# Patient Record
Sex: Male | Born: 1975 | Race: White | Hispanic: No | Marital: Married
Health system: Midwestern US, Community
[De-identification: ages and names within clinical notes are randomized; demographics above are authoritative.]

## PROBLEM LIST (undated history)

## (undated) DIAGNOSIS — J45909 Unspecified asthma, uncomplicated: Secondary | ICD-10-CM

## (undated) DIAGNOSIS — I739 Peripheral vascular disease, unspecified: Secondary | ICD-10-CM

## (undated) DIAGNOSIS — D689 Coagulation defect, unspecified: Secondary | ICD-10-CM

## (undated) DIAGNOSIS — E119 Type 2 diabetes mellitus without complications: Secondary | ICD-10-CM

## (undated) DIAGNOSIS — G709 Myoneural disorder, unspecified: Secondary | ICD-10-CM

## (undated) DIAGNOSIS — Z832 Family history of diseases of the blood and blood-forming organs and certain disorders involving the immune mechanism: Secondary | ICD-10-CM

## (undated) DIAGNOSIS — Z5189 Encounter for other specified aftercare: Secondary | ICD-10-CM

## (undated) DIAGNOSIS — D6851 Activated protein C resistance: Secondary | ICD-10-CM

## (undated) HISTORY — PX: APPENDECTOMY: SHX54

## (undated) HISTORY — DX: Type 2 diabetes mellitus without complications: E11.9

## (undated) HISTORY — PX: COLONOSCOPY: SHX174

## (undated) HISTORY — PX: WISDOM TOOTH EXTRACTION: SHX21

## (undated) HISTORY — DX: Myoneural disorder, unspecified: G70.9

## (undated) HISTORY — DX: Coagulation defect, unspecified: D68.9

## (undated) HISTORY — DX: Activated protein C resistance: D68.51

## (undated) HISTORY — DX: Encounter for other specified aftercare: Z51.89

---

## 2004-02-27 ENCOUNTER — Emergency Department (HOSPITAL_COMMUNITY): Admission: EM | Admit: 2004-02-27 | Discharge: 2004-02-27 | Payer: Self-pay | Admitting: Emergency Medicine

## 2006-08-31 ENCOUNTER — Emergency Department (HOSPITAL_COMMUNITY): Admission: EM | Admit: 2006-08-31 | Discharge: 2006-08-31 | Payer: Self-pay | Admitting: Emergency Medicine

## 2007-02-03 ENCOUNTER — Emergency Department (HOSPITAL_COMMUNITY): Admission: EM | Admit: 2007-02-03 | Discharge: 2007-02-03 | Payer: Self-pay | Admitting: Emergency Medicine

## 2008-12-30 ENCOUNTER — Emergency Department (HOSPITAL_COMMUNITY): Admission: EM | Admit: 2008-12-30 | Discharge: 2008-12-30 | Payer: Self-pay | Admitting: Emergency Medicine

## 2010-01-13 ENCOUNTER — Emergency Department (HOSPITAL_COMMUNITY): Admission: EM | Admit: 2010-01-13 | Discharge: 2010-01-13 | Payer: Self-pay | Admitting: Emergency Medicine

## 2010-05-25 IMAGING — CR DG LUMBAR SPINE COMPLETE 4+V
5 series · 5 of 5 positions shown · non-contrast
Comparison: None.

CLINICAL DATA: Low back pain following a lifting injury.

LUMBAR SPINE - COMPLETE 4+ VIEW

[view not recorded (1 of 5)]
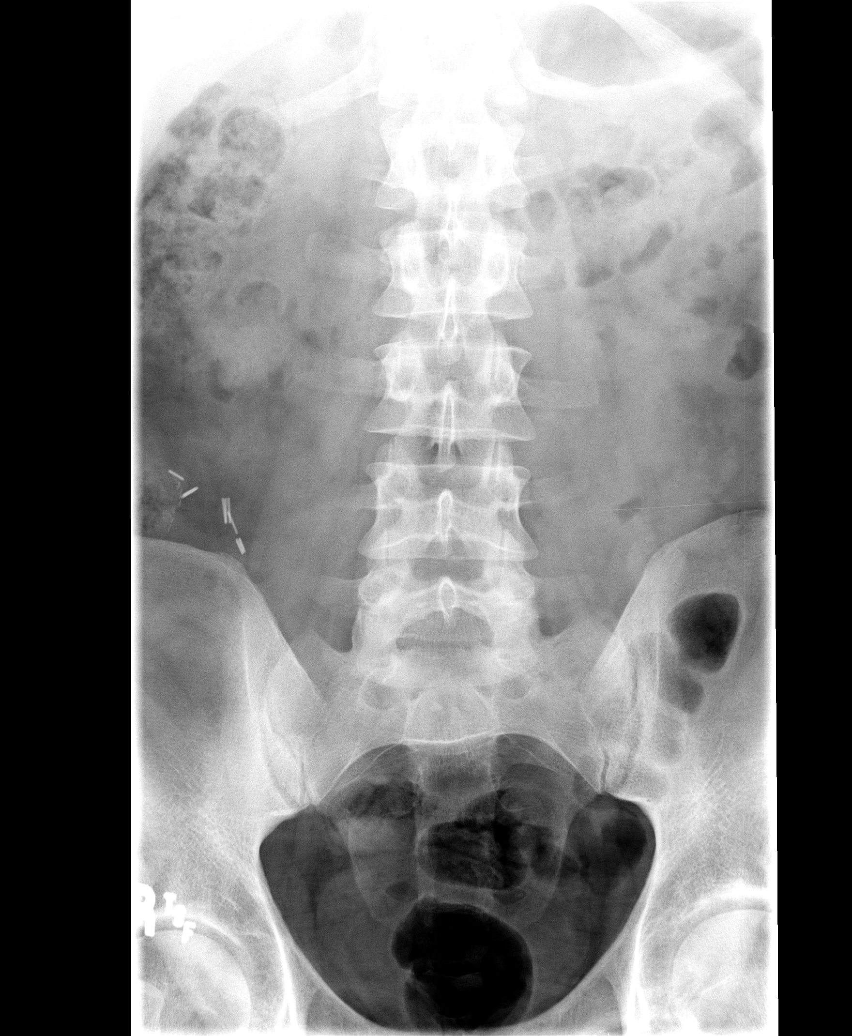

[view not recorded (2 of 5)]
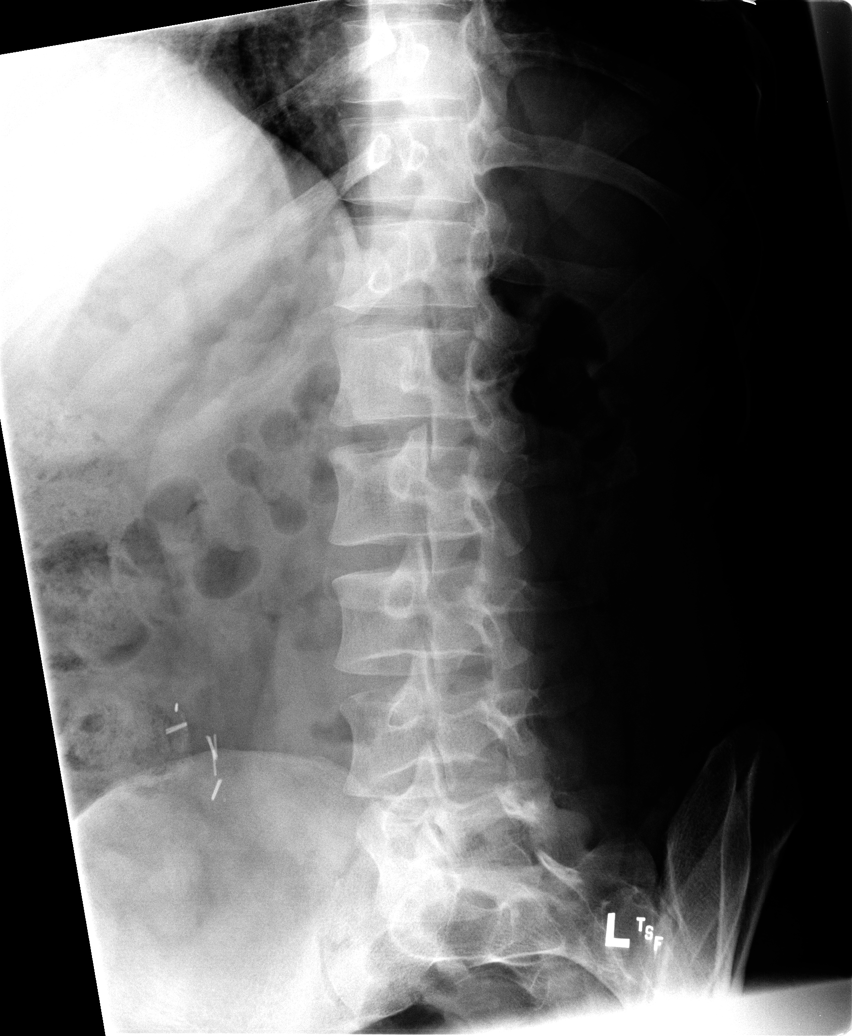

[view not recorded (3 of 5)]
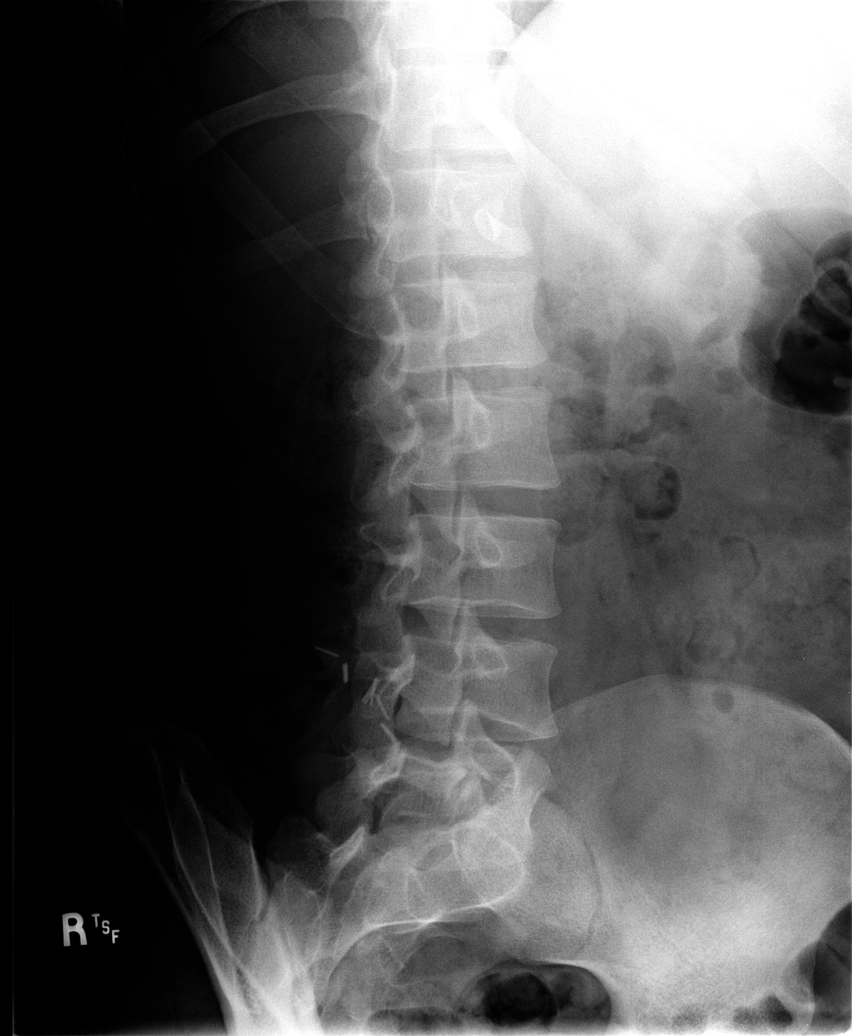

[view not recorded (4 of 5)]
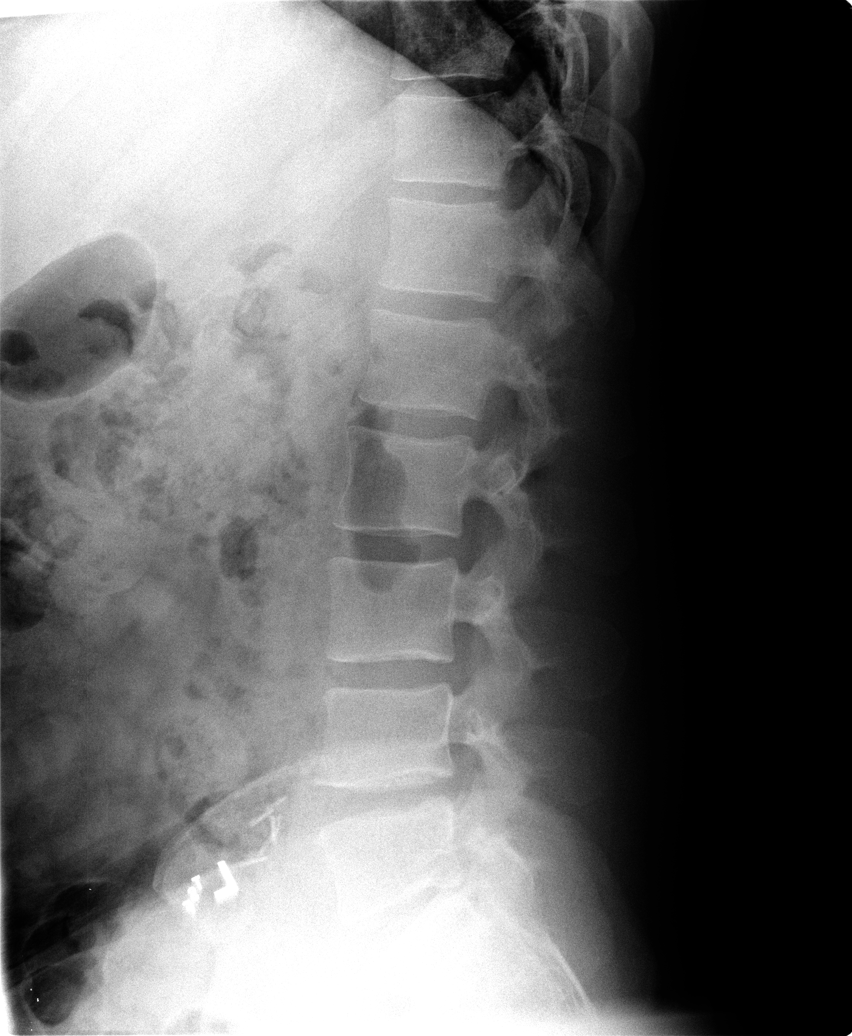

[view not recorded (5 of 5)]
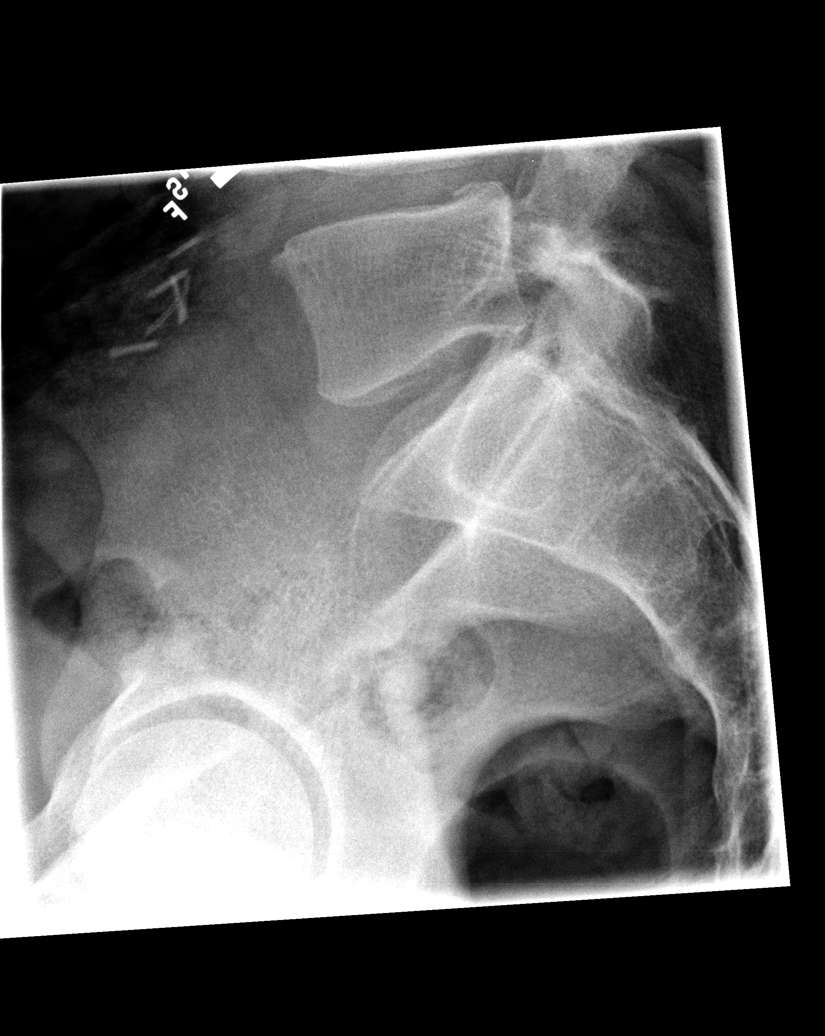

[5 of 5 positions shown; findings below may reference images not displayed]

FINDINGS: Five non-rib bearing lumbar vertebrae.  Mild anterior
spur formation at the L4-5 level.  No fractures, pars defects or
subluxations.  Right lower quadrant abdominal surgical clips.
IMPRESSION: No fracture or subluxation.

## 2013-05-21 ENCOUNTER — Encounter: Payer: Self-pay | Admitting: Family Medicine

## 2013-05-21 ENCOUNTER — Ambulatory Visit (INDEPENDENT_AMBULATORY_CARE_PROVIDER_SITE_OTHER): Payer: BC Managed Care – PPO | Admitting: Family Medicine

## 2013-05-21 ENCOUNTER — Ambulatory Visit (INDEPENDENT_AMBULATORY_CARE_PROVIDER_SITE_OTHER): Payer: BC Managed Care – PPO

## 2013-05-21 ENCOUNTER — Encounter (INDEPENDENT_AMBULATORY_CARE_PROVIDER_SITE_OTHER): Payer: Self-pay

## 2013-05-21 VITALS — BP 134/85 | HR 78 | Temp 98.4°F | Ht 73.5 in | Wt 241.0 lb

## 2013-05-21 DIAGNOSIS — M25519 Pain in unspecified shoulder: Secondary | ICD-10-CM

## 2013-05-21 DIAGNOSIS — M25511 Pain in right shoulder: Secondary | ICD-10-CM

## 2013-05-21 MED ORDER — NAPROXEN 500 MG PO TABS: 500.0000 mg | ORAL_TABLET | Freq: Two times a day (BID) | ORAL | Status: DC

## 2013-05-21 MED ORDER — KETOROLAC TROMETHAMINE 30 MG/ML IJ SOLN
30.0000 mg | Freq: Once | INTRAMUSCULAR | Status: AC
  Administered 2013-05-21: 30 mg via INTRAMUSCULAR

## 2013-05-21 NOTE — Addendum Note (Signed)
Addended by: Bearl Mulberry on: 05/21/2013 05:23 PM   Modules accepted: Orders

## 2013-05-21 NOTE — Progress Notes (Signed)
  Subjective:    Patient ID: Bryan Hull, male    DOB: April 10, 1976, 37 y.o.   MRN: [id number]  HPI This 37 y.o. male presents for evaluation of right shoulder pain.  He states he has  Hx of right shoulder injury in the past and he has been having some persistent right Shoulder pain and he has a knot on his right shoulder that is tender.  He states the pain In his shoulder bothers him at night and he is getting aggravated by the discomfort.   Review of Systems C/o right shoulder pain. No chest pain, SOB, HA, dizziness, vision change, N/V, diarrhea, constipation, dysuria, urinary urgency or frequency or rash.     Objective:   Physical Exam Vital signs noted  Well developed well nourished male.  HEENT - Head atraumatic Normocephalic Respiratory - Lungs CTA bilateral Cardiac - RRR S1 and S2 without murmur GI - Abdomen soft Nontender and bowel sounds active x 4 Extremities - No edema. Neuro - Grossly intact. MS -  TTP right shoulder at [facility] joint.  AC joint with swelling.   Xray right shoulder - No acute fracture and no dislocation.    Assessment & Plan:  Right shoulder pain - Plan: DG Shoulder Right Toradol 60mg  IM Naprosyn 500mg  one po bid x 10 days #30  Follow up prn.  Deatra Canter FNP

## 2013-05-21 NOTE — Patient Instructions (Signed)
Shoulder Pain  The shoulder is the joint that connects your arms to your body. The bones that form the shoulder joint include the upper arm bone (humerus), the shoulder blade (scapula), and the collarbone (clavicle). The top of the humerus is shaped like a ball and fits into a rather flat socket on the scapula (glenoid cavity). A combination of muscles and strong, fibrous tissues that connect muscles to bones (tendons) support your shoulder joint and hold the ball in the socket. Small, fluid-filled sacs (bursae) are located in different areas of the joint. They act as cushions between the bones and the overlying soft tissues and help reduce friction between the gliding tendons and the bone as you move your arm. Your shoulder joint allows a wide range of motion in your arm. This range of motion allows you to do things like scratch your back or throw a ball. However, this range of motion also makes your shoulder more prone to pain from overuse and injury.  Causes of shoulder pain can originate from both injury and overuse and usually can be grouped in the following four categories:   Redness, swelling, and pain (inflammation) of the tendon (tendinitis) or the bursae (bursitis).   Instability, such as a dislocation of the joint.   Inflammation of the joint (arthritis).   Broken bone (fracture).  HOME CARE INSTRUCTIONS    Apply ice to the sore area.   Put ice in a plastic bag.   Place a towel between your skin and the bag.   Leave the ice on for 15-20 minutes, 3-4 times per day for the first 2 days.   Stop using cold packs if they do not help with the pain.   If you have a shoulder sling or immobilizer, wear it as long as your caregiver instructs. Only remove it to shower or bathe. Move your arm as little as possible, but keep your hand moving to prevent swelling.   Squeeze a soft ball or foam pad as much as possible to help prevent swelling.   Only take over-the-counter or prescription medicines for pain,  discomfort, or fever as directed by your caregiver.  SEEK MEDICAL CARE IF:    Your shoulder pain increases, or new pain develops in your arm, hand, or fingers.   Your hand or fingers become cold and numb.   Your pain is not relieved with medicines.  SEEK IMMEDIATE MEDICAL CARE IF:    Your arm, hand, or fingers are numb or tingling.   Your arm, hand, or fingers are significantly swollen or turn white or blue.  MAKE SURE YOU:    Understand these instructions.   Will watch your condition.   Will get help right away if you are not doing well or get worse.  Document Released: 04/24/2005 Document Revised: 04/08/2012 Document Reviewed: 06/29/2011  [employer] Patient Information 2014 [employer].

## 2013-05-28 ENCOUNTER — Telehealth: Payer: Self-pay | Admitting: Family Medicine

## 2013-05-31 NOTE — Telephone Encounter (Signed)
PT SAID NOT W/C. NEEDS PAPER FILLED OUT TO RETURN TO WORK. HAS THIS BEEN DONE?

## 2021-05-08 ENCOUNTER — Inpatient Hospital Stay
Admit: 2021-05-08 | Discharge: 2021-05-08 | Disposition: A | Discharge status: Home / Self Care | Payer: MEDICAID | Attending: Emergency Medicine

## 2021-05-08 DIAGNOSIS — G8929 Other chronic pain: Secondary | ICD-10-CM

## 2021-05-08 MED ORDER — METHYLPREDNISOLONE 40 MG/ML SUSP FOR INJECTION
40 mg/mL | Freq: Once | INTRAMUSCULAR | Status: AC
Start: 2021-05-08 — End: 2021-05-08
  Administered 2021-05-08: 13:00:00 via INTRAMUSCULAR

## 2021-05-08 MED ORDER — IBUPROFEN 800 MG TAB
800 mg | ORAL_TABLET | Freq: Three times a day (TID) | ORAL | 0 refills | Status: AC | PRN
Start: 2021-05-08 — End: 2021-05-15

## 2021-05-08 MED FILL — METHYLPREDNISOLONE 40 MG/ML SUSP FOR INJECTION: 40 mg/mL | INTRAMUSCULAR | Qty: 2

## 2021-05-08 NOTE — ED Provider Notes (Signed)
HPI 45 year old male with no significant medical history presents the ED complaining of multiple joint pains involving hips knees ankles primarily though some stiffness also noted in his shoulders.  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  No prior history of rheumatoid arthritis or other recognized connective tissue disorder.  No family history of connective tissue disorder polyarthropathies.  Still no fever no trauma.  Patient has had occasional flareups like this in the past though this pain seems more severe and ongoing now for approximately 10 days.  No respiratory GI GU symptoms.    No past medical history on file.    No past surgical history on file.      No family history on file.    Social History     Socioeconomic History    Marital status: MARRIED     Spouse name: Not on file    Number of children: Not on file    Years of education: Not on file    Highest education level: Not on file   Occupational History    Not on file   Tobacco Use    Smoking status: Not on file    Smokeless tobacco: Not on file   Substance and Sexual Activity    Alcohol use: Not on file    Drug use: Not on file    Sexual activity: Not on file   Other Topics Concern    Not on file   Social History Narrative    Not on file     Social Determinants of Health     Financial Resource Strain: Not on file   Food Insecurity: Not on file   Transportation Needs: Not on file   Physical Activity: Not on file   Stress: Not on file   Social Connections: Not on file   Intimate Partner Violence: Not on file   Housing Stability: Not on file         ALLERGIES: Patient has no known allergies.    Review of Systems   Constitutional:  Negative for appetite change, chills, diaphoresis and fever.   HENT:  Negative for ear pain, facial swelling, sinus pain and trouble swallowing.    Eyes:  Negative for redness and visual disturbance.   Respiratory:  Negative for cough, choking, chest tightness, shortness of  breath, wheezing and stridor.    Cardiovascular:  Negative for chest pain, palpitations and leg swelling.   Gastrointestinal:  Negative for abdominal distention, abdominal pain, blood in stool, diarrhea, nausea and vomiting.   Endocrine: Negative for polydipsia and polyuria.   Genitourinary:  Negative for difficulty urinating, dysuria, flank pain, frequency, hematuria and urgency.   Musculoskeletal:  Positive for arthralgias. Negative for back pain, gait problem, joint swelling, myalgias, neck pain and neck stiffness.   Allergic/Immunologic: Negative.    Neurological: Negative.    Psychiatric/Behavioral: Negative.       Vitals:    05/08/21 0911   BP: (!) 142/88   Pulse: 74   Resp: 16   Temp: 98.1 ??F (36.7 ??C)   SpO2: 99%   Weight: 99.8 kg (220 lb)   Height: 5\' 8"  (1.727 m)            Physical Exam  Vitals and nursing note reviewed.   Constitutional:       General: He is not in acute distress.     Appearance: Normal appearance. He is not ill-appearing, toxic-appearing or diaphoretic.  HENT:      Head: Normocephalic and atraumatic.      Nose: Nose normal.      Mouth/Throat:      Mouth: Mucous membranes are moist.   Eyes:      Extraocular Movements: Extraocular movements intact.      Conjunctiva/sclera: Conjunctivae normal.   Cardiovascular:      Rate and Rhythm: Normal rate and regular rhythm.      Pulses: Normal pulses.      Heart sounds: Normal heart sounds. No murmur heard.  Pulmonary:      Effort: Pulmonary effort is normal. No respiratory distress.      Breath sounds: Normal breath sounds. No wheezing, rhonchi or rales.   Abdominal:      General: Bowel sounds are normal. There is no distension.      Palpations: Abdomen is soft. There is no mass.      Tenderness: There is no abdominal tenderness. There is no right CVA tenderness, left CVA tenderness, guarding or rebound.      Hernia: No hernia is present.   Musculoskeletal:         General: Tenderness present. No swelling, deformity or signs of injury. Normal  range of motion.      Cervical back: Normal range of motion and neck supple. No rigidity or tenderness.      Right lower leg: No edema.      Left lower leg: No edema.      Comments: Close examination of the shoulders hips knees and ankles showed no evidence of joint effusion or active joint inflammation; however there are tenderness of the lateral hips bilaterally pain with flexion internal and external rotation and minor tenderness to the Achilles tendon bilaterally; no warmth or skin changes is noted no palpable crepitus   Lymphadenopathy:      Cervical: No cervical adenopathy.   Skin:     General: Skin is warm and dry.      Capillary Refill: Capillary refill takes less than 2 seconds.      Findings: No erythema, lesion or rash.   Neurological:      General: No focal deficit present.      Mental Status: He is alert and oriented to person, place, and time. Mental status is at baseline.      Cranial Nerves: No cranial nerve deficit.      Sensory: No sensory deficit.      Motor: No weakness.      Coordination: Coordination normal.   Psychiatric:         Mood and Affect: Mood normal.         Behavior: Behavior normal.         Thought Content: Thought content normal.        MDM  Diffuse arthralgias and minor findings of inflammation of the joints and tendons.  Clinically find no sign of a systemic connective tissue disorder though further work-up is warranted if patient does not respond ibuprofen and IM Depo-Medrol strongly encouraged him to seek rheumatology evaluation if symptoms persist.    We will treat with IM Depo-Medrol and Motrin 800 twice daily to 3 times daily       Procedures

## 2021-05-08 NOTE — ED Notes (Signed)
Patient reports pain in all of his joint in the lower part of his body.  Reports sx present x2 weeks & just got worse.

## 2021-05-08 NOTE — ED Triage Notes (Signed)
Formatting of this note might be different from the original.  Patient reports pain in all of his joint in the lower part of his body.  Reports sx present x2 weeks & just got worse.    Electronically signed by Jones Bales, RN at 05/08/2021  9:11 AM EDT

## 2021-05-08 NOTE — ED Provider Notes (Signed)
Formatting of this note is different from the original.  HPI 45 year old male with no significant medical history presents the ED complaining of multiple joint pains involving hips knees ankles primarily though some stiffness also noted in his shoulders.  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  No prior history of rheumatoid arthritis or other recognized connective tissue disorder.  No family history of connective tissue disorder polyarthropathies.  Still no fever no trauma.  Patient has had occasional flareups like this in the past though this pain seems more severe and ongoing now for approximately 10 days.  No respiratory GI GU symptoms.    No past medical history on file.    No past surgical history on file.    No family history on file.    Social History     Socioeconomic History    Marital status: MARRIED     Spouse name: Not on file    Number of children: Not on file    Years of education: Not on file    Highest education level: Not on file   Occupational History    Not on file   Tobacco Use    Smoking status: Not on file    Smokeless tobacco: Not on file   Substance and Sexual Activity    Alcohol use: Not on file    Drug use: Not on file    Sexual activity: Not on file   Other Topics Concern    Not on file   Social History Narrative    Not on file     Social Determinants of Health     Financial Resource Strain: Not on file   Food Insecurity: Not on file   Transportation Needs: Not on file   Physical Activity: Not on file   Stress: Not on file   Social Connections: Not on file   Intimate Partner Violence: Not on file   Housing Stability: Not on file     ALLERGIES: Patient has no known allergies.    Review of Systems   Constitutional:  Negative for appetite change, chills, diaphoresis and fever.   HENT:  Negative for ear pain, facial swelling, sinus pain and trouble swallowing.    Eyes:  Negative for redness and visual disturbance.   Respiratory:   Negative for cough, choking, chest tightness, shortness of breath, wheezing and stridor.    Cardiovascular:  Negative for chest pain, palpitations and leg swelling.   Gastrointestinal:  Negative for abdominal distention, abdominal pain, blood in stool, diarrhea, nausea and vomiting.   Endocrine: Negative for polydipsia and polyuria.   Genitourinary:  Negative for difficulty urinating, dysuria, flank pain, frequency, hematuria and urgency.   Musculoskeletal:  Positive for arthralgias. Negative for back pain, gait problem, joint swelling, myalgias, neck pain and neck stiffness.   Allergic/Immunologic: Negative.    Neurological: Negative.    Psychiatric/Behavioral: Negative.       Vitals:    05/08/21 0911   BP: (!) 142/88   Pulse: 74   Resp: 16   Temp: 98.1 F (36.7 C)   SpO2: 99%   Weight: 99.8 kg (220 lb)   Height: 5\' 8"  (1.727 m)       Physical Exam  Vitals and nursing note reviewed.   Constitutional:       General: He is not in acute distress.     Appearance: Normal appearance. He is not ill-appearing, toxic-appearing or diaphoretic.   HENT:  Head: Normocephalic and atraumatic.      Nose: Nose normal.      Mouth/Throat:      Mouth: Mucous membranes are moist.   Eyes:      Extraocular Movements: Extraocular movements intact.      Conjunctiva/sclera: Conjunctivae normal.   Cardiovascular:      Rate and Rhythm: Normal rate and regular rhythm.      Pulses: Normal pulses.      Heart sounds: Normal heart sounds. No murmur heard.  Pulmonary:      Effort: Pulmonary effort is normal. No respiratory distress.      Breath sounds: Normal breath sounds. No wheezing, rhonchi or rales.   Abdominal:      General: Bowel sounds are normal. There is no distension.      Palpations: Abdomen is soft. There is no mass.      Tenderness: There is no abdominal tenderness. There is no right CVA tenderness, left CVA tenderness, guarding or rebound.      Hernia: No hernia is present.   Musculoskeletal:         General: Tenderness present.  No swelling, deformity or signs of injury. Normal range of motion.      Cervical back: Normal range of motion and neck supple. No rigidity or tenderness.      Right lower leg: No edema.      Left lower leg: No edema.      Comments: Close examination of the shoulders hips knees and ankles showed no evidence of joint effusion or active joint inflammation; however there are tenderness of the lateral hips bilaterally pain with flexion internal and external rotation and minor tenderness to the Achilles tendon bilaterally; no warmth or skin changes is noted no palpable crepitus   Lymphadenopathy:      Cervical: No cervical adenopathy.   Skin:     General: Skin is warm and dry.      Capillary Refill: Capillary refill takes less than 2 seconds.      Findings: No erythema, lesion or rash.   Neurological:      General: No focal deficit present.      Mental Status: He is alert and oriented to person, place, and time. Mental status is at baseline.      Cranial Nerves: No cranial nerve deficit.      Sensory: No sensory deficit.      Motor: No weakness.      Coordination: Coordination normal.   Psychiatric:         Mood and Affect: Mood normal.         Behavior: Behavior normal.         Thought Content: Thought content normal.       MDM  Diffuse arthralgias and minor findings of inflammation of the joints and tendons.  Clinically find no sign of a systemic connective tissue disorder though further work-up is warranted if patient does not respond ibuprofen and IM Depo-Medrol strongly encouraged him to seek rheumatology evaluation if symptoms persist.    We will treat with IM Depo-Medrol and Motrin 800 twice daily to 3 times daily      Procedures          Electronically signed by Addison Lank, MD at 05/08/2021  9:25 AM EDT

## 2022-02-14 ENCOUNTER — Ambulatory Visit (INDEPENDENT_AMBULATORY_CARE_PROVIDER_SITE_OTHER): Payer: 59 | Admitting: Physician Assistant

## 2022-02-14 ENCOUNTER — Encounter: Payer: Self-pay | Admitting: Physician Assistant

## 2022-02-14 VITALS — BP 133/88 | HR 80 | Temp 97.9°F | Ht 73.0 in | Wt 228.4 lb

## 2022-02-14 DIAGNOSIS — I739 Peripheral vascular disease, unspecified: Secondary | ICD-10-CM | POA: Diagnosis not present

## 2022-02-14 DIAGNOSIS — E1140 Type 2 diabetes mellitus with diabetic neuropathy, unspecified: Secondary | ICD-10-CM | POA: Diagnosis not present

## 2022-02-14 DIAGNOSIS — F17210 Nicotine dependence, cigarettes, uncomplicated: Secondary | ICD-10-CM

## 2022-02-14 DIAGNOSIS — E119 Type 2 diabetes mellitus without complications: Secondary | ICD-10-CM

## 2022-02-14 LAB — POCT GLYCOSYLATED HEMOGLOBIN (HGB A1C): Hemoglobin A1C: 9.6 % — AB (ref 4.0–5.6)

## 2022-02-14 MED ORDER — PREGABALIN 25 MG PO CAPS: 25.0000 mg | ORAL_CAPSULE | Freq: Two times a day (BID) | ORAL | 0 refills | Status: DC

## 2022-02-14 MED ORDER — METFORMIN HCL 500 MG PO TABS: 500.0000 mg | ORAL_TABLET | Freq: Every day | ORAL | 0 refills | Status: DC

## 2022-02-14 MED ORDER — GLIPIZIDE ER 5 MG PO TB24: 5.0000 mg | ORAL_TABLET | Freq: Every day | ORAL | 0 refills | Status: DC

## 2022-02-14 NOTE — Progress Notes (Signed)
Subjective:    Patient ID: Bryan Hull, male    DOB: 07/15/1976, 46 y.o.   MRN: [id number]  Chief Complaint  Patient presents with   Establish Care    Pt is having issues with legs, has lost 25 lbs in last 4 months and lost muscle mass in both legs, having pain in BIL LE with numbness, pt thinks he may be diabetic; unable to take longs walks like usual, [place]; pt is not fasting today; not eaten since last night however blood sugar earlier was 293; in the past month had issues with sleep; dbl and blurred vision in left eye; fatigue and weakness    HPI 46 y.o. patient presents today for new patient establishment with me.  Patient was previously established with a provider at [place] Team: None   Acute Concerns: Last 7-8 months - Heavy, pain feeling in both legs, can't go more than 40 yards without having to stop and rest; heavy feeling in upper thighs. Used to wear lidocaine patch and it would help, now this does nothing.  Glucose readings at home:  314 highest reading  199 lowest reading Drinks a lot of [employer]   Smokes approx 1 ppd for the last 24 years    History reviewed. No pertinent past medical history.  Past Surgical History:  Procedure Laterality Date   APPENDECTOMY      Family History  Problem Relation Age of Onset   Diabetes Father    Hyperlipidemia Father     Social History   Tobacco Use   Smoking status: Every Day    Packs/day: 1.00    Types: Cigarettes    Start date: 05/21/1993  Vaping Use   Vaping Use: Never used  Substance Use Topics   Alcohol use: No   Drug use: Yes    Types: Marijuana     No Known Allergies  Review of Systems NEGATIVE UNLESS OTHERWISE INDICATED IN HPI      Objective:     BP 133/88 (BP Location: Left Arm)   Pulse 80   Temp 97.9 F (36.6 C) (Temporal)   Ht '6\' 1"'$  (1.854 m)   Wt 228 lb 6.4 oz (103.6 kg)   SpO2 98%   BMI 30.13 kg/m   Wt Readings from Last  3 Encounters:  02/14/22 228 lb 6.4 oz (103.6 kg)  05/21/13 241 lb (109.3 kg)    BP Readings from Last 3 Encounters:  02/14/22 133/88  05/21/13 134/85     Physical Exam Vitals and nursing note reviewed.  Constitutional:      General: He is not in acute distress.    Appearance: Normal appearance. He is not toxic-appearing.  HENT:     Head: Normocephalic and atraumatic.     Right Ear: Tympanic membrane, ear canal and external ear normal.     Left Ear: Tympanic membrane, ear canal and external ear normal.     Nose: Nose normal.     Mouth/Throat:     Mouth: Mucous membranes are moist.     Pharynx: Oropharynx is clear.  Eyes:     Extraocular Movements: Extraocular movements intact.     Conjunctiva/sclera: Conjunctivae normal.     Pupils: Pupils are equal, round, and reactive to light.  Cardiovascular:     Rate and Rhythm: Normal rate and regular rhythm.     Pulses:          Dorsalis pedis pulses are 1+ on  the right side and 1+ on the left side.       Posterior tibial pulses are 1+ on the right side and 1+ on the left side.     Heart sounds: Normal heart sounds.  Pulmonary:     Effort: Pulmonary effort is normal.     Breath sounds: Normal breath sounds.  Abdominal:     General: Abdomen is flat. Bowel sounds are normal.     Palpations: Abdomen is soft.     Tenderness: There is no abdominal tenderness.  Musculoskeletal:        General: Normal range of motion.     Cervical back: Normal range of motion and neck supple.     Right lower leg: No edema.     Left lower leg: No edema.  Skin:    General: Skin is warm and dry.  Neurological:     General: No focal deficit present.     Mental Status: He is alert and oriented to person, place, and time.     Comments: Monofilament decreased bilateral feet, ankles, lower legs  Psychiatric:        Mood and Affect: Mood normal.        Behavior: Behavior normal.        Assessment & Plan:   Problem List Items Addressed This Visit    None Visit Diagnoses     New onset type 2 diabetes mellitus ([place])    -  Primary   Relevant Medications   metFORMIN (GLUCOPHAGE) 500 MG tablet   glipiZIDE (GLUCOTROL XL) 5 MG 24 hr tablet   Other Relevant Orders   POCT HgB A1C (Completed)   VAS [place] ABI WITH/WO TBI   Type 2 diabetes mellitus with diabetic neuropathy, without long-term current use of insulin ([facility])       Relevant Medications   metFORMIN (GLUCOPHAGE) 500 MG tablet   glipiZIDE (GLUCOTROL XL) 5 MG 24 hr tablet   Other Relevant Orders   VAS [place] ABI WITH/WO TBI   Claudication of both lower extremities ([facility])       Relevant Medications   pregabalin (LYRICA) 25 MG capsule   Other Relevant Orders   VAS [place] ABI WITH/WO TBI   Cigarette nicotine dependence without complication       Relevant Orders   VAS [place] ABI WITH/WO TBI        Meds ordered this encounter  Medications   metFORMIN (GLUCOPHAGE) 500 MG tablet    Sig: Take 1 tablet (500 mg total) by mouth daily with breakfast.    Dispense:  30 tablet    Refill:  0    Order Specific Question:   Supervising Provider    Answer:   Marin Olp [[id number]]   glipiZIDE (GLUCOTROL XL) 5 MG 24 hr tablet    Sig: Take 1 tablet (5 mg total) by mouth daily with breakfast.    Dispense:  30 tablet    Refill:  0    Order Specific Question:   Supervising Provider    Answer:   Marin Olp [[id number]]   pregabalin (LYRICA) 25 MG capsule    Sig: Take 1 capsule (25 mg total) by mouth 2 (two) times daily.    Dispense:  60 capsule    Refill:  0    Order Specific Question:   Supervising Provider    Answer:   Marin Olp [[id number]]   Plan: Pleasant new pt establishment  1. New onset type 2 diabetes mellitus ([place]) Lab Results  Component Value Date   HGBA1C 9.6 (A) 02/14/2022   I have discussed with the patient the pathophysiology of diabetes. We went over the natural progression of the disease. We talked about both insulin resistance and insulin deficiency. We stressed the  importance of lifestyle changes including diet and exercise. I explained the complications associated with diabetes including retinopathy, nephropathy, neuropathy as well as increased risk of cardiovascular disease. We went over the benefit seen with glycemic control.   I explained to the patient that diabetic patients are at higher than normal risk for amputations. The patient was informed that diabetes is the number one cause of non-traumatic amputations in [place]. The patient was advised to look and examine their feet , wear proper fitting shoes and not to go barefoot.   Plan to start on Metformin and Glipizide as directed. Pt aware of risks vs benefits and possible adverse reactions.  Lifestyle changes advised.   2. Type 2 diabetes mellitus with diabetic neuropathy, without long-term current use of insulin ([place]) Start on Lyrica 25 mg BID, Pt aware of risks vs benefits and possible adverse reactions.  Keep working on getting glucose levels under control.  3. Claudication of both lower extremities ([place]) Need to schedule for ABI both lower legs.  4. Cigarette nicotine dependence without complication Hugely complicating factor to his health, esp with new diabetes diagnosis Strongly encouraged pt to quit smoking    Return in about 4 weeks (around 03/14/2022) for med recheck.  This note was prepared with assistance of Systems analyst. Occasional wrong-word or sound-a-like substitutions may have occurred due to the inherent limitations of voice recognition software.   Blanchie Zeleznik M Deandrae Wajda, PA-C

## 2022-02-14 NOTE — Patient Instructions (Addendum)
Welcome to [facility]! It was a pleasure meeting you today.  Unfortunately, your Ha1c is 9.6%, indicating diabetes.  Plan to start on Metformin and glipizide as discussed.  Lyrica for neuropathic leg pain. Try to cut sodas out of diet.   Someone will call to schedule scan of your legs looking for blood flow issues.  Please consider ways to stop smoking.   Looking forward to working with you!   As discussed, Please schedule a 1 month follow up visit today.  PLEASE NOTE:  If you had any LAB tests please let us know if you have not heard back within a few days. You may see your results on MyChart before we have a chance to review them but we will give you a call once they are reviewed by [place]. If we ordered any REFERRALS today, please let us know if you have not heard from their office within the next two weeks. Let us know through MyChart if you are needing REFILLS, or have your pharmacy send [place] the request. You can also use MyChart to communicate with me or any office staff.  Please try these tips to maintain a healthy lifestyle:  Eat most of your calories during the day when you are active. Eliminate processed foods including packaged sweets (pies, cakes, cookies), reduce intake of potatoes, white bread, white pasta, and white rice. Look for whole grain options, oat flour or almond flour.  Each meal should contain half fruits/vegetables, one quarter protein, and one quarter carbs (no bigger than a computer mouse).  Cut down on sweet beverages. This includes juice, soda, and sweet tea. Also watch fruit intake, though this is a healthier sweet option, it still contains natural sugar! Limit to 3 servings daily.  Drink at least 1 glass of water with each meal and aim for at least 8 glasses (64 ounces) per day.  Exercise at least 150 minutes every week to the best of your ability.    Take Care,  Keldric Poyer, PA-C

## 2022-02-25 ENCOUNTER — Other Ambulatory Visit: Payer: Self-pay

## 2022-02-25 ENCOUNTER — Ambulatory Visit (HOSPITAL_COMMUNITY)
Admission: RE | Admit: 2022-02-25 | Discharge: 2022-02-25 | Disposition: A | Discharge status: Home / Self Care | Payer: 59 | Source: Ambulatory Visit | Attending: Physician Assistant | Admitting: Physician Assistant

## 2022-02-25 DIAGNOSIS — I739 Peripheral vascular disease, unspecified: Secondary | ICD-10-CM | POA: Diagnosis present

## 2022-02-25 DIAGNOSIS — E1140 Type 2 diabetes mellitus with diabetic neuropathy, unspecified: Secondary | ICD-10-CM | POA: Diagnosis present

## 2022-02-25 DIAGNOSIS — E119 Type 2 diabetes mellitus without complications: Secondary | ICD-10-CM | POA: Insufficient documentation

## 2022-02-25 DIAGNOSIS — F17210 Nicotine dependence, cigarettes, uncomplicated: Secondary | ICD-10-CM | POA: Diagnosis present

## 2022-03-01 ENCOUNTER — Encounter: Payer: Self-pay | Admitting: Vascular Surgery

## 2022-03-01 ENCOUNTER — Ambulatory Visit (INDEPENDENT_AMBULATORY_CARE_PROVIDER_SITE_OTHER): Payer: 59 | Admitting: Vascular Surgery

## 2022-03-01 VITALS — BP 129/87 | HR 69 | Temp 98.3°F | Resp 20 | Ht 73.0 in | Wt 226.0 lb

## 2022-03-01 DIAGNOSIS — I739 Peripheral vascular disease, unspecified: Secondary | ICD-10-CM | POA: Diagnosis not present

## 2022-03-01 MED ORDER — ATORVASTATIN CALCIUM 40 MG PO TABS: 40.0000 mg | ORAL_TABLET | Freq: Every day | ORAL | 3 refills | Status: DC

## 2022-03-01 NOTE — Progress Notes (Signed)
Office Note     CC:  Abnormal ABI Requesting Provider:  [name], Randa Evens, PA-C  HPI: Bryan Hull is a 46 y.o. (24-Nov-1975) male presenting at the request of .[name], [name], PA-C abnormal ABI with left lower extremity claudication.  On exam, Bryan Hull was nervous.  A native of [place], he works full-time as a Administrator.  He has a history of right lower extremity DVT, provoked from a long haul over 10 years ago.  In November of last year, he noted left lower extremity pain that has worsened over the last 6 to 8 months.  He described the pain as cramping and weakness that occurs after roughly 40 yards.  He stated he could barely make it into our office without significant left leg cramping and pain.  He also noted bilateral lower extremity neuropathy, described as pins-and-needles and lightening shooting into the feet.  Over the last 6 to 8 months Bryan Hull has had 40 pounds of weight loss.  He has not been trying to lose weight.  He continues to smoke roughly 1 pack/day.  He denies rest pain, tissue loss in the feet.  Bryan Hull was recently diagnosed with diabetes Medication includes daily aspirin, no statin  Past Medical History:  Diagnosis Date   Diabetes mellitus without complication ([place])     Past Surgical History:  Procedure Laterality Date   APPENDECTOMY      Social History   Socioeconomic History   Marital status: Married    Spouse name: Not on file   Number of children: 2   Years of education: Not on file   Highest education level: Not on file  Occupational History   Not on file  Tobacco Use   Smoking status: Every Day    Packs/day: 1.00    Types: Cigarettes    Start date: 05/21/1993   Smokeless tobacco: Not on file  Vaping Use   Vaping Use: Never used  Substance and Sexual Activity   Alcohol use: No   Drug use: Yes    Types: Marijuana   Sexual activity: Yes    Birth control/protection: None  Other Topics Concern   Not on file  Social History Narrative   Not  on file   Social Determinants of Health   Financial Resource Strain: Not on file  Food Insecurity: Not on file  Transportation Needs: Not on file  Physical Activity: Not on file  Stress: Not on file  Social Connections: Not on file  Intimate Partner Violence: Not on file   Family History  Problem Relation Age of Onset   Diabetes Father    Hyperlipidemia Father     Current Outpatient Medications  Medication Sig Dispense Refill   glipiZIDE (GLUCOTROL XL) 5 MG 24 hr tablet Take 1 tablet (5 mg total) by mouth daily with breakfast. 30 tablet 0   metFORMIN (GLUCOPHAGE) 500 MG tablet Take 1 tablet (500 mg total) by mouth daily with breakfast. 30 tablet 0   pregabalin (LYRICA) 25 MG capsule Take 1 capsule (25 mg total) by mouth 2 (two) times daily. 60 capsule 0   No current facility-administered medications for this visit.    No Known Allergies   REVIEW OF SYSTEMS:  '[X]'$  denotes positive finding, '[ ]'$  denotes negative finding Cardiac  Comments:  Chest pain or chest pressure:    Shortness of breath upon exertion:    Short of breath when lying flat:    Irregular heart rhythm:        Vascular  Pain in calf, thigh, or hip brought on by ambulation: X   Pain in feet at night that wakes you up from your sleep:     Blood clot in your veins:    Leg swelling:         Pulmonary    Oxygen at home:    Productive cough:     Wheezing:         Neurologic    Sudden weakness in arms or legs:     Sudden numbness in arms or legs:     Sudden onset of difficulty speaking or slurred speech:    Temporary loss of vision in one eye:     Problems with dizziness:         Gastrointestinal    Blood in stool:     Vomited blood:         Genitourinary    Burning when urinating:     Blood in urine:        Psychiatric    Major depression:         Hematologic    Bleeding problems:    Problems with blood clotting too easily:        Skin    Rashes or ulcers:        Constitutional     Fever or chills:      PHYSICAL EXAMINATION:  Vitals:   03/01/22 1458  BP: 129/87  Pulse: 69  Resp: 20  Temp: 98.3 F (36.8 C)  SpO2: 96%  Weight: 226 lb (102.5 kg)  Height: '6\' 1"'$  (1.854 m)    General:  WDWN in NAD; vital signs documented above Gait: Not observed HENT: WNL, normocephalic Pulmonary: normal non-labored breathing , without wheezing Cardiac: regular HR, Abdomen: soft, NT, no masses Skin: without rashes Vascular Exam/Pulses:  Right Left  Radial 2+ (normal) 2+ (normal)  Ulnar 2+ (normal) 2+ (normal)  Femoral 2 0  Popliteal    DP 2+ (normal) absent  PT 1+ (weak) absent   Extremities: without ischemic changes, without Gangrene , without cellulitis; without open wounds;  Musculoskeletal: no muscle wasting or atrophy  Neurologic: A&O X 3;  No focal weakness or paresthesias are detected Psychiatric:  The pt has Normal affect.   Non-Invasive Vascular Imaging:   ABI Findings:  +---------+------------------+-----+---------+--------+  Right    Rt Pressure (mmHg)IndexWaveform Comment   +---------+------------------+-----+---------+--------+  Brachial 130                                       +---------+------------------+-----+---------+--------+  PTA      130               1.00 triphasic          +---------+------------------+-----+---------+--------+  DP       128               0.98 triphasic          +---------+------------------+-----+---------+--------+  Great Toe83                0.64                    +---------+------------------+-----+---------+--------+   +---------+------------------+-----+----------+-------+  Left     Lt Pressure (mmHg)IndexWaveform  Comment  +---------+------------------+-----+----------+-------+  Brachial 125                                       +---------+------------------+-----+----------+-------+  PTA      82                0.63 monophasic          +---------+------------------+-----+----------+-------+  DP       69                0.53 monophasic         +---------+------------------+-----+----------+-------+  Great Toe30                0.23                    +---------+------------------+-----+----------+-------+   +-------+-----------+-----------+------------+------------+  ABI/TBIToday's ABIToday's TBIPrevious ABIPrevious TBI  +-------+-----------+-----------+------------+------------+  Right  1.00       0.64                                 +-------+-----------+-----------+------------+------------+  Left   0.63       0.23                                    ASSESSMENT/PLAN: SALEH ULBRICH is a 46 y.o. male presenting with a 6 to 58-monthhistory of left lower extremity progressive short distance claudication.  He denies rest pain, tissue loss.  ABIs were reviewed demonstrating significantly decreased perfusion in the left lower extremity with monophasic waveforms in the tibial vessels.  He was nonpalpable at the left common femoral artery, left pedal vessels.  A long conversation with BChaisregarding the above.  At 46years old, he has accelerated atherosclerotic disease.  This disease has progressed to the point he is having difficulty working full-time.  He would benefit from CT angio abdomen pelvis with runoff to assess inflow.   I am hoping the CT also sheds light on possible etiologies for 40lb weight loss. I will ensure his PCP is attached to this note for further workup.   I will see him in short interval follow up to discuss the results.    JBroadus John MD [facility] [phone]

## 2022-03-14 ENCOUNTER — Ambulatory Visit (INDEPENDENT_AMBULATORY_CARE_PROVIDER_SITE_OTHER)
Admission: RE | Admit: 2022-03-14 | Discharge: 2022-03-14 | Disposition: A | Discharge status: Home / Self Care | Payer: 59 | Source: Ambulatory Visit | Attending: Physician Assistant | Admitting: Physician Assistant

## 2022-03-14 ENCOUNTER — Ambulatory Visit (INDEPENDENT_AMBULATORY_CARE_PROVIDER_SITE_OTHER): Payer: 59 | Admitting: Physician Assistant

## 2022-03-14 ENCOUNTER — Encounter: Payer: Self-pay | Admitting: Physician Assistant

## 2022-03-14 ENCOUNTER — Other Ambulatory Visit (INDEPENDENT_AMBULATORY_CARE_PROVIDER_SITE_OTHER): Payer: 59

## 2022-03-14 ENCOUNTER — Other Ambulatory Visit: Payer: Self-pay | Admitting: Physician Assistant

## 2022-03-14 VITALS — BP 116/82 | HR 79 | Temp 98.2°F | Ht 73.0 in | Wt 220.2 lb

## 2022-03-14 DIAGNOSIS — I739 Peripheral vascular disease, unspecified: Secondary | ICD-10-CM

## 2022-03-14 DIAGNOSIS — Z1211 Encounter for screening for malignant neoplasm of colon: Secondary | ICD-10-CM

## 2022-03-14 DIAGNOSIS — F17210 Nicotine dependence, cigarettes, uncomplicated: Secondary | ICD-10-CM | POA: Diagnosis not present

## 2022-03-14 DIAGNOSIS — R634 Abnormal weight loss: Secondary | ICD-10-CM

## 2022-03-14 DIAGNOSIS — Z23 Encounter for immunization: Secondary | ICD-10-CM | POA: Diagnosis not present

## 2022-03-14 DIAGNOSIS — Z13220 Encounter for screening for lipoid disorders: Secondary | ICD-10-CM

## 2022-03-14 DIAGNOSIS — E1140 Type 2 diabetes mellitus with diabetic neuropathy, unspecified: Secondary | ICD-10-CM

## 2022-03-14 LAB — HIV ANTIBODY (ROUTINE TESTING W REFLEX): HIV 1&2 Ab, 4th Generation: NONREACTIVE

## 2022-03-14 LAB — COMPREHENSIVE METABOLIC PANEL
ALT: 43 U/L (ref 0–53)
AST: 25 U/L (ref 0–37)
Albumin: 4.8 g/dL (ref 3.5–5.2)
Alkaline Phosphatase: 49 U/L (ref 39–117)
BUN: 11 mg/dL (ref 6–23)
CO2: 24 mEq/L (ref 19–32)
Calcium: 9.5 mg/dL (ref 8.4–10.5)
Chloride: 103 mEq/L (ref 96–112)
Creatinine, Ser: 0.95 mg/dL (ref 0.40–1.50)
GFR: 95.52 mL/min (ref >60.00)
Glucose, Bld: 122 mg/dL — ABNORMAL HIGH (ref 70–99)
Potassium: 4.3 mEq/L (ref 3.5–5.1)
Sodium: 138 mEq/L (ref 135–145)
Total Bilirubin: 1.5 mg/dL — ABNORMAL HIGH (ref 0.2–1.2)
Total Protein: 7.4 g/dL (ref 6.0–8.3)

## 2022-03-14 LAB — CBC WITH DIFFERENTIAL/PLATELET
Basophils Absolute: 0.1 10*3/uL (ref 0.0–0.1)
Basophils Relative: 1.1 % (ref 0.0–3.0)
Eosinophils Absolute: 0.3 10*3/uL (ref 0.0–0.7)
Eosinophils Relative: 2.4 % (ref 0.0–5.0)
HCT: 50.5 % (ref 39.0–52.0)
Hemoglobin: 17.4 g/dL — ABNORMAL HIGH (ref 13.0–17.0)
Lymphocytes Relative: 31.1 % (ref 12.0–46.0)
Lymphs Abs: 3.5 10*3/uL (ref 0.7–4.0)
MCHC: 34.5 g/dL (ref 30.0–36.0)
MCV: 88.3 fl (ref 78.0–100.0)
Monocytes Absolute: 0.9 10*3/uL (ref 0.1–1.0)
Monocytes Relative: 7.8 % (ref 3.0–12.0)
Neutro Abs: 6.4 10*3/uL (ref 1.4–7.7)
Neutrophils Relative %: 57.6 % (ref 43.0–77.0)
Platelets: 201 10*3/uL (ref 150.0–400.0)
RBC: 5.72 Mil/uL (ref 4.22–5.81)
RDW: 13.6 % (ref 11.5–15.5)
WBC: 11.2 10*3/uL — ABNORMAL HIGH (ref 4.0–10.5)

## 2022-03-14 LAB — LIPID PANEL
Cholesterol: 70 mg/dL (ref 0–200)
HDL: 34 mg/dL — ABNORMAL LOW (ref >39.00)
LDL Cholesterol: 13 mg/dL (ref 0–99)
NonHDL: 35.67
Total CHOL/HDL Ratio: 2
Triglycerides: 113 mg/dL (ref 0.0–149.0)
VLDL: 22.6 mg/dL (ref 0.0–40.0)

## 2022-03-14 LAB — URINALYSIS
Bilirubin Urine: NEGATIVE
Hgb urine dipstick: NEGATIVE
Leukocytes,Ua: NEGATIVE
Nitrite: NEGATIVE
Specific Gravity, Urine: 1.01 (ref 1.000–1.030)
Total Protein, Urine: NEGATIVE
Urine Glucose: NEGATIVE
Urobilinogen, UA: 0.2 (ref 0.0–1.0)
pH: 6 (ref 5.0–8.0)

## 2022-03-14 LAB — C-REACTIVE PROTEIN: CRP: <1 mg/dL (ref 0.5–20.0)

## 2022-03-14 LAB — HEPATITIS C ANTIBODY: Hepatitis C Ab: NONREACTIVE

## 2022-03-14 LAB — TSH: TSH: 0.95 u[IU]/mL (ref 0.35–5.50)

## 2022-03-14 LAB — SEDIMENTATION RATE: Sed Rate: 7 mm/hr (ref 0–15)

## 2022-03-14 MED ORDER — FREESTYLE LITE W/DEVICE KIT: PACK | 2 refills | Status: AC

## 2022-03-14 NOTE — Progress Notes (Signed)
Subjective:    Patient ID: Bryan Hull, male    DOB: 1976/05/17, 46 y.o.   MRN: [id number]  Chief Complaint  Patient presents with   Follow-up    Pt coming in for f/u and med check; pt is fasting for labs today; pt concerned with constant weight loss and left leg numbness. RLE seems to be doing better and not as numb. Only able to walk 40 yards with needing to stop for rest, blood glucose readings have been bee a lot better as well. Pt ready to quit smoking and looking for help and medication.    HPI Patient is in today for f/up from new pt visit with me on 02/14/22.  Left leg gives out on him  Highest glucose has been 152. Staying away from breads and pastas. Checking sugar in morning and evenings after work - has been seeing 97-115 around this time.   Weight loss started about 5-6 months ago - weighed 267 lbs. Has unintentionally lost 47 lbs in that time. Appetite has stayed the same, says he has been eating. Starting to physically feel weaker. Staying tired. Feeling like muscles are wasting. Father's father had Bryan Hull disease.   Past Medical History:  Diagnosis Date   Diabetes mellitus without complication (HCC)     Past Surgical History:  Procedure Laterality Date   APPENDECTOMY      Family History  Problem Relation Age of Onset   Diabetes Father    Hyperlipidemia Father     Social History   Tobacco Use   Smoking status: Every Day    Packs/day: 1.00    Types: Cigarettes    Start date: 05/21/1993  Vaping Use   Vaping Use: Never used  Substance Use Topics   Alcohol use: No   Drug use: Yes    Types: Marijuana     No Known Allergies  Review of Systems NEGATIVE UNLESS OTHERWISE INDICATED IN HPI      Objective:     BP 116/82 (BP Location: Left Arm)   Pulse 79   Temp 98.2 F (36.8 C) (Temporal)   Ht '6\' 1"'$  (1.854 m)   Wt 220 lb 3.2 oz (99.9 kg)   SpO2 96%   BMI 29.05 kg/m   Wt Readings from Last 3 Encounters:  03/18/22 218 lb 12.8 oz (99.2  kg)  03/14/22 220 lb 3.2 oz (99.9 kg)  03/01/22 226 lb (102.5 kg)    BP Readings from Last 3 Encounters:  03/18/22 119/80  03/14/22 116/82  03/01/22 129/87     Physical Exam Vitals and nursing note reviewed.  Constitutional:      General: He is not in acute distress.    Appearance: Normal appearance. He is not toxic-appearing.  HENT:     Head: Normocephalic and atraumatic.     Right Ear: Tympanic membrane, ear canal and external ear normal.     Left Ear: Tympanic membrane, ear canal and external ear normal.     Nose: Nose normal.     Mouth/Throat:     Mouth: Mucous membranes are moist.     Pharynx: Oropharynx is clear.  Eyes:     Extraocular Movements: Extraocular movements intact.     Conjunctiva/sclera: Conjunctivae normal.     Pupils: Pupils are equal, round, and reactive to light.  Cardiovascular:     Rate and Rhythm: Normal rate and regular rhythm.     Pulses:          Dorsalis pedis pulses are  1+ on the right side and 1+ on the left side.       Posterior tibial pulses are 1+ on the right side and 1+ on the left side.     Heart sounds: Normal heart sounds.  Pulmonary:     Effort: Pulmonary effort is normal.     Breath sounds: Normal breath sounds.  Abdominal:     General: Abdomen is flat. Bowel sounds are normal.     Palpations: Abdomen is soft.     Tenderness: There is no abdominal tenderness.  Musculoskeletal:        General: Normal range of motion.     Cervical back: Normal range of motion and neck supple.     Right lower leg: No edema.     Left lower leg: No edema.  Skin:    General: Skin is warm and dry.  Neurological:     General: No focal deficit present.     Mental Status: He is alert and oriented to person, place, and time.     Comments: Monofilament decreased bilateral feet, ankles, lower legs  Psychiatric:        Mood and Affect: Mood normal.        Behavior: Behavior normal.        Assessment & Plan:   Problem List Items Addressed This  Visit   None Visit Diagnoses     Unintentional weight loss of more than 10 pounds in 90 days    -  Primary   Relevant Orders   CBC with Differential/Platelet (Completed)   Comprehensive metabolic panel (Completed)   TSH (Completed)   Fecal occult blood, imunochemical   HIV Antibody (routine testing w rflx) (Completed)   Hepatitis C antibody (Completed)   DG Chest 2 View (Completed)   Sedimentation rate (Completed)   C-reactive protein (Completed)   Urinalysis (Completed)   Type 2 diabetes mellitus with diabetic neuropathy, without long-term current use of insulin (HCC)       Relevant Medications   aspirin 325 MG tablet   Other Relevant Orders   CBC with Differential/Platelet (Completed)   Comprehensive metabolic panel (Completed)   Lipid panel (Completed)   Urinalysis (Completed)   Cigarette nicotine dependence without complication       Relevant Orders   CBC with Differential/Platelet (Completed)   Comprehensive metabolic panel (Completed)   DG Chest 2 View (Completed)   PAD (peripheral artery disease) (HCC)       Relevant Medications   aspirin 325 MG tablet   Colon cancer screening       Relevant Orders   Cologuard   Need for prophylactic vaccination with combined diphtheria-tetanus-pertussis (DTP) vaccine       Relevant Orders   Tdap vaccine greater than or equal to 7yo IM (Completed)   Screening for cholesterol level       Relevant Orders   Lipid panel (Completed)      Plan: Concerning weight loss of close to 50 pounds in the last 6 months.  Plan to start working through an unintentional weight loss diagnosis.  Labs to be checked as listed above today.  He is also working with vein and vascular about his peripheral arterial disease.  He is scheduled for a CT angio of abdomen and pelvis, which may also shed light on some of his unintentional weight loss.  Plan for Cologuard colon cancer screening at this time.  Also strongly encouraged him to think about smoking  cessation.  Tdap vaccine updated.  Patient  is going to have close follow-up with me in hopes of getting to the bottom of these concerns.  He knows to call back sooner if he has anything else come up.    Return in about 4 weeks (around 04/11/2022) for recheck .  This note was prepared with assistance of Systems analyst. Occasional wrong-word or sound-a-like substitutions may have occurred due to the inherent limitations of voice recognition software.    Jannett Schmall M Morty Ortwein, PA-C

## 2022-03-14 NOTE — Patient Instructions (Signed)
Great to see you again! Keep up good work with your medications and glucose control! Concern about this weight loss - please have labs and CXR done today at [facility].  I will see you soon! Call back with updates / concerns.

## 2022-03-15 ENCOUNTER — Other Ambulatory Visit: Payer: Self-pay

## 2022-03-15 DIAGNOSIS — I739 Peripheral vascular disease, unspecified: Secondary | ICD-10-CM

## 2022-03-15 MED ORDER — METFORMIN HCL 500 MG PO TABS: 500.0000 mg | ORAL_TABLET | Freq: Every day | ORAL | 0 refills | Status: DC

## 2022-03-15 MED ORDER — GLIPIZIDE ER 5 MG PO TB24: 5.0000 mg | ORAL_TABLET | Freq: Every day | ORAL | 0 refills | Status: DC

## 2022-03-15 MED ORDER — PREGABALIN 25 MG PO CAPS: 25.0000 mg | ORAL_CAPSULE | Freq: Two times a day (BID) | ORAL | 2 refills | Status: DC

## 2022-03-15 NOTE — Telephone Encounter (Signed)
Last OV: 03/14/22  Next OV: 04/12/22  Last Filled: 02/14/22  Quantity: 60 w/ 0 refills

## 2022-03-18 ENCOUNTER — Ambulatory Visit (INDEPENDENT_AMBULATORY_CARE_PROVIDER_SITE_OTHER): Payer: 59 | Admitting: Physician Assistant

## 2022-03-18 ENCOUNTER — Ambulatory Visit (INDEPENDENT_AMBULATORY_CARE_PROVIDER_SITE_OTHER)
Admission: RE | Admit: 2022-03-18 | Discharge: 2022-03-18 | Disposition: A | Discharge status: Home / Self Care | Payer: 59 | Source: Ambulatory Visit | Attending: Physician Assistant | Admitting: Physician Assistant

## 2022-03-18 ENCOUNTER — Encounter: Payer: Self-pay | Admitting: Physician Assistant

## 2022-03-18 VITALS — BP 119/80 | HR 78 | Temp 98.0°F | Ht 73.0 in | Wt 218.8 lb

## 2022-03-18 DIAGNOSIS — M25552 Pain in left hip: Secondary | ICD-10-CM

## 2022-03-18 NOTE — Progress Notes (Signed)
   Subjective:    Patient ID: DRAGON THRUSH, male    DOB: 06-Dec-1975, 46 y.o.   MRN: [id number]  Chief Complaint  Patient presents with   injured leg    Pt was helping wife with grocries out car, and fell on left side of hip.    HPI Patient is in today for left hip pain. DOI 03/17/22 - tripped and fell, landing on left side of hip on stone paver. Uncomfortable all night, didn't sleep well. Walking with a limp today. Has affected ROM of his hip. Requesting work note due to pain and difficulty walking. No numbness or tingling. No bruising. No other concerns.    Past Medical History:  Diagnosis Date   Diabetes mellitus without complication (HCC)     Past Surgical History:  Procedure Laterality Date   APPENDECTOMY      Family History  Problem Relation Age of Onset   Diabetes Father    Hyperlipidemia Father     Social History   Tobacco Use   Smoking status: Every Day    Packs/day: 1.00    Types: Cigarettes    Start date: 05/21/1993  Vaping Use   Vaping Use: Never used  Substance Use Topics   Alcohol use: No   Drug use: Yes    Types: Marijuana     No Known Allergies  Review of Systems NEGATIVE UNLESS OTHERWISE INDICATED IN HPI      Objective:     BP 119/80   Pulse 78   Temp 98 F (36.7 C)   Ht '6\' 1"'$  (1.854 m)   Wt 218 lb 12.8 oz (99.2 kg)   SpO2 96%   BMI 28.87 kg/m   Wt Readings from Last 3 Encounters:  03/18/22 218 lb 12.8 oz (99.2 kg)  03/14/22 220 lb 3.2 oz (99.9 kg)  03/01/22 226 lb (102.5 kg)    BP Readings from Last 3 Encounters:  03/18/22 119/80  03/14/22 116/82  03/01/22 129/87     Physical Exam Constitutional:      Appearance: Normal appearance.  Cardiovascular:     Rate and Rhythm: Normal rate and regular rhythm.     Pulses: Normal pulses.     Heart sounds: Normal heart sounds.  Pulmonary:     Effort: Pulmonary effort is normal.     Breath sounds: Normal breath sounds.  Musculoskeletal:     Left hip: Tenderness (lateral hip)  present. Decreased range of motion. Decreased strength.     Right lower leg: No edema.     Left lower leg: No edema.     Comments: Walking with a limp due to left hip pain  Skin:    General: Skin is warm.     Findings: No bruising, erythema or rash.  Neurological:     Mental Status: He is alert.        Assessment & Plan:   Problem List Items Addressed This Visit   None Visit Diagnoses     Acute hip pain, left    -  Primary   Relevant Orders   DG HIP UNILAT W OR W/O PELVIS 2-3 VIEWS LEFT      Plan: -Recommended XRAY at [facility] office today, need to r/o acute findings; treat pending results -Ambulate as tolerated -Rest, ice, Tylenol ES for pain -Work note provided x 2 days   Return if symptoms worsen or fail to improve.   Josuel Koeppen M Keshanna Riso, PA-C

## 2022-03-25 DIAGNOSIS — R634 Abnormal weight loss: Secondary | ICD-10-CM | POA: Insufficient documentation

## 2022-03-25 DIAGNOSIS — F17210 Nicotine dependence, cigarettes, uncomplicated: Secondary | ICD-10-CM | POA: Insufficient documentation

## 2022-03-25 DIAGNOSIS — I739 Peripheral vascular disease, unspecified: Secondary | ICD-10-CM | POA: Insufficient documentation

## 2022-03-25 DIAGNOSIS — E1140 Type 2 diabetes mellitus with diabetic neuropathy, unspecified: Secondary | ICD-10-CM | POA: Insufficient documentation

## 2022-03-26 ENCOUNTER — Ambulatory Visit (HOSPITAL_COMMUNITY)
Admission: RE | Admit: 2022-03-26 | Discharge: 2022-03-26 | Disposition: A | Discharge status: Home / Self Care | Payer: 59 | Source: Ambulatory Visit | Attending: Vascular Surgery | Admitting: Vascular Surgery

## 2022-03-26 DIAGNOSIS — I739 Peripheral vascular disease, unspecified: Secondary | ICD-10-CM | POA: Insufficient documentation

## 2022-03-26 MED ORDER — IOHEXOL 350 MG/ML SOLN
100.0000 mL | Freq: Once | INTRAVENOUS | Status: AC | PRN
  Administered 2022-03-26: 100 mL via INTRAVENOUS

## 2022-03-27 MED ORDER — PROPOFOL 1000 MG/100ML IV EMUL
INTRAVENOUS | Status: AC
  Filled 2022-03-27: qty 200

## 2022-03-27 MED ORDER — PHENYLEPHRINE HCL-NACL 20-0.9 MG/250ML-% IV SOLN
INTRAVENOUS | Status: AC
  Filled 2022-03-27: qty 500

## 2022-04-11 NOTE — Progress Notes (Unsigned)
Office Note     CC:  Abnormal ABI Requesting Provider:  [name], Randa Evens, PA-C  HPI: Bryan Hull is a 46 y.o. (01/28/1976) male presenting in follow up with a 76-monthhistory of left lower extremity progressive short distance claudication.  He denies rest pain, tissue loss.  ABIs were reviewed demonstrating significantly decreased perfusion in the left lower extremity with monophasic waveforms in the tibial vessels.  He was nonpalpable at the left common femoral artery, left pedal vessels.  On exam today, Bryan Hull worried.  He is only able to ambulate roughly 30 yards prior to debilitating claudication.  He is noting rest pain at night, which wakes him up from sleep every 30 minutes.  This is appreciated both in the hip as well as the foot.  Bryan Hull becoming more concerned as his symptoms have progressed.  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  He is working toward smoking cessation.  He denies wounds on the feet.  Bryan Buttswas recently diagnosed with diabetes Medication includes daily aspirin, no statin  Past Medical History:  Diagnosis Date   [place]     Past Surgical History:  Procedure Laterality Date   APPENDECTOMY      Social History   Socioeconomic History   Marital status: Married    Spouse name: Not on file   Number of children: 2   Years of education: Not on file   Highest education level: Not on file  Occupational History   Not on file  Tobacco Use   Smoking status: Every Day    Packs/day: 1.00    Types: Cigarettes    Start date: 05/21/1993   Smokeless tobacco: Not on file  Vaping Use   Vaping Use: Never used  Substance and Sexual Activity   Alcohol use: No   Drug use: Yes    Types: Marijuana   Sexual activity: Yes    Birth control/protection: None  Other Topics Concern   Not on file  Social History Narrative   Not on file   Social Determinants  of Health   Financial Resource Strain: Not on file  Food Insecurity: Not on file  Transportation Needs: Not on file  Physical Activity: Not on file  Stress: Not on file  Social Connections: Not on file  Intimate Partner Violence: Not on file   Family History  Problem Relation Age of Onset   Diabetes Father    Hyperlipidemia Father     Current Outpatient Medications  Medication Sig Dispense Refill   aspirin 325 MG tablet Take 325 mg by mouth daily.     atorvastatin (LIPITOR) 40 MG tablet Take 1 tablet (40 mg total) by mouth daily. 90 tablet 3   Blood Glucose Monitoring Suppl (FREESTYLE LITE) w/Device KIT Please use as directed to check sugar twice daily. 1 kit 2   CINNAMON PO Take by mouth.     FREESTYLE LITE test strip 2 (two) times daily.     glipiZIDE (GLUCOTROL XL) 5 MG 24 hr tablet Take 1 tablet (5 mg total) by mouth daily with breakfast. 30 tablet 0   Lancets (FREESTYLE) lancets SMARTSIG:Topical     metFORMIN (GLUCOPHAGE) 500 MG tablet Take 1 tablet (500 mg total) by mouth daily with breakfast. 30 tablet 0   pregabalin (LYRICA) 25 MG capsule Take 1 capsule (25 mg total) by mouth 2 (two) times daily. 60 capsule 2  No current facility-administered medications for this visit.    No Known Allergies   REVIEW OF SYSTEMS:  [X]  denotes positive finding, [ ]  denotes negative finding Cardiac  Comments:  Chest pain or chest pressure:    Shortness of breath upon exertion:    Short of breath when lying flat:    Irregular heart rhythm:        Vascular    Pain in calf, thigh, or hip brought on by ambulation: X   Pain in feet at night that wakes you up from your sleep:     Blood clot in your veins:    Leg swelling:         Pulmonary    Oxygen at home:    Productive cough:     Wheezing:         Neurologic    Sudden weakness in arms or legs:     Sudden numbness in arms or legs:     Sudden onset of difficulty speaking or slurred speech:    Temporary loss of vision in one  eye:     Problems with dizziness:         Gastrointestinal    Blood in stool:     Vomited blood:         Genitourinary    Burning when urinating:     Blood in urine:        Psychiatric    Major depression:         Hematologic    Bleeding problems:    Problems with blood clotting too easily:        Skin    Rashes or ulcers:        Constitutional    Fever or chills:      PHYSICAL EXAMINATION:  There were no vitals filed for this visit.   General:  WDWN in NAD; vital signs documented above Gait: Not observed HENT: WNL, normocephalic Pulmonary: normal non-labored breathing , without wheezing Cardiac: regular HR, Abdomen: soft, NT, no masses Skin: without rashes Vascular Exam/Pulses:  Right Left  Radial 2+ (normal) 2+ (normal)  Ulnar 2+ (normal) 2+ (normal)  Femoral 2 0  Popliteal    DP 2+ (normal) absent  PT 1+ (weak) absent   Extremities: without ischemic changes, without Gangrene , without cellulitis; without open wounds;  Musculoskeletal: no muscle wasting or atrophy  Neurologic: A&O X 3;  No focal weakness or paresthesias are detected Psychiatric:  The pt has Normal affect.   Non-Invasive Vascular Imaging:   ABI Findings:  +---------+------------------+-----+---------+--------+  Right    Rt Pressure (mmHg)IndexWaveform Comment   +---------+------------------+-----+---------+--------+  Brachial 130                                       +---------+------------------+-----+---------+--------+  PTA      130               1.00 triphasic          +---------+------------------+-----+---------+--------+  DP       128               0.98 triphasic          +---------+------------------+-----+---------+--------+  Great Toe83                0.64                    +---------+------------------+-----+---------+--------+   +---------+------------------+-----+----------+-------+  Left     Lt Pressure (mmHg)IndexWaveform  Comment   +---------+------------------+-----+----------+-------+  Brachial 125                                       +---------+------------------+-----+----------+-------+  PTA      82                0.63 monophasic         +---------+------------------+-----+----------+-------+  DP       69                0.53 monophasic         +---------+------------------+-----+----------+-------+  Great Toe30                0.23                    +---------+------------------+-----+----------+-------+   +-------+-----------+-----------+------------+------------+  ABI/TBIToday's ABIToday's TBIPrevious ABIPrevious TBI  +-------+-----------+-----------+------------+------------+  Right  1.00       0.64                                 +-------+-----------+-----------+------------+------------+  Left   0.63       0.23                                    ASSESSMENT/PLAN: NEVEN FINA is a 46 y.o. male presenting with a 6 to 59-monthhistory of left lower extremity short distance claudication, which has now progressed to rest pain.  BHoylecan only sleep 30 minutes at a time before waking up in severe pain in the hip leg.  He denies tissue loss in the feet, does appreciate some paresthesias.  Bryan Hull continues to work, driving a truck, but is becoming more more worried due to the paresthesias.  ABIs were reviewed demonstrating significantly decreased perfusion in the left lower extremity with monophasic waveforms in the tibial vessels.  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    I had a long conversation with him regarding the above.  The lesion would be best treated from an open approach as stenting would likely lead to kinking at the level of the inguinal ligament with suboptimal patency rate.  Repair would also exclude the small infrarenal abdominal aneurysm.  BEzeriahI discussed aorto bifemoral pass versus Aorto-right  iliac, left femoral bypass.  I quoted a 5% mortality, 10% morbidity.  Bryan Hull working on smoking cessation.  Medications include statin, aspirin.  Even with his young age, I would appreciate BQuocseeing a cardiologist for preoperative risk assessment due to his known peripheral arterial disease.  Discussing the risks and benefits, Bryan Hull to proceed.   Bryan John MD [facility] [phone]

## 2022-04-12 ENCOUNTER — Ambulatory Visit: Payer: 59 | Attending: Internal Medicine | Admitting: Internal Medicine

## 2022-04-12 ENCOUNTER — Encounter: Payer: Self-pay | Admitting: Internal Medicine

## 2022-04-12 ENCOUNTER — Encounter: Payer: Self-pay | Admitting: Physician Assistant

## 2022-04-12 ENCOUNTER — Ambulatory Visit (INDEPENDENT_AMBULATORY_CARE_PROVIDER_SITE_OTHER): Payer: 59 | Admitting: Physician Assistant

## 2022-04-12 ENCOUNTER — Encounter: Payer: Self-pay | Admitting: Vascular Surgery

## 2022-04-12 ENCOUNTER — Telehealth (HOSPITAL_COMMUNITY): Payer: Self-pay | Admitting: *Deleted

## 2022-04-12 ENCOUNTER — Ambulatory Visit (INDEPENDENT_AMBULATORY_CARE_PROVIDER_SITE_OTHER): Payer: 59 | Admitting: Vascular Surgery

## 2022-04-12 VITALS — BP 112/76 | HR 69 | Temp 98.3°F | Ht 73.0 in | Wt 217.0 lb

## 2022-04-12 VITALS — BP 90/70 | HR 65 | Ht 74.0 in | Wt 217.4 lb

## 2022-04-12 VITALS — BP 113/77 | HR 81 | Temp 98.0°F | Resp 20 | Ht 73.0 in | Wt 217.0 lb

## 2022-04-12 DIAGNOSIS — I739 Peripheral vascular disease, unspecified: Secondary | ICD-10-CM

## 2022-04-12 DIAGNOSIS — R634 Abnormal weight loss: Secondary | ICD-10-CM | POA: Diagnosis not present

## 2022-04-12 DIAGNOSIS — R17 Unspecified jaundice: Secondary | ICD-10-CM

## 2022-04-12 DIAGNOSIS — E119 Type 2 diabetes mellitus without complications: Secondary | ICD-10-CM | POA: Diagnosis not present

## 2022-04-12 DIAGNOSIS — F17210 Nicotine dependence, cigarettes, uncomplicated: Secondary | ICD-10-CM

## 2022-04-12 DIAGNOSIS — I70222 Atherosclerosis of native arteries of extremities with rest pain, left leg: Secondary | ICD-10-CM

## 2022-04-12 DIAGNOSIS — Z01810 Encounter for preprocedural cardiovascular examination: Secondary | ICD-10-CM | POA: Diagnosis not present

## 2022-04-12 DIAGNOSIS — M25552 Pain in left hip: Secondary | ICD-10-CM | POA: Diagnosis not present

## 2022-04-12 DIAGNOSIS — Z832 Family history of diseases of the blood and blood-forming organs and certain disorders involving the immune mechanism: Secondary | ICD-10-CM

## 2022-04-12 DIAGNOSIS — R7989 Other specified abnormal findings of blood chemistry: Secondary | ICD-10-CM

## 2022-04-12 DIAGNOSIS — E1140 Type 2 diabetes mellitus with diabetic neuropathy, unspecified: Secondary | ICD-10-CM

## 2022-04-12 MED ORDER — METFORMIN HCL 500 MG PO TABS: 500.0000 mg | ORAL_TABLET | Freq: Every day | ORAL | 0 refills | Status: DC

## 2022-04-12 MED ORDER — ATORVASTATIN CALCIUM 40 MG PO TABS: 40.0000 mg | ORAL_TABLET | Freq: Every day | ORAL | 3 refills | Status: DC

## 2022-04-12 MED ORDER — VARENICLINE TARTRATE 1 MG PO TABS: 1.0000 mg | ORAL_TABLET | Freq: Two times a day (BID) | ORAL | 2 refills | Status: AC

## 2022-04-12 MED ORDER — VARENICLINE TARTRATE 0.5 MG PO TABS: ORAL_TABLET | ORAL | 0 refills | Status: DC

## 2022-04-12 MED ORDER — GLIPIZIDE ER 2.5 MG PO TB24: 2.5000 mg | ORAL_TABLET | Freq: Every day | ORAL | 0 refills | Status: DC

## 2022-04-12 MED ORDER — PREGABALIN 50 MG PO CAPS: 50.0000 mg | ORAL_CAPSULE | Freq: Two times a day (BID) | ORAL | 0 refills | Status: DC

## 2022-04-12 MED ORDER — GLIPIZIDE ER 5 MG PO TB24: 5.0000 mg | ORAL_TABLET | Freq: Every day | ORAL | 0 refills | Status: DC

## 2022-04-12 MED ORDER — PREGABALIN 25 MG PO CAPS: 25.0000 mg | ORAL_CAPSULE | Freq: Two times a day (BID) | ORAL | 2 refills | Status: DC

## 2022-04-12 NOTE — Telephone Encounter (Signed)
Close encounter 

## 2022-04-12 NOTE — Patient Instructions (Signed)
Medication Instructions:  NO CHANGES  *If you need a refill on your cardiac medications before your next appointment, please call your pharmacy*   Lab Work: LPa and Factor V Leiden test   If you have labs (blood work) drawn today and your tests are completely normal, you will receive your results only by: [place] (if you have MyChart) OR A paper copy in the mail If you have any lab test that is abnormal or we need to change your treatment, we will call you to review the results.   Testing/Procedures: [place]ane Call Stress Test    Follow-Up: At [facility], you and your health needs are our priority.  As part of our continuing mission to provide you with exceptional heart care, we have created designated Provider Care Teams.  These Care Teams include your primary Cardiologist (physician) and Advanced Practice Providers (APPs -  Physician Assistants and Nurse Practitioners) who all work together to provide you with the care you need, when you need it.  We recommend signing up for the patient portal called "MyChart".  Sign up information is provided on this After Visit Summary.  MyChart is used to connect with patients for Virtual Visits (Telemedicine).  Patients are able to view lab/test results, encounter notes, upcoming appointments, etc.  Non-urgent messages can be sent to your provider as well.   To learn more about what you can do with MyChart, go to [email or web address].    Your next appointment:    3 months with Dr. Debara Pickett

## 2022-04-12 NOTE — Progress Notes (Signed)
OFFICE CONSULT NOTE  Chief Complaint:  Preoperative evaluation  Primary Care Physician: [name], Randa Evens, PA-C  HPI:  Bryan Hull is a 46 y.o. male who is being seen today for the evaluation of preoperative risk at the request of [name], [name], PA-C.  This is a pleasant 46 year old male kindly referred for evaluation management of preoperative risk.  He was found to have peripheral arterial disease after complaints of leg pain and heaviness when walking.  The symptoms were obviously concerning for claudication and his PAD is significant, to the point where it is felt that he will likely need aortofemoral bypass.  He has been seeing Dr. Virl Cagey with vascular and vein specialists.  He apparently has a history of heart disease in his family including his father who had diabetes and an MI in grandfather who also had significant PAD.  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  He did have a recent diabetes diagnosis.  He was started on full dose aspirin and atorvastatin although his untreated lipids in August were quite low with total cholesterol 70, HDL 34, triglycerides 113 and LDL 13.  It is quite unusual with this lipid profile that he has such extensive PAD, which actually makes me concerned that he may have a genetic dyslipidemia such as a high LP(a).  He also mentioned today that multiple family members have the factor V Leiden mutation and there have been venous thromboses in the family.  Since he is contemplating surgery, we should look for this as it may play a role in perioperative anticoagulation.  With regards to cardiovascular risk, he says he is fairly active and denies any chest pain or shortness of breath although he is scheduled for what would be a high risk surgery.  PMHx:  Past Medical History:  Diagnosis Date   Diabetes mellitus without complication ([place])     Past Surgical History:  Procedure Laterality Date   APPENDECTOMY      FAMHx:  Family  History  Problem Relation Age of Onset   Diabetes Father    Hyperlipidemia Father     SOCHx:   reports that he has been smoking cigarettes. He started smoking about 28 years ago. He has been smoking an average of 1 pack per day. He does not have any smokeless tobacco history on file. He reports current drug use. Drug: Marijuana. He reports that he does not drink alcohol.  ALLERGIES:  No Known Allergies  ROS: Pertinent items noted in HPI and remainder of comprehensive ROS otherwise negative.  HOME MEDS: Current Outpatient Medications on File Prior to Visit  Medication Sig Dispense Refill   aspirin 325 MG tablet Take 325 mg by mouth daily.     atorvastatin (LIPITOR) 40 MG tablet Take 1 tablet (40 mg total) by mouth daily. 90 tablet 3   Blood Glucose Monitoring Suppl (FREESTYLE LITE) w/Device KIT Please use as directed to check sugar twice daily. 1 kit 2   CINNAMON PO Take by mouth.     FREESTYLE LITE test strip 2 (two) times daily.     glipiZIDE (GLUCOTROL XL) 2.5 MG 24 hr tablet Take 1 tablet (2.5 mg total) by mouth daily with breakfast. 30 tablet 0   Lancets (FREESTYLE) lancets SMARTSIG:Topical     metFORMIN (GLUCOPHAGE) 500 MG tablet Take 1 tablet (500 mg total) by mouth daily with breakfast. 90 tablet 0   pregabalin (LYRICA) 25 MG capsule Take 1 capsule (25 mg total) by  mouth 2 (two) times daily. 60 capsule 2   pregabalin (LYRICA) 50 MG capsule Take 1 capsule (50 mg total) by mouth 2 (two) times daily. 60 capsule 0   varenicline (CHANTIX) 0.5 MG tablet Days 1 to 3: Oral: 0.5 mg once daily. Days 4 to 7: Oral: 0.5 mg twice daily 11 tablet 0   varenicline (CHANTIX) 1 MG tablet Take 1 tablet (1 mg total) by mouth 2 (two) times daily. 60 tablet 2   No current facility-administered medications on file prior to visit.    LABS/IMAGING: No results found for this or any previous visit (from the past 48 hour(s)). No results found.  LIPID PANEL:    Component Value Date/Time   CHOL 70  03/14/2022 0851   TRIG 113.0 03/14/2022 0851   HDL 34.00 (L) 03/14/2022 0851   CHOLHDL 2 03/14/2022 0851   VLDL 22.6 03/14/2022 0851   LDLCALC 13 03/14/2022 0851    WEIGHTS: Wt Readings from Last 3 Encounters:  04/12/22 217 lb 6.4 oz (98.6 kg)  04/12/22 217 lb (98.4 kg)  04/12/22 217 lb (98.4 kg)    VITALS: BP 90/70   Pulse 65   Ht _0  (1.88 m)   Wt 217 lb 6.4 oz (98.6 kg)   SpO2 93%   BMI 27.91 kg/m   EXAM: General appearance: alert, no distress, and thin Neck: no carotid bruit, no JVD, and thyroid not enlarged, symmetric, no tenderness/mass/nodules Lungs: diminished breath sounds bilaterally Heart: regular rate and rhythm, S1, S2 normal, no murmur, click, rub or gallop Abdomen: soft, non-tender; bowel sounds normal; no masses,  no organomegaly Extremities: extremities normal, atraumatic, no cyanosis or edema Pulses: Weak distal Skin: Cool, dry Neurologic: Grossly normal Psych: Pleasant  EKG: Sinus rhythm at 65- personally reviewed  ASSESSMENT: Intermediate to high risk for a high risk procedure PAD with lifestyle limiting claudication Multiple family members with factor V Leiden mutation Genetic dyslipidemia Type 2 diabetes Former smoker  PLAN: 1.   Bryan Hull is at intermediate to high risk for what be considered a high risk procedure.  He has a family history of cardiovascular disease in multiple family members but has a surprisingly low lipid profile.  I am concerned that he may have a high LP(a) and will test for that.  He also mentioned family members that have factor V Leiden and I will send off for that genetic testing as well.  With regards to preoperative testing although he is active, he does not able to do a lot of exercise and I would like for him to have a Lexiscan Myoview to at least look for possible obstructive coronary disease.  If this is low risk then he could proceed for with surgery.  Obviously, if he does have a factor V Leiden mutation, will  consider perioperative anticoagulation rather than just aspirin to avoid the risk of venous thromboembolism.  Plan follow-up with me afterwards.  Thanks again for the kind referral.  Pixie Casino, MD, FACC, [place] Director of the [facility]  [facility] Diplomate of the American Board of Clinical Lipidology Attending Cardiologist  Direct Dial: [phone]  Fax: [phone]  Website:  [email or web address] 04/12/2022, 1:15 PM

## 2022-04-12 NOTE — Progress Notes (Unsigned)
Subjective:    Patient ID: Bryan Hull, male    DOB: [birth year], 46 y.o.   MRN: [id number]  Chief Complaint  Patient presents with   Follow-up    Pt in for f/u of left hip pain; pt states still having trouble sleeping and pain waking him up in the night and slow getting to sleep, changing positions due to soreness, just a slight improvement, pt aware needs diabetic eye screening and will call to schedule; Pt states vascular surgeon Dr Virl Cagey planning for surgery.Medications needed refills and sent to pharmacy Lyrica needs to be refilled as well;     HPI Patient is in today for follow-up from 03/18/22. See CC note from nurse.   Used to drink up to 10 sodas / day, doesn't drink any now. Now having some readings of glucose in 50s or 60s in the afternoon. He's worried he has "too good" control of his sugars now.   Past Medical History:  Diagnosis Date   Diabetes mellitus without complication (HCC)     Past Surgical History:  Procedure Laterality Date   APPENDECTOMY      Family History  Problem Relation Age of Onset   Diabetes Father    Hyperlipidemia Father     Social History   Tobacco Use   Smoking status: Every Day    Packs/day: 1.00    Types: Cigarettes    Start date: 05/21/1993  Vaping Use   Vaping Use: Never used  Substance Use Topics   Alcohol use: No   Drug use: Yes    Types: Marijuana     No Known Allergies  Review of Systems NEGATIVE UNLESS OTHERWISE INDICATED IN HPI      Objective:     BP 112/76 (BP Location: Left Arm)   Pulse 69   Temp 98.3 F (36.8 C) (Temporal)   Ht '6\' 1"'$  (1.854 m)   Wt 217 lb (98.4 kg)   SpO2 97%   BMI 28.63 kg/m   Wt Readings from Last 3 Encounters:  04/12/22 217 lb 6.4 oz (98.6 kg)  04/12/22 217 lb (98.4 kg)  04/12/22 217 lb (98.4 kg)    BP Readings from Last 3 Encounters:  04/12/22 90/70  04/12/22 112/76  04/12/22 113/77     Physical Exam Constitutional:      Appearance: Normal appearance.  Eyes:      Extraocular Movements: Extraocular movements intact.     Conjunctiva/sclera: Conjunctivae normal.     Pupils: Pupils are equal, round, and reactive to light.  Cardiovascular:     Rate and Rhythm: Normal rate and regular rhythm.     Pulses: Normal pulses.     Heart sounds: Normal heart sounds.  Pulmonary:     Effort: Pulmonary effort is normal.     Breath sounds: Normal breath sounds.  Musculoskeletal:     Left hip: Tenderness (lateral hip) present. Decreased range of motion. Decreased strength.     Right lower leg: No edema.     Left lower leg: No edema.     Comments: Walking with a limp due to left hip pain  Skin:    General: Skin is warm.     Findings: No bruising, erythema or rash.  Neurological:     Mental Status: He is alert.  Psychiatric:        Mood and Affect: Mood is depressed.        Assessment & Plan:  Abnormal CBC -     CBC with Differential/Platelet; Future  Elevated bilirubin -     Comprehensive metabolic panel; Future  Unintentional weight loss of more than 10 pounds in 90 days Assessment & Plan: Etiology still unclear.  Plan to update CBC and CMP today.  He needs to complete Cologuard, but would not be opposed to a colonoscopy.  We can continue to monitor this closely.   Acute hip pain, left  Type 2 diabetes mellitus with diabetic neuropathy, without long-term current use of insulin ([place]) Assessment & Plan: Starting to have some hypoglycemia.  Plan to reduce glipizide ER to the 2.5 mg dose once daily.  He can continue metformin 500 mg once daily as well.  He will keep me updated on his readings.  Also plan to increase Lyrica to 50 mg 1 tablet twice daily.   PAD (peripheral artery disease) ([place]) Assessment & Plan: Anticipating surgery with vascular surgeon   Cigarette nicotine dependence without complication Assessment & Plan: Patient is very motivated to quit.  He is ready to get into the action stop.  Several options discussed and we plan to start  on Chantix.  Possible side effects discussed.  Dosing schedule discussed with patient.  Anticipate about 12 weeks to fully complete this process of quitting.   Other orders -     Pregabalin; Take 1 capsule (25 mg total) by mouth 2 (two) times daily.  Dispense: 60 capsule; Refill: 2 -     glipiZIDE ER; Take 1 tablet (2.5 mg total) by mouth daily with breakfast.  Dispense: 30 tablet; Refill: 0 -     metFORMIN HCl; Take 1 tablet (500 mg total) by mouth daily with breakfast.  Dispense: 90 tablet; Refill: 0 -     Varenicline Tartrate; Days 1 to 3: Oral: 0.5 mg once daily. Days 4 to 7: Oral: 0.5 mg twice daily  Dispense: 11 tablet; Refill: 0 -     Varenicline Tartrate; Take 1 tablet (1 mg total) by mouth 2 (two) times daily.  Dispense: 60 tablet; Refill: 2 -     Pregabalin; Take 1 capsule (50 mg total) by mouth 2 (two) times daily.  Dispense: 60 capsule; Refill: 0    Also talked with patient about possibly seeing sports medicine or ortho for his hip pain.  He wants to continue work-up with vascular surgery and get 1 thing taken care of at a time.    Return in about 6 weeks (around 05/24/2022) for recheck HA1c / meds .  This note was prepared with assistance of Systems analyst. Occasional wrong-word or sound-a-like substitutions may have occurred due to the inherent limitations of voice recognition software.     Doree Kuehne M Willene Holian, PA-C

## 2022-04-14 NOTE — Assessment & Plan Note (Signed)
Patient is very motivated to quit.  He is ready to get into the action stop.  Several options discussed and we plan to start on Chantix.  Possible side effects discussed.  Dosing schedule discussed with patient.  Anticipate about 12 weeks to fully complete this process of quitting.

## 2022-04-14 NOTE — Assessment & Plan Note (Signed)
Starting to have some hypoglycemia.  Plan to reduce glipizide ER to the 2.5 mg dose once daily.  He can continue metformin 500 mg once daily as well.  He will keep me updated on his readings.  Also plan to increase Lyrica to 50 mg 1 tablet twice daily.

## 2022-04-14 NOTE — Assessment & Plan Note (Signed)
Anticipating surgery with vascular surgeon

## 2022-04-14 NOTE — Assessment & Plan Note (Signed)
Etiology still unclear.  Plan to update CBC and CMP today.  He needs to complete Cologuard, but would not be opposed to a colonoscopy.  We can continue to monitor this closely.

## 2022-04-16 ENCOUNTER — Telehealth: Payer: Self-pay

## 2022-04-16 ENCOUNTER — Ambulatory Visit (HOSPITAL_COMMUNITY)
Admission: RE | Admit: 2022-04-16 | Discharge: 2022-04-16 | Disposition: A | Discharge status: Home / Self Care | Payer: 59 | Source: Ambulatory Visit | Attending: Cardiology | Admitting: Cardiology

## 2022-04-16 DIAGNOSIS — I739 Peripheral vascular disease, unspecified: Secondary | ICD-10-CM | POA: Insufficient documentation

## 2022-04-16 DIAGNOSIS — Z01810 Encounter for preprocedural cardiovascular examination: Secondary | ICD-10-CM | POA: Diagnosis present

## 2022-04-16 DIAGNOSIS — E119 Type 2 diabetes mellitus without complications: Secondary | ICD-10-CM | POA: Insufficient documentation

## 2022-04-16 LAB — MYOCARDIAL PERFUSION IMAGING
LV dias vol: 156 mL (ref 62–150)
LV sys vol: 88 mL
Nuc Stress EF: 44 %
Peak HR: 102 {beats}/min
Rest HR: 57 {beats}/min
Rest Nuclear Isotope Dose: 10.5 mCi
SDS: 0
SRS: 3
SSS: 3
ST Depression (mm): 0 mm
Stress Nuclear Isotope Dose: 31 mCi
TID: 1.19

## 2022-04-16 MED ORDER — TECHNETIUM TC 99M TETROFOSMIN IV KIT
10.5000 | PACK | Freq: Once | INTRAVENOUS | Status: AC | PRN
  Administered 2022-04-16: 10.5 via INTRAVENOUS

## 2022-04-16 MED ORDER — REGADENOSON 0.4 MG/5ML IV SOLN
0.4000 mg | Freq: Once | INTRAVENOUS | Status: AC
  Administered 2022-04-16: 0.4 mg via INTRAVENOUS

## 2022-04-16 MED ORDER — TECHNETIUM TC 99M TETROFOSMIN IV KIT
31.0000 | PACK | Freq: Once | INTRAVENOUS | Status: AC | PRN
  Administered 2022-04-16: 31 via INTRAVENOUS

## 2022-04-16 NOTE — Telephone Encounter (Signed)
Pt called requesting an out of work note due to work becoming more challenging for him. He is scheduled for his stress test today. Surgery will be scheduled after pt receives cardiac clearance. I have sent this request to MD. Will await his response on how to proceed. Pt is aware of this.

## 2022-04-17 ENCOUNTER — Encounter: Payer: 59 | Admitting: Vascular Surgery

## 2022-04-17 ENCOUNTER — Other Ambulatory Visit: Payer: Self-pay

## 2022-04-17 ENCOUNTER — Telehealth: Payer: Self-pay | Admitting: *Deleted

## 2022-04-17 DIAGNOSIS — I70222 Atherosclerosis of native arteries of extremities with rest pain, left leg: Secondary | ICD-10-CM

## 2022-04-17 NOTE — Telephone Encounter (Signed)
   Pre-operative Risk Assessment    Patient Name: Bryan Hull  DOB: 05-Feb-1976 MRN: [id number]      Request for Surgical Clearance    Procedure:   AORTOBIFEMORAL BYPASS  Date of Surgery:  Clearance 04/22/22                                 Surgeon:  DR. Orlie Pollen Surgeon's Group or Practice Name: [facility] Phone number:  [phone] Fax number:  [phone]   Type of Clearance Requested:   - Medical ; PER DR. Virl Cagey ASA DOES NOTE NEED TO BE HELD   Type of Anesthesia:  General    Additional requests/questions:    Jiles Prows   04/17/2022, 12:11 PM

## 2022-04-17 NOTE — Telephone Encounter (Signed)
   Patient Name: Bryan Hull  DOB: 1976/06/22 MRN: [id number]  Primary Cardiologist: Dr. Debara Pickett  Chart reviewed as part of pre-operative protocol coverage. Patient was already seen 04/12/22 by Dr. Debara Pickett for pre-op evaluation at which time stress test was ordered and is pending review by provider. Per office protocol, the provider who saw the patient should forward their finalized clearance decision to requesting party below upon completion of testing. I will route this message to Dr. Debara Pickett so he is aware of the requesting party to fax final recommendations to.  I will remove this message from the pre-op box as separate preop APP input is not needed.  Charlie Pitter, PA-C 04/17/2022, 12:40 PM

## 2022-04-18 NOTE — Telephone Encounter (Signed)
Low risk stress test, ok to proceed with vascular surgery.  Dr Debara Pickett

## 2022-04-18 NOTE — Telephone Encounter (Signed)
   Patient Name: Bryan Hull  DOB: 1975-11-08 MRN: [id number]  Primary Cardiologist: None  Chart reviewed as part of pre-operative protocol coverage. Pre-op clearance already addressed by colleagues in earlier phone notes. To summarize recommendations:  -Low risk stress test, ok to proceed with vascular surgery.   Dr Debara Pickett  Will route this bundled recommendation to requesting provider via Epic fax function and remove from pre-op pool. Please call with questions.  Elgie Collard, PA-C 04/18/2022, 4:20 PM

## 2022-04-19 ENCOUNTER — Other Ambulatory Visit: Payer: Self-pay

## 2022-04-19 ENCOUNTER — Encounter (HOSPITAL_COMMUNITY): Payer: Self-pay | Admitting: Vascular Surgery

## 2022-04-19 LAB — LIPOPROTEIN A (LPA): Lipoprotein (a): 8.7 nmol/L (ref <75.0)

## 2022-04-19 LAB — FACTOR 5 LEIDEN

## 2022-04-19 NOTE — Progress Notes (Signed)
Spoke with pt for pre-op call. Pt denies cardiac history or HTN. Pt is a type 2 diabetic. Last A1C was 9.6 on 02/14/22. States his fasting blood sugar is usually between 70-120. Instructed pt not to take his Glipizide or Metformin the morning of surgery. Instructed pt to check his blood sugar when he wakes up Monday AM. If blood sugar is 70 or below, treat with 1/2 cup of clear juice (apple or cranberry) and recheck blood sugar 15 minutes after drinking juice. If blood sugar continues to be 70 or below, let the nurse know on your arrival to Short Stay.   Shower instructions given to pt and he voiced understanding.

## 2022-04-21 NOTE — Anesthesia Preprocedure Evaluation (Signed)
Anesthesia Evaluation  Patient identified by MRN, date of birth, ID band Patient awake    Reviewed: Allergy & Precautions, NPO status , Patient's Chart, lab work & pertinent test results  Airway Mallampati: III  TM Distance: >3 FB Neck ROM: Full    Dental  (+) Dental Advisory Given, Poor Dentition   Pulmonary asthma , Current Smoker,    Pulmonary exam normal breath sounds clear to auscultation       Cardiovascular + Peripheral Vascular Disease  DVT: Critical limb ischemia of left lower extremity with rest pain.  Normal cardiovascular exam Rhythm:Regular Rate:Normal     Neuro/Psych negative neurological ROS     GI/Hepatic negative GI ROS, Neg liver ROS,   Endo/Other  diabetes, Type 2, Oral Hypoglycemic Agents  Renal/GU negative Renal ROS     Musculoskeletal negative musculoskeletal ROS (+)   Abdominal   Peds  Hematology negative hematology ROS (+)   Anesthesia Other Findings   Reproductive/Obstetrics                            Anesthesia Physical Anesthesia Plan  ASA: 3  Anesthesia Plan: General   Post-op Pain Management: Tylenol PO (pre-op)*   Induction: Intravenous  PONV Risk Score and Plan: 2 and Midazolam, Dexamethasone and Ondansetron  Airway Management Planned: Oral ETT  Additional Equipment: Arterial line, CVP and Ultrasound Guidance Line Placement  Intra-op Plan:   Post-operative Plan: Possible Post-op intubation/ventilation  Informed Consent: I have reviewed the patients History and Physical, chart, labs and discussed the procedure including the risks, benefits and alternatives for the proposed anesthesia with the patient or authorized representative who has indicated his/her understanding and acceptance.     Dental advisory given  Plan Discussed with: CRNA  Anesthesia Plan Comments:        Anesthesia Quick Evaluation

## 2022-04-22 ENCOUNTER — Encounter (HOSPITAL_COMMUNITY)
Admission: RE | Disposition: A | Discharge status: Home / Self Care | Payer: Self-pay | Source: Home / Self Care | Attending: Vascular Surgery

## 2022-04-22 ENCOUNTER — Inpatient Hospital Stay (HOSPITAL_COMMUNITY)
Admission: RE | Admit: 2022-04-22 | Discharge: 2022-04-27 | DRG: 271 | Disposition: A | Discharge status: Home / Self Care | Payer: 59 | Attending: Vascular Surgery | Admitting: Vascular Surgery

## 2022-04-22 ENCOUNTER — Other Ambulatory Visit: Payer: Self-pay

## 2022-04-22 ENCOUNTER — Encounter (HOSPITAL_COMMUNITY): Payer: Self-pay | Admitting: Vascular Surgery

## 2022-04-22 ENCOUNTER — Inpatient Hospital Stay (HOSPITAL_COMMUNITY): Payer: 59

## 2022-04-22 ENCOUNTER — Inpatient Hospital Stay (HOSPITAL_COMMUNITY): Payer: 59 | Admitting: Anesthesiology

## 2022-04-22 DIAGNOSIS — E1151 Type 2 diabetes mellitus with diabetic peripheral angiopathy without gangrene: Principal | ICD-10-CM | POA: Diagnosis present

## 2022-04-22 DIAGNOSIS — Z832 Family history of diseases of the blood and blood-forming organs and certain disorders involving the immune mechanism: Secondary | ICD-10-CM

## 2022-04-22 DIAGNOSIS — Z833 Family history of diabetes mellitus: Secondary | ICD-10-CM | POA: Diagnosis not present

## 2022-04-22 DIAGNOSIS — J45909 Unspecified asthma, uncomplicated: Secondary | ICD-10-CM

## 2022-04-22 DIAGNOSIS — Z7982 Long term (current) use of aspirin: Secondary | ICD-10-CM | POA: Diagnosis not present

## 2022-04-22 DIAGNOSIS — Z83438 Family history of other disorder of lipoprotein metabolism and other lipidemia: Secondary | ICD-10-CM

## 2022-04-22 DIAGNOSIS — Z7984 Long term (current) use of oral hypoglycemic drugs: Secondary | ICD-10-CM | POA: Diagnosis not present

## 2022-04-22 DIAGNOSIS — I513 Intracardiac thrombosis, not elsewhere classified: Secondary | ICD-10-CM | POA: Diagnosis present

## 2022-04-22 DIAGNOSIS — I70222 Atherosclerosis of native arteries of extremities with rest pain, left leg: Secondary | ICD-10-CM | POA: Diagnosis present

## 2022-04-22 DIAGNOSIS — I745 Embolism and thrombosis of iliac artery: Secondary | ICD-10-CM | POA: Diagnosis present

## 2022-04-22 DIAGNOSIS — I7143 Infrarenal abdominal aortic aneurysm, without rupture: Secondary | ICD-10-CM | POA: Diagnosis present

## 2022-04-22 DIAGNOSIS — D6851 Activated protein C resistance: Secondary | ICD-10-CM | POA: Diagnosis present

## 2022-04-22 DIAGNOSIS — I7409 Other arterial embolism and thrombosis of abdominal aorta: Secondary | ICD-10-CM | POA: Diagnosis present

## 2022-04-22 DIAGNOSIS — E1140 Type 2 diabetes mellitus with diabetic neuropathy, unspecified: Secondary | ICD-10-CM | POA: Diagnosis present

## 2022-04-22 DIAGNOSIS — F17210 Nicotine dependence, cigarettes, uncomplicated: Secondary | ICD-10-CM

## 2022-04-22 DIAGNOSIS — I739 Peripheral vascular disease, unspecified: Principal | ICD-10-CM | POA: Diagnosis present

## 2022-04-22 HISTORY — DX: Peripheral vascular disease, unspecified: I73.9

## 2022-04-22 HISTORY — DX: Unspecified asthma, uncomplicated: J45.909

## 2022-04-22 HISTORY — DX: Family history of diseases of the blood and blood-forming organs and certain disorders involving the immune mechanism: Z83.2

## 2022-04-22 HISTORY — PX: AORTA - BILATERAL FEMORAL ARTERY BYPASS GRAFT: SHX1175

## 2022-04-22 LAB — SURGICAL PCR SCREEN
MRSA, PCR: NEGATIVE
Staphylococcus aureus: NEGATIVE

## 2022-04-22 LAB — CBC
HCT: 49.1 % (ref 39.0–52.0)
HCT: 40.8 % (ref 39.0–52.0)
HCT: 41.6 % (ref 39.0–52.0)
Hemoglobin: 17.3 g/dL — ABNORMAL HIGH (ref 13.0–17.0)
Hemoglobin: 13.8 g/dL (ref 13.0–17.0)
Hemoglobin: 14.4 g/dL (ref 13.0–17.0)
MCH: 31.3 pg (ref 26.0–34.0)
MCH: 30.9 pg (ref 26.0–34.0)
MCH: 30.7 pg (ref 26.0–34.0)
MCHC: 35.2 g/dL (ref 30.0–36.0)
MCHC: 33.8 g/dL (ref 30.0–36.0)
MCHC: 34.6 g/dL (ref 30.0–36.0)
MCV: 88.9 fL (ref 80.0–100.0)
MCV: 91.3 fL (ref 80.0–100.0)
MCV: 88.7 fL (ref 80.0–100.0)
Platelets: 175 10*3/uL (ref 150–400)
Platelets: 143 10*3/uL — ABNORMAL LOW (ref 150–400)
Platelets: 140 10*3/uL — ABNORMAL LOW (ref 150–400)
RBC: 5.52 MIL/uL (ref 4.22–5.81)
RBC: 4.47 MIL/uL (ref 4.22–5.81)
RBC: 4.69 MIL/uL (ref 4.22–5.81)
RDW: 12.3 % (ref 11.5–15.5)
RDW: 12.6 % (ref 11.5–15.5)
RDW: 12.4 % (ref 11.5–15.5)
WBC: 12.5 10*3/uL — ABNORMAL HIGH (ref 4.0–10.5)
WBC: 20.1 10*3/uL — ABNORMAL HIGH (ref 4.0–10.5)
WBC: 19 10*3/uL — ABNORMAL HIGH (ref 4.0–10.5)
nRBC: 0 % (ref 0.0–0.2)
nRBC: 0 % (ref 0.0–0.2)
nRBC: 0 % (ref 0.0–0.2)

## 2022-04-22 LAB — POCT I-STAT 7, (LYTES, BLD GAS, ICA,H+H)
Acid-base deficit: 5 mmol/L — ABNORMAL HIGH (ref 0.0–2.0)
Acid-base deficit: 7 mmol/L — ABNORMAL HIGH (ref 0.0–2.0)
Acid-base deficit: 5 mmol/L — ABNORMAL HIGH (ref 0.0–2.0)
Bicarbonate: 22.4 mmol/L (ref 20.0–28.0)
Bicarbonate: 19.9 mmol/L — ABNORMAL LOW (ref 20.0–28.0)
Bicarbonate: 21.4 mmol/L (ref 20.0–28.0)
Calcium, Ion: 1.24 mmol/L (ref 1.15–1.40)
Calcium, Ion: 1.13 mmol/L — ABNORMAL LOW (ref 1.15–1.40)
Calcium, Ion: 1.12 mmol/L — ABNORMAL LOW (ref 1.15–1.40)
HCT: 44 % (ref 39.0–52.0)
HCT: 38 % — ABNORMAL LOW (ref 39.0–52.0)
HCT: 37 % — ABNORMAL LOW (ref 39.0–52.0)
Hemoglobin: 15 g/dL (ref 13.0–17.0)
Hemoglobin: 12.9 g/dL — ABNORMAL LOW (ref 13.0–17.0)
Hemoglobin: 12.6 g/dL — ABNORMAL LOW (ref 13.0–17.0)
O2 Saturation: 95 %
O2 Saturation: 96 %
O2 Saturation: 96 %
Patient temperature: 36
Patient temperature: 36.3
Patient temperature: 36.2
Potassium: 4 mmol/L (ref 3.5–5.1)
Potassium: 4.3 mmol/L (ref 3.5–5.1)
Potassium: 4.8 mmol/L (ref 3.5–5.1)
Sodium: 138 mmol/L (ref 135–145)
Sodium: 137 mmol/L (ref 135–145)
Sodium: 138 mmol/L (ref 135–145)
TCO2: 24 mmol/L (ref 22–32)
TCO2: 21 mmol/L — ABNORMAL LOW (ref 22–32)
TCO2: 23 mmol/L (ref 22–32)
pCO2 arterial: 45.4 mmHg (ref 32–48)
pCO2 arterial: 42.7 mmHg (ref 32–48)
pCO2 arterial: 40.3 mmHg (ref 32–48)
pH, Arterial: 7.296 — ABNORMAL LOW (ref 7.35–7.45)
pH, Arterial: 7.273 — ABNORMAL LOW (ref 7.35–7.45)
pH, Arterial: 7.328 — ABNORMAL LOW (ref 7.35–7.45)
pO2, Arterial: 79 mmHg — ABNORMAL LOW (ref 83–108)
pO2, Arterial: 87 mmHg (ref 83–108)
pO2, Arterial: 86 mmHg (ref 83–108)

## 2022-04-22 LAB — GLUCOSE, CAPILLARY
Glucose-Capillary: 103 mg/dL — ABNORMAL HIGH (ref 70–99)
Glucose-Capillary: 189 mg/dL — ABNORMAL HIGH (ref 70–99)
Glucose-Capillary: 133 mg/dL — ABNORMAL HIGH (ref 70–99)
Glucose-Capillary: 124 mg/dL — ABNORMAL HIGH (ref 70–99)

## 2022-04-22 LAB — URINALYSIS, ROUTINE W REFLEX MICROSCOPIC
Bacteria, UA: NONE SEEN
Bilirubin Urine: NEGATIVE
Glucose, UA: NEGATIVE mg/dL
Hgb urine dipstick: NEGATIVE
Ketones, ur: NEGATIVE mg/dL
Leukocytes,Ua: NEGATIVE
Nitrite: NEGATIVE
Protein, ur: NEGATIVE mg/dL
Specific Gravity, Urine: 1.013 (ref 1.005–1.030)
pH: 5 (ref 5.0–8.0)

## 2022-04-22 LAB — TYPE AND SCREEN
ABO/RH(D): O POS
Antibody Screen: NEGATIVE

## 2022-04-22 LAB — BLOOD GAS, ARTERIAL
Acid-base deficit: 2.9 mmol/L — ABNORMAL HIGH (ref 0.0–2.0)
Bicarbonate: 22 mmol/L (ref 20.0–28.0)
Drawn by: 358491
O2 Saturation: 98.8 %
Patient temperature: 37
pCO2 arterial: 38 mmHg (ref 32–48)
pH, Arterial: 7.37 (ref 7.35–7.45)
pO2, Arterial: 85 mmHg (ref 83–108)

## 2022-04-22 LAB — COMPREHENSIVE METABOLIC PANEL
ALT: 26 U/L (ref 0–44)
AST: 18 U/L (ref 15–41)
Albumin: 4 g/dL (ref 3.5–5.0)
Alkaline Phosphatase: 43 U/L (ref 38–126)
Anion gap: 8 (ref 5–15)
BUN: 14 mg/dL (ref 6–20)
CO2: 23 mmol/L (ref 22–32)
Calcium: 9.5 mg/dL (ref 8.9–10.3)
Chloride: 107 mmol/L (ref 98–111)
Creatinine, Ser: 1.06 mg/dL (ref 0.61–1.24)
GFR, Estimated: >60 mL/min (ref >60)
Glucose, Bld: 97 mg/dL (ref 70–99)
Potassium: 4.2 mmol/L (ref 3.5–5.1)
Sodium: 138 mmol/L (ref 135–145)
Total Bilirubin: 1.4 mg/dL — ABNORMAL HIGH (ref 0.3–1.2)
Total Protein: 6.8 g/dL (ref 6.5–8.1)

## 2022-04-22 LAB — BASIC METABOLIC PANEL
Anion gap: 14 (ref 5–15)
Anion gap: 7 (ref 5–15)
BUN: 12 mg/dL (ref 6–20)
BUN: 11 mg/dL (ref 6–20)
CO2: 17 mmol/L — ABNORMAL LOW (ref 22–32)
CO2: 23 mmol/L (ref 22–32)
Calcium: 8 mg/dL — ABNORMAL LOW (ref 8.9–10.3)
Calcium: 8.1 mg/dL — ABNORMAL LOW (ref 8.9–10.3)
Chloride: 107 mmol/L (ref 98–111)
Chloride: 108 mmol/L (ref 98–111)
Creatinine, Ser: 1.05 mg/dL (ref 0.61–1.24)
Creatinine, Ser: 0.96 mg/dL (ref 0.61–1.24)
GFR, Estimated: >60 mL/min (ref >60)
GFR, Estimated: >60 mL/min (ref >60)
Glucose, Bld: 182 mg/dL — ABNORMAL HIGH (ref 70–99)
Glucose, Bld: 138 mg/dL — ABNORMAL HIGH (ref 70–99)
Potassium: 3.7 mmol/L (ref 3.5–5.1)
Potassium: 3.8 mmol/L (ref 3.5–5.1)
Sodium: 138 mmol/L (ref 135–145)
Sodium: 138 mmol/L (ref 135–145)

## 2022-04-22 LAB — PROTIME-INR
INR: 1 (ref 0.8–1.2)
INR: 1.2 (ref 0.8–1.2)
INR: 1.2 (ref 0.8–1.2)
Prothrombin Time: 13.4 seconds (ref 11.4–15.2)
Prothrombin Time: 15.1 seconds (ref 11.4–15.2)
Prothrombin Time: 14.9 seconds (ref 11.4–15.2)

## 2022-04-22 LAB — APTT
aPTT: 29 seconds (ref 24–36)
aPTT: 29 seconds (ref 24–36)
aPTT: 28 seconds (ref 24–36)

## 2022-04-22 LAB — FIBRINOGEN: Fibrinogen: 304 mg/dL (ref 210–475)

## 2022-04-22 LAB — POCT ACTIVATED CLOTTING TIME
Activated Clotting Time: 197 seconds
Activated Clotting Time: 257 seconds
Activated Clotting Time: 125 seconds
Activated Clotting Time: 233 seconds

## 2022-04-22 LAB — MAGNESIUM: Magnesium: 1.4 mg/dL — ABNORMAL LOW (ref 1.7–2.4)

## 2022-04-22 LAB — ABO/RH: ABO/RH(D): O POS

## 2022-04-22 SURGERY — CREATION, BYPASS, ARTERIAL, AORTA TO FEMORAL, BILATERAL, USING GRAFT
Anesthesia: General | Site: Abdomen

## 2022-04-22 MED ORDER — PROPOFOL 10 MG/ML IV BOLUS
INTRAVENOUS | Status: DC | PRN
  Administered 2022-04-22: 20 mg via INTRAVENOUS
  Administered 2022-04-22: 150 mg via INTRAVENOUS

## 2022-04-22 MED ORDER — PROPOFOL 10 MG/ML IV BOLUS
INTRAVENOUS | Status: AC
  Filled 2022-04-22: qty 20

## 2022-04-22 MED ORDER — SODIUM CHLORIDE 0.9 % IV SOLN: INTRAVENOUS | Status: DC

## 2022-04-22 MED ORDER — OXYCODONE-ACETAMINOPHEN 5-325 MG PO TABS
1.0000 | ORAL_TABLET | ORAL | Status: DC | PRN
  Administered 2022-04-23: 1 via ORAL
  Administered 2022-04-24 (×2): 2 via ORAL
  Administered 2022-04-24: 1 via ORAL
  Administered 2022-04-24 – 2022-04-27 (×9): 2 via ORAL
  Filled 2022-04-22: qty 2
  Filled 2022-04-22: qty 1
  Filled 2022-04-22 (×7): qty 2
  Filled 2022-04-22: qty 1
  Filled 2022-04-22 (×3): qty 2

## 2022-04-22 MED ORDER — ROCURONIUM BROMIDE 10 MG/ML (PF) SYRINGE
PREFILLED_SYRINGE | INTRAVENOUS | Status: AC
  Filled 2022-04-22: qty 10

## 2022-04-22 MED ORDER — PANTOPRAZOLE SODIUM 40 MG PO TBEC: 40.0000 mg | DELAYED_RELEASE_TABLET | Freq: Every day | ORAL | Status: DC

## 2022-04-22 MED ORDER — DEXMEDETOMIDINE HCL IN NACL 80 MCG/20ML IV SOLN
INTRAVENOUS | Status: DC | PRN
  Administered 2022-04-22 (×2): 4 ug via BUCCAL
  Administered 2022-04-22: 12 ug via BUCCAL

## 2022-04-22 MED ORDER — FENTANYL CITRATE (PF) 250 MCG/5ML IJ SOLN
INTRAMUSCULAR | Status: AC
  Filled 2022-04-22: qty 5

## 2022-04-22 MED ORDER — ROCURONIUM BROMIDE 10 MG/ML (PF) SYRINGE
PREFILLED_SYRINGE | INTRAVENOUS | Status: DC | PRN
  Administered 2022-04-22: 30 mg via INTRAVENOUS
  Administered 2022-04-22: 10 mg via INTRAVENOUS
  Administered 2022-04-22: 50 mg via INTRAVENOUS
  Administered 2022-04-22: 70 mg via INTRAVENOUS
  Administered 2022-04-22: 30 mg via INTRAVENOUS

## 2022-04-22 MED ORDER — 0.9 % SODIUM CHLORIDE (POUR BTL) OPTIME
TOPICAL | Status: DC | PRN
  Administered 2022-04-22 (×2): 1000 mL

## 2022-04-22 MED ORDER — CHLORHEXIDINE GLUCONATE CLOTH 2 % EX PADS
6.0000 | MEDICATED_PAD | Freq: Every day | CUTANEOUS | Status: DC
  Administered 2022-04-23 – 2022-04-27 (×6): 6 via TOPICAL

## 2022-04-22 MED ORDER — HEPARIN SODIUM (PORCINE) 1000 UNIT/ML IJ SOLN
INTRAMUSCULAR | Status: AC
  Filled 2022-04-22: qty 10

## 2022-04-22 MED ORDER — CHLORHEXIDINE GLUCONATE 0.12 % MT SOLN
15.0000 mL | Freq: Once | OROMUCOSAL | Status: AC
  Administered 2022-04-22: 15 mL via OROMUCOSAL
  Filled 2022-04-22: qty 15

## 2022-04-22 MED ORDER — MORPHINE SULFATE (PF) 4 MG/ML IV SOLN
4.0000 mg | INTRAVENOUS | Status: DC | PRN
  Administered 2022-04-22 – 2022-04-24 (×10): 4 mg via INTRAVENOUS
  Filled 2022-04-22 (×10): qty 1

## 2022-04-22 MED ORDER — PROTAMINE SULFATE 10 MG/ML IV SOLN
INTRAVENOUS | Status: DC | PRN
  Administered 2022-04-22: 50 mg via INTRAVENOUS

## 2022-04-22 MED ORDER — SODIUM BICARBONATE 8.4 % IV SOLN
INTRAVENOUS | Status: DC | PRN
  Administered 2022-04-22: 25 meq via INTRAVENOUS

## 2022-04-22 MED ORDER — ONDANSETRON HCL 4 MG/2ML IJ SOLN
INTRAMUSCULAR | Status: AC
  Filled 2022-04-22: qty 2

## 2022-04-22 MED ORDER — MIDAZOLAM HCL 2 MG/2ML IJ SOLN
INTRAMUSCULAR | Status: DC | PRN
  Administered 2022-04-22: 2 mg via INTRAVENOUS

## 2022-04-22 MED ORDER — MAGNESIUM SULFATE 2 GM/50ML IV SOLN
2.0000 g | Freq: Every day | INTRAVENOUS | Status: AC | PRN
  Administered 2022-04-23: 2 g via INTRAVENOUS
  Filled 2022-04-22: qty 50

## 2022-04-22 MED ORDER — CEFAZOLIN SODIUM-DEXTROSE 2-4 GM/100ML-% IV SOLN
2.0000 g | Freq: Three times a day (TID) | INTRAVENOUS | Status: AC
  Administered 2022-04-22 (×2): 2 g via INTRAVENOUS
  Filled 2022-04-22 (×2): qty 100

## 2022-04-22 MED ORDER — HEMOSTATIC AGENTS (NO CHARGE) OPTIME
TOPICAL | Status: DC | PRN
  Administered 2022-04-22 (×8): 1 via TOPICAL

## 2022-04-22 MED ORDER — PANTOPRAZOLE SODIUM 40 MG IV SOLR
40.0000 mg | INTRAVENOUS | Status: DC
  Administered 2022-04-22 – 2022-04-26 (×5): 40 mg via INTRAVENOUS
  Filled 2022-04-22 (×5): qty 10

## 2022-04-22 MED ORDER — FENTANYL CITRATE (PF) 250 MCG/5ML IJ SOLN
INTRAMUSCULAR | Status: DC | PRN
  Administered 2022-04-22 (×6): 50 ug via INTRAVENOUS
  Administered 2022-04-22: 150 ug via INTRAVENOUS
  Administered 2022-04-22: 50 ug via INTRAVENOUS

## 2022-04-22 MED ORDER — CHLORHEXIDINE GLUCONATE CLOTH 2 % EX PADS: 6.0000 | MEDICATED_PAD | Freq: Once | CUTANEOUS | Status: DC

## 2022-04-22 MED ORDER — SODIUM CHLORIDE 0.9 % IV SOLN: 500.0000 mL | Freq: Once | INTRAVENOUS | Status: DC | PRN

## 2022-04-22 MED ORDER — METOPROLOL TARTRATE 5 MG/5ML IV SOLN: 2.0000 mg | INTRAVENOUS | Status: DC | PRN

## 2022-04-22 MED ORDER — ALUM & MAG HYDROXIDE-SIMETH 200-200-20 MG/5ML PO SUSP
15.0000 mL | ORAL | Status: DC | PRN
  Administered 2022-04-25: 30 mL via ORAL
  Filled 2022-04-22: qty 30

## 2022-04-22 MED ORDER — ACETAMINOPHEN 325 MG PO TABS
325.0000 mg | ORAL_TABLET | ORAL | Status: DC | PRN
  Administered 2022-04-25: 650 mg via ORAL
  Filled 2022-04-22: qty 2

## 2022-04-22 MED ORDER — ALBUMIN HUMAN 5 % IV SOLN: INTRAVENOUS | Status: DC | PRN

## 2022-04-22 MED ORDER — DOCUSATE SODIUM 100 MG PO CAPS: 100.0000 mg | ORAL_CAPSULE | Freq: Every day | ORAL | Status: DC

## 2022-04-22 MED ORDER — DEXMEDETOMIDINE HCL IN NACL 80 MCG/20ML IV SOLN
INTRAVENOUS | Status: AC
  Filled 2022-04-22: qty 20

## 2022-04-22 MED ORDER — HEPARIN SODIUM (PORCINE) 5000 UNIT/ML IJ SOLN
5000.0000 [IU] | Freq: Three times a day (TID) | INTRAMUSCULAR | Status: DC
  Administered 2022-04-22 – 2022-04-24 (×5): 5000 [IU] via SUBCUTANEOUS
  Filled 2022-04-22 (×5): qty 1

## 2022-04-22 MED ORDER — LIDOCAINE 2% (20 MG/ML) 5 ML SYRINGE
INTRAMUSCULAR | Status: DC | PRN
  Administered 2022-04-22: 100 mg via INTRAVENOUS

## 2022-04-22 MED ORDER — INSULIN ASPART 100 UNIT/ML IJ SOLN: 0.0000 [IU] | INTRAMUSCULAR | Status: DC | PRN

## 2022-04-22 MED ORDER — MIDAZOLAM HCL 2 MG/2ML IJ SOLN
INTRAMUSCULAR | Status: AC
  Filled 2022-04-22: qty 2

## 2022-04-22 MED ORDER — HEMOSTATIC AGENTS (NO CHARGE) OPTIME
TOPICAL | Status: DC | PRN
  Administered 2022-04-22: 1 via TOPICAL

## 2022-04-22 MED ORDER — LACTATED RINGERS IV SOLN: INTRAVENOUS | Status: DC | PRN

## 2022-04-22 MED ORDER — LABETALOL HCL 5 MG/ML IV SOLN: 10.0000 mg | INTRAVENOUS | Status: DC | PRN

## 2022-04-22 MED ORDER — ONDANSETRON HCL 4 MG/2ML IJ SOLN: 4.0000 mg | Freq: Four times a day (QID) | INTRAMUSCULAR | Status: DC | PRN

## 2022-04-22 MED ORDER — HEPARIN SODIUM (PORCINE) 1000 UNIT/ML IJ SOLN
INTRAMUSCULAR | Status: DC | PRN
  Administered 2022-04-22: 3000 [IU] via INTRAVENOUS
  Administered 2022-04-22: 4000 [IU] via INTRAVENOUS
  Administered 2022-04-22: 8000 [IU] via INTRAVENOUS

## 2022-04-22 MED ORDER — HYDRALAZINE HCL 20 MG/ML IJ SOLN: 5.0000 mg | INTRAMUSCULAR | Status: DC | PRN

## 2022-04-22 MED ORDER — LIDOCAINE 2% (20 MG/ML) 5 ML SYRINGE
INTRAMUSCULAR | Status: AC
  Filled 2022-04-22: qty 5

## 2022-04-22 MED ORDER — SUGAMMADEX SODIUM 200 MG/2ML IV SOLN
INTRAVENOUS | Status: DC | PRN
  Administered 2022-04-22: 200 mg via INTRAVENOUS

## 2022-04-22 MED ORDER — PHENOL 1.4 % MT LIQD: 1.0000 | OROMUCOSAL | Status: DC | PRN

## 2022-04-22 MED ORDER — ONDANSETRON HCL 4 MG/2ML IJ SOLN
INTRAMUSCULAR | Status: DC | PRN
  Administered 2022-04-22: 4 mg via INTRAVENOUS

## 2022-04-22 MED ORDER — PHENYLEPHRINE HCL-NACL 20-0.9 MG/250ML-% IV SOLN
INTRAVENOUS | Status: DC | PRN
  Administered 2022-04-22: 25 ug/min via INTRAVENOUS

## 2022-04-22 MED ORDER — SODIUM CHLORIDE 0.9% IV SOLUTION: Freq: Once | INTRAVENOUS | Status: DC

## 2022-04-22 MED ORDER — DEXAMETHASONE SODIUM PHOSPHATE 10 MG/ML IJ SOLN
INTRAMUSCULAR | Status: AC
  Filled 2022-04-22: qty 1

## 2022-04-22 MED ORDER — CEFAZOLIN SODIUM-DEXTROSE 2-4 GM/100ML-% IV SOLN
2.0000 g | INTRAVENOUS | Status: AC
  Administered 2022-04-22 (×2): 2 g via INTRAVENOUS
  Filled 2022-04-22: qty 100

## 2022-04-22 MED ORDER — ORAL CARE MOUTH RINSE: 15.0000 mL | Freq: Once | OROMUCOSAL | Status: AC

## 2022-04-22 MED ORDER — HEPARIN 6000 UNIT IRRIGATION SOLUTION
Status: DC | PRN
  Administered 2022-04-22: 1

## 2022-04-22 MED ORDER — DEXAMETHASONE SODIUM PHOSPHATE 10 MG/ML IJ SOLN
INTRAMUSCULAR | Status: DC | PRN
  Administered 2022-04-22: 5 mg via INTRAVENOUS

## 2022-04-22 MED ORDER — INSULIN ASPART 100 UNIT/ML IJ SOLN
0.0000 [IU] | Freq: Three times a day (TID) | INTRAMUSCULAR | Status: DC
  Administered 2022-04-22 – 2022-04-23 (×2): 3 [IU] via SUBCUTANEOUS
  Administered 2022-04-23 – 2022-04-24 (×2): 2 [IU] via SUBCUTANEOUS
  Administered 2022-04-24: 3 [IU] via SUBCUTANEOUS
  Administered 2022-04-25 – 2022-04-26 (×3): 2 [IU] via SUBCUTANEOUS

## 2022-04-22 MED ORDER — POTASSIUM CHLORIDE CRYS ER 20 MEQ PO TBCR
20.0000 meq | EXTENDED_RELEASE_TABLET | Freq: Every day | ORAL | Status: AC | PRN
  Administered 2022-04-27: 20 meq via ORAL
  Filled 2022-04-22: qty 2

## 2022-04-22 MED ORDER — GUAIFENESIN-DM 100-10 MG/5ML PO SYRP: 15.0000 mL | ORAL_SOLUTION | ORAL | Status: DC | PRN

## 2022-04-22 MED ORDER — CHLORHEXIDINE GLUCONATE CLOTH 2 % EX PADS
6.0000 | MEDICATED_PAD | Freq: Once | CUTANEOUS | Status: AC
  Administered 2022-04-22: 6 via TOPICAL

## 2022-04-22 MED ORDER — ACETAMINOPHEN 325 MG RE SUPP: 325.0000 mg | RECTAL | Status: DC | PRN

## 2022-04-22 MED ORDER — LACTATED RINGERS IV SOLN: INTRAVENOUS | Status: DC

## 2022-04-22 MED ORDER — ACETAMINOPHEN 500 MG PO TABS
1000.0000 mg | ORAL_TABLET | Freq: Once | ORAL | Status: AC
  Administered 2022-04-22: 1000 mg via ORAL
  Filled 2022-04-22: qty 2

## 2022-04-22 SURGICAL SUPPLY — 54 items
BAG COUNTER SPONGE SURGICOUNT (BAG) IMPLANT
CANISTER SUCT 3000ML PPV (MISCELLANEOUS) ×1 IMPLANT
CLIP LIGATING EXTRA MED SLVR (CLIP) ×1 IMPLANT
CLIP LIGATING EXTRA SM BLUE (MISCELLANEOUS) ×2 IMPLANT
COVER LIGHT HANDLE UNIVERSAL (MISCELLANEOUS) IMPLANT
DERMABOND ADVANCED .7 DNX12 (GAUZE/BANDAGES/DRESSINGS) ×3 IMPLANT
ELECT BLADE 4.0 EZ CLEAN MEGAD (MISCELLANEOUS)
ELECT BLADE 6.5 EXT (BLADE) IMPLANT
ELECT REM PT RETURN 9FT ADLT (ELECTROSURGICAL) ×1
ELECTRODE BLDE 4.0 EZ CLN MEGD (MISCELLANEOUS) IMPLANT
ELECTRODE REM PT RTRN 9FT ADLT (ELECTROSURGICAL) ×1 IMPLANT
FELT TEFLON 1X6 (MISCELLANEOUS) IMPLANT
GLOVE BIO SURGEON STRL SZ7.5 (GLOVE) ×1 IMPLANT
GLOVE BIOGEL PI IND STRL 8 (GLOVE) ×1 IMPLANT
GLOVE SRG 8 PF TXTR STRL LF DI (GLOVE) ×1 IMPLANT
GLOVE SURG UNDER POLY LF SZ8 (GLOVE) ×7
GOWN STRL REIN XL XLG (GOWN DISPOSABLE) IMPLANT
GOWN STRL REUS W/ TWL LRG LVL3 (GOWN DISPOSABLE) ×3 IMPLANT
GOWN STRL REUS W/TWL 2XL LVL3 (GOWN DISPOSABLE) ×2 IMPLANT
GOWN STRL REUS W/TWL LRG LVL3 (GOWN DISPOSABLE) ×3
GRAFT HEMASHIELD 16X8MM (Vascular Products) IMPLANT
HEMOSTAT SNOW SURGICEL 2X4 (HEMOSTASIS) IMPLANT
INSERT FOGARTY 61MM (MISCELLANEOUS) ×1 IMPLANT
INSERT FOGARTY SM (MISCELLANEOUS) ×2 IMPLANT
KIT BASIN OR (CUSTOM PROCEDURE TRAY) ×1 IMPLANT
KIT TURNOVER KIT B (KITS) ×1 IMPLANT
LOOP VESSEL MAXI BLUE (MISCELLANEOUS) IMPLANT
LOOP VESSEL MINI RED (MISCELLANEOUS) IMPLANT
NS IRRIG 1000ML POUR BTL (IV SOLUTION) ×3 IMPLANT
PACK AORTA (CUSTOM PROCEDURE TRAY) ×1 IMPLANT
PAD ARMBOARD 7.5X6 YLW CONV (MISCELLANEOUS) ×2 IMPLANT
POWDER SURGICEL 3.0 GRAM (HEMOSTASIS) IMPLANT
RETAINER VISCERA MED (MISCELLANEOUS) ×1 IMPLANT
SPONGE TONSIL 1 RF SGL (DISPOSABLE) IMPLANT
SUT ETHIBOND 5 LR DA (SUTURE) IMPLANT
SUT MNCRL AB 4-0 PS2 18 (SUTURE) ×4 IMPLANT
SUT PDS AB 1 TP1 96 (SUTURE) ×2 IMPLANT
SUT PROLENE 3 0 SH1 36 (SUTURE) ×2 IMPLANT
SUT PROLENE 5 0 C 1 24 (SUTURE) ×2 IMPLANT
SUT PROLENE 5 0 C 1 36 (SUTURE) IMPLANT
SUT PROLENE 6 0 BV (SUTURE) IMPLANT
SUT SILK 2 0 (SUTURE)
SUT SILK 2 0 SH CR/8 (SUTURE) ×1 IMPLANT
SUT SILK 2-0 18XBRD TIE 12 (SUTURE) ×1 IMPLANT
SUT SILK 4 0 (SUTURE)
SUT SILK 4-0 18XBRD TIE 12 (SUTURE) ×1 IMPLANT
SUT VIC AB 2-0 CT1 27 (SUTURE) ×5
SUT VIC AB 2-0 CT1 TAPERPNT 27 (SUTURE) ×4 IMPLANT
SUT VIC AB 3-0 SH 27 (SUTURE) ×6
SUT VIC AB 3-0 SH 27X BRD (SUTURE) ×6 IMPLANT
TOWEL GREEN STERILE (TOWEL DISPOSABLE) ×1 IMPLANT
TOWEL ~~LOC~~+RFID 17X26 BLUE (SPONGE) ×1 IMPLANT
TRAY FOLEY MTR SLVR 16FR STAT (SET/KITS/TRAYS/PACK) ×1 IMPLANT
WATER STERILE IRR 1000ML POUR (IV SOLUTION) ×2 IMPLANT

## 2022-04-22 NOTE — H&P (Signed)
Office Note   Patient seen and examined in preop holding.  No complaints. No changes to medication history or physical exam since last seen in clinic. After discussing the risks and benefits of aortobifemoral bypass, Bryan Hull elected to proceed.   Bryan John MD   CC:  Abnormal ABI Requesting Provider:  No ref. provider found  HPI: Bryan Hull is a 46 y.o. (15-Oct-1975) male presenting in follow up with a 63-monthhistory of left lower extremity progressive short distance claudication.  He denies rest pain, tissue loss.  ABIs were reviewed demonstrating significantly decreased perfusion in the left lower extremity with monophasic waveforms in the tibial vessels.  He was nonpalpable at the left common femoral artery, left pedal vessels.  On exam today, Bryan Hull worried.  He is only able to ambulate roughly 30 yards prior to debilitating claudication.  He is noting rest pain at night, which wakes him up from sleep every 30 minutes.  This is appreciated both in the hip as well as the foot.  Bryan Hull becoming more concerned as his symptoms have progressed.  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  He is working toward smoking cessation.  He denies wounds on the feet.  Bryan Hull recently diagnosed with diabetes Medication includes daily aspirin, no statin  Past Medical History:  Diagnosis Date   Asthma    as a child/teenager   Diabetes mellitus without complication ([place]    Family history of factor V Leiden mutation    Peripheral vascular disease [place]    PAD    Past Surgical History:  Procedure Laterality Date   APPENDECTOMY     WISDOM TOOTH EXTRACTION      Social History   Socioeconomic History   Marital status: Married    Spouse name: Not on file   Number of children: 2   Years of education: Not on file   Highest education level: Not on file  Occupational History   Not on file  Tobacco Use    Smoking status: Every Day    Packs/day: 0.25    Types: Cigarettes    Start date: 05/21/1993   Smokeless tobacco: Never  Vaping Use   Vaping Use: Some days  Substance and Sexual Activity   Alcohol use: Yes    Comment: rare   Drug use: Yes    Types: Marijuana   Sexual activity: Yes    Birth control/protection: None  Other Topics Concern   Not on file  Social History Narrative   Not on file   Social Determinants of Health   Financial Resource Strain: Not on file  Food Insecurity: Not on file  Transportation Needs: Not on file  Physical Activity: Not on file  Stress: Not on file  Social Connections: Not on file  Intimate Partner Violence: Not on file   Family History  Problem Relation Age of Onset   Diabetes Father    Hyperlipidemia Father     Current Facility-Administered Medications  Medication Dose Route Frequency Provider Last Rate Last Admin   0.9 %  sodium chloride infusion (Manually program via Guardrails IV Fluids)   Intravenous Once EDagoberto Ligas PA-C       insulin aspart (novoLOG) injection 0-15 Units  0-15 Units Subcutaneous TID WC Eveland, Matthew, PA-C        No Known Allergies   REVIEW OF SYSTEMS:  '[X]'$  denotes positive finding, '[ ]'$   denotes negative finding Cardiac  Comments:  Chest pain or chest pressure:    Shortness of breath upon exertion:    Short of breath when lying flat:    Irregular heart rhythm:        Vascular    Pain in calf, thigh, or hip brought on by ambulation: X   Pain in feet at night that wakes you up from your sleep:     Blood clot in your veins:    Leg swelling:         Pulmonary    Oxygen at home:    Productive cough:     Wheezing:         Neurologic    Sudden weakness in arms or legs:     Sudden numbness in arms or legs:     Sudden onset of difficulty speaking or slurred speech:    Temporary loss of vision in one eye:     Problems with dizziness:         Gastrointestinal    Blood in stool:     Vomited blood:          Genitourinary    Burning when urinating:     Blood in urine:        Psychiatric    Major depression:         Hematologic    Bleeding problems:    Problems with blood clotting too easily:        Skin    Rashes or ulcers:        Constitutional    Fever or chills:      PHYSICAL EXAMINATION:  Vitals:   04/22/22 1400 04/22/22 1415 04/22/22 1430 04/22/22 1445  BP: 121/75 114/77 116/72   Pulse: 77 79 76   Resp: (!) '8 14 15   '$ Temp:    (!) 97.4 F (36.3 C)  SpO2: 95% 94% 95%   Weight:      Height:         General:  WDWN in NAD; vital signs documented above Gait: Not observed HENT: WNL, normocephalic Pulmonary: normal non-labored breathing , without wheezing Cardiac: regular HR, Abdomen: soft, NT, no masses Skin: without rashes Vascular Exam/Pulses:  Right Left  Radial 2+ (normal) 2+ (normal)  Ulnar 2+ (normal) 2+ (normal)  Femoral 2 0  Popliteal    DP 2+ (normal) absent  PT 1+ (weak) absent   Extremities: without ischemic changes, without Gangrene , without cellulitis; without open wounds;  Musculoskeletal: no muscle wasting or atrophy  Neurologic: A&O X 3;  No focal weakness or paresthesias are detected Psychiatric:  The pt has Normal affect.   Non-Invasive Vascular Imaging:   ABI Findings:  +---------+------------------+-----+---------+--------+  Right    Rt Pressure (mmHg)IndexWaveform Comment   +---------+------------------+-----+---------+--------+  Brachial 130                                       +---------+------------------+-----+---------+--------+  PTA      130               1.00 triphasic          +---------+------------------+-----+---------+--------+  DP       128               0.98 triphasic          +---------+------------------+-----+---------+--------+  Esau Grew  0.64                    +---------+------------------+-----+---------+--------+    +---------+------------------+-----+----------+-------+  Left     Lt Pressure (mmHg)IndexWaveform  Comment  +---------+------------------+-----+----------+-------+  Brachial 125                                       +---------+------------------+-----+----------+-------+  PTA      82                0.63 monophasic         +---------+------------------+-----+----------+-------+  DP       69                0.53 monophasic         +---------+------------------+-----+----------+-------+  Great Toe30                0.23                    +---------+------------------+-----+----------+-------+   +-------+-----------+-----------+------------+------------+  ABI/TBIToday's ABIToday's TBIPrevious ABIPrevious TBI  +-------+-----------+-----------+------------+------------+  Right  1.00       0.64                                 +-------+-----------+-----------+------------+------------+  Left   0.63       0.23                                    ASSESSMENT/PLAN: JADIN CREQUE is a 46 y.o. male presenting with a 6 to 90-monthhistory of left lower extremity short distance claudication, which has now progressed to rest pain.  BSamuellcan only sleep 30 minutes at a time before waking up in severe pain in the hip leg.  He denies tissue loss in the feet, does appreciate some paresthesias.  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  ABIs were reviewed demonstrating significantly decreased perfusion in the left lower extremity with monophasic waveforms in the tibial vessels.  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    I had a long conversation with him regarding the above.  The lesion would be best treated from an open approach as stenting would likely lead to kinking at the level of the inguinal ligament with suboptimal patency rate.  Repair would  also exclude the small infrarenal abdominal aneurysm.  BNickiI discussed aorto bifemoral pass versus Aorto-right iliac, left femoral bypass.  I quoted a 5% mortality, 10% morbidity.  BMarshallis working on smoking cessation.  Medications include statin, aspirin.  Even with his young age, I would appreciate BHodgesseeing a cardiologist for preoperative risk assessment due to his known peripheral arterial disease.  Discussing the risks and benefits, BWeslyelected to proceed.   JBroadus John MD [facility] [phone]

## 2022-04-22 NOTE — Transfer of Care (Signed)
Immediate Anesthesia Transfer of Care Note  Patient: Bryan Hull  Procedure(s) Performed: AORTOBIFEMORAL BYPASS GRAFT (Abdomen) APPLICATION OF CELL SAVER (Abdomen)  Patient Location: PACU  Anesthesia Type:General  Level of Consciousness: awake, alert  and oriented  Airway & Oxygen Therapy: Patient Spontanous Breathing and Patient connected to face mask oxygen  Post-op Assessment: Report given to RN, Post -op Vital signs reviewed and stable and Patient moving all extremities X 4  Post vital signs: Reviewed and stable  Last Vitals:  Vitals Value Taken Time  BP    Temp    Pulse 110 04/22/22 1250  Resp 16 04/22/22 1250  SpO2 95 % 04/22/22 1250  Vitals shown include unvalidated device data.  Last Pain:  Vitals:   04/22/22 0613  PainSc: 4       Patients Stated Pain Goal: 2 (36/62/94 7654)  Complications: No notable events documented.

## 2022-04-22 NOTE — Progress Notes (Signed)
  Day of Surgery Note    Subjective:  no complaints; resting comfortably   Vitals:   04/22/22 1330 04/22/22 1345  BP: 132/81 129/77  Pulse: 82 80  Resp: 10 (!) 24  Temp:    SpO2: 96% 93%    Incisions:   bilateral groin and midline incisions are clean and dry Extremities:  brisk doppler flow bilateral DP/PT Cardiac:  regular Lungs:  non labored Abdomen:  soft, non distended   Assessment/Plan:  This is a 46 y.o. male who is s/p  End to end aortobifemoral bypass, 16 x 18m bifurcated dacryon graft Inferior mesenteric vein ligation Right common femoral artery endarterectomy  -pt with brisk doppler flow bilateral DP/PT -hemodynamically stable -abdomen soft and non distending -laparotomy and bilateral groin incisions clean without hematoma -ok for bair hugger to be off for a bit given pt is hot and sweating -[facility].    SLeontine Locket PA-C 04/22/2022 2:19 PM [phone]

## 2022-04-22 NOTE — Anesthesia Procedure Notes (Signed)
Central Venous Catheter Insertion Performed by: Santa Lighter, MD, anesthesiologist Start/End9/25/2023 7:00 AM, 04/22/2022 7:10 AM Preanesthetic checklist: patient identified, IV checked, site marked, risks and benefits discussed, surgical consent, monitors and equipment checked, pre-op evaluation, timeout performed and anesthesia consent Position: Trendelenburg Lidocaine 1% used for infiltration and patient sedated Hand hygiene performed , maximum sterile barriers used  and Seldinger technique used Catheter size: 8 Fr Total catheter length 16. Central line was placed.Double lumen Procedure performed using ultrasound guided technique. Ultrasound Notes:anatomy identified, needle tip was noted to be adjacent to the nerve/plexus identified, no ultrasound evidence of intravascular and/or intraneural injection and image(s) printed for medical record Attempts: 1 Following insertion, line sutured, dressing applied and Biopatch. Post procedure assessment: blood return through all ports, free fluid flow and no air  Patient tolerated the procedure well with no immediate complications.

## 2022-04-22 NOTE — Op Note (Signed)
NAME: Bryan Hull    MRN: [id number] DOB: 1975/09/17    DATE OF OPERATION: 04/22/2022  PREOP DIAGNOSIS:    Left lower extremity critical limb ischemia with rest pain   POSTOP DIAGNOSIS:    Same  PROCEDURE:    End to end aortobifemoral bypass, 16 x 55m bifurcated dacryon graft Inferior mesenteric vein ligation Right common femoral artery endarterectomy  SURGEON: JBroadus John ASSIST: MDagoberto Ligas PA  ANESTHESIA:   General   EBL: 6550m INDICATIONS:    Bryan GRIESs a [age].o. male with a 6 to 8-57-monthstory of left lower extremity short distance claudication, which has now progressed to rest pain.  Bryan Hull only sleep 30 minutes at a time before waking up in severe pain in the hip leg.  He denies tissue loss in the feet, does appreciate some paresthesias.  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  ABIs were reviewed demonstrating significantly decreased perfusion in the left lower extremity with monophasic waveforms in the tibial vessels.  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     I had a long conversation with him regarding the above.  The lesion would be best treated from an open approach as stenting would requiring kissing iliac stents and likely lead to kinking at the level of the inguinal ligament with suboptimal patency rate.  Open repair would also exclude the small infrarenal abdominal aneurysm.  BriOlaoluwadiscussed aortobifemoral bypass versus Aorto-right iliac, left femoral bypass.  I quoted a 5% mortality, 10% morbidity.   Bryan Hull working on smoking cessation.  Medications include statin, aspirin. After discussing the risks and benefits, Bryan Hull to proceed.  FINDINGS:   Noncalcified infrarenal abdominal aorta was soft,mural thrombus, small infrarenal abdominal aneurysm.  Left iliac system occluded.  Soft  plaque appreciated in the right common femoral artery requiring endarterectomy.  TECHNIQUE:   Patient was brought to the OR laid in supine position.  General anesthesia was induced and the patient was prepped and draped in standard fashion.  The case began with ultrasound insonation of bilateral femoral arteries.  The common femoral bifurcation was marked and oblique incision was made in bilateral groins.  Began with the left groin, incision was taken down to the common femoral artery.  The common femoral artery, profunda, superficial femoral artery were controlled with the use of Vesseloops.  Next, I made a tunnel into the retroperitoneum bilaterally from the groin.  The crossing circumflex iliac vein was appreciated and ligated on the patient's right side.  This was not appreciated on the left side.  Once bilateral common femoral arteries were exposed and controlled, my attention turned to the abdomen.  A standard midline laparotomy was made followed by standard inframesocolic exposure of the infrarenal abdominal aorta.  Special care was taken to ensure there was no bowel injury.  Initially, my dissection was too lateral and the inferior mesenteric vein was encountered.  This was ligated with 2-0 silk suture, prior to moving more medially in the retroperitoneum.  The infrarenal abdominal aorta was exposed from the bilateral renal arteries to the aortic bifurcation.  Once exposed, I moved to control the infrarenal abdominal aorta using an umbilical tape immediately distal to the bilateral renal arteries.  Next, blunt dissection was used to create tunnels in the retroperitoneum along the anterior surface of the iliac  arteries bilaterally.  Umbilical tape was used as a Retail banker.  [place] was heparinized and ACT greater than 250, of the infrarenal abdominal aorta was controlled immediately proximal to the inferior mesenteric artery, and distal to the renal arteries.  Once clamped, the aorta was transected  using scissors.  The filter was left placed on top.  The small infrarenal abdominal aneurysm was opened longitudinally no significant backbleeding was appreciated.  The aortic cuff immediately proximal to the inferior mesenteric artery was oversewn using 3-0 Prolene suture.  Next, my attention turned to the proximal neck.  3-0 Prolene was used in running fashion to sew the 16 x 8 bifurcated Dacron graft to the aorta end-to-end.  Next, limbs were tunneled using the previously placed umbilical tape.  Limbs were sewn in  end-to-side fashion to the common femoral arteries using 5-0 Prolene suture in running fashion.  The left was first, followed on the right.  The right anastomosis required common femoral endarterectomy for soft mural thrombus.  Prior to completion, arteries and the bifurcated dacryon graft were backbled to ensure there was no thrombus.  Upon completion, there was a palpable pulse in the foot with excellent signals distally.  Heparin was reversed.  Hemostasis was achieved with use of cautery and thrombin product.  The retroperitoneum was closed using 2-0 Vicryl suture with special care to ensure the duodenum was not injured.  All bowel was viable.  The transverse colon was replaced along with the omentum over the small bowel.  Next, bilateral groins were closed using layers of 2-0 Vicryl suture with 4-0 Monocryl at the level of the skin and Dermabond.  The laparotomy was closed using #1 looped PDS followed by Vicryl suture, 4-0 Monocryl and Dermabond at the level of the skin.  Patient was palpable in bilateral feet at case completion.  Macie Burows, MD [facility] DATE OF DICTATION:   04/22/2022

## 2022-04-22 NOTE — Anesthesia Procedure Notes (Signed)
Procedure Name: Intubation Date/Time: 04/22/2022 7:46 AM  Performed by: Gaylene Brooks, CRNAPre-anesthesia Checklist: Patient identified, Emergency Drugs available, Suction available and Patient being monitored Patient Re-evaluated:Patient Re-evaluated prior to induction Oxygen Delivery Method: Circle System Utilized Preoxygenation: Pre-oxygenation with 100% oxygen Induction Type: IV induction Ventilation: Mask ventilation without difficulty Laryngoscope Size: Miller and 2 Grade View: Grade I Tube type: Oral Number of attempts: 1 Airway Equipment and Method: Stylet and Oral airway Placement Confirmation: ETT inserted through vocal cords under direct vision, positive ETCO2 and breath sounds checked- equal and bilateral Secured at: 24 cm Tube secured with: Tape Dental Injury: Teeth and Oropharynx as per pre-operative assessment

## 2022-04-22 NOTE — Progress Notes (Signed)
Office Note   Patient seen and examined in preop holding.  No complaints. No changes to medication history or physical exam since last seen in clinic. After discussing the risks and benefits of aortobifemoral bypass, Bryan Hull elected to proceed.   Bryan John MD   CC:  Abnormal ABI Requesting Provider:  No ref. provider found  HPI: Bryan Hull is a 46 y.o. ([birth year]) male presenting in follow up with a 17-monthhistory of left lower extremity progressive short distance claudication.  He denies rest pain, tissue loss.  ABIs were reviewed demonstrating significantly decreased perfusion in the left lower extremity with monophasic waveforms in the tibial vessels.  He was nonpalpable at the left common femoral artery, left pedal vessels.  On exam today, Bryan Hull worried.  He is only able to ambulate roughly 30 yards prior to debilitating claudication.  He is noting rest pain at night, which wakes him up from sleep every 30 minutes.  This is appreciated both in the hip as well as the foot.  Bryan Hull becoming more concerned as his symptoms have progressed.  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  He is working toward smoking cessation.  He denies wounds on the feet.  Bryan Hull recently diagnosed with diabetes Medication includes daily aspirin, no statin  Past Medical History:  Diagnosis Date   Asthma    as a child/teenager   Diabetes mellitus without complication ([facility]    Family history of factor V Leiden mutation    Peripheral vascular disease ([place]    PAD    Past Surgical History:  Procedure Laterality Date   APPENDECTOMY     WISDOM TOOTH EXTRACTION      Social History   Socioeconomic History   Marital status: Married    Spouse name: Not on file   Number of children: 2   Years of education: Not on file   Highest education level: Not on file  Occupational History   Not on file  Tobacco Use    Smoking status: Every Day    Packs/day: 0.25    Types: Cigarettes    Start date: 05/21/1993   Smokeless tobacco: Never  Vaping Use   Vaping Use: Some days  Substance and Sexual Activity   Alcohol use: Yes    Comment: rare   Drug use: Yes    Types: Marijuana   Sexual activity: Yes    Birth control/protection: None  Other Topics Concern   Not on file  Social History Narrative   Not on file   Social Determinants of Health   Financial Resource Strain: Not on file  Food Insecurity: Not on file  Transportation Needs: Not on file  Physical Activity: Not on file  Stress: Not on file  Social Connections: Not on file  Intimate Partner Violence: Not on file   Family History  Problem Relation Age of Onset   Diabetes Father    Hyperlipidemia Father     Current Facility-Administered Medications  Medication Dose Route Frequency Provider Last Rate Last Admin   0.9 %  sodium chloride infusion   Intravenous Continuous RBroadus John MD       ceFAZolin (ANCEF) IVPB 2g/100 mL premix  2 g Intravenous To SS-Surg RBroadus John MD       Chlorhexidine Gluconate Cloth 2 % PADS 6 each  6 each Topical Once RBroadus John MD  insulin aspart (novoLOG) injection 0-14 Units  0-14 Units Subcutaneous Q2H PRN Bryan John, MD       lactated ringers infusion   Intravenous Continuous Santa Lighter, MD        No Known Allergies   REVIEW OF SYSTEMS:  '[X]'$  denotes positive finding, '[ ]'$  denotes negative finding Cardiac  Comments:  Chest pain or chest pressure:    Shortness of breath upon exertion:    Short of breath when lying flat:    Irregular heart rhythm:        Vascular    Pain in calf, thigh, or hip brought on by ambulation: X   Pain in feet at night that wakes you up from your sleep:     Blood clot in your veins:    Leg swelling:         Pulmonary    Oxygen at home:    Productive cough:     Wheezing:         Neurologic    Sudden weakness in arms or legs:      Sudden numbness in arms or legs:     Sudden onset of difficulty speaking or slurred speech:    Temporary loss of vision in one eye:     Problems with dizziness:         Gastrointestinal    Blood in stool:     Vomited blood:         Genitourinary    Burning when urinating:     Blood in urine:        Psychiatric    Major depression:         Hematologic    Bleeding problems:    Problems with blood clotting too easily:        Skin    Rashes or ulcers:        Constitutional    Fever or chills:      PHYSICAL EXAMINATION:  Vitals:   04/22/22 0623  BP: 110/84  Pulse: 70  Resp: 18  Temp: 98.3 F (36.8 C)  SpO2: 93%  Weight: 98.4 kg  Height: '6\' 2"'$  (1.88 m)     General:  WDWN in NAD; vital signs documented above Gait: Not observed HENT: WNL, normocephalic Pulmonary: normal non-labored breathing , without wheezing Cardiac: regular HR, Abdomen: soft, NT, no masses Skin: without rashes Vascular Exam/Pulses:  Right Left  Radial 2+ (normal) 2+ (normal)  Ulnar 2+ (normal) 2+ (normal)  Femoral 2 0  Popliteal    DP 2+ (normal) absent  PT 1+ (weak) absent   Extremities: without ischemic changes, without Gangrene , without cellulitis; without open wounds;  Musculoskeletal: no muscle wasting or atrophy  Neurologic: A&O X 3;  No focal weakness or paresthesias are detected Psychiatric:  The pt has Normal affect.   Non-Invasive Vascular Imaging:   ABI Findings:  +---------+------------------+-----+---------+--------+  Right    Rt Pressure (mmHg)IndexWaveform Comment   +---------+------------------+-----+---------+--------+  Brachial 130                                       +---------+------------------+-----+---------+--------+  PTA      130               1.00 triphasic          +---------+------------------+-----+---------+--------+  DP       128  0.98 triphasic          +---------+------------------+-----+---------+--------+   Great Toe83                0.64                    +---------+------------------+-----+---------+--------+   +---------+------------------+-----+----------+-------+  Left     Lt Pressure (mmHg)IndexWaveform  Comment  +---------+------------------+-----+----------+-------+  Brachial 125                                       +---------+------------------+-----+----------+-------+  PTA      82                0.63 monophasic         +---------+------------------+-----+----------+-------+  DP       69                0.53 monophasic         +---------+------------------+-----+----------+-------+  Great Toe30                0.23                    +---------+------------------+-----+----------+-------+   +-------+-----------+-----------+------------+------------+  ABI/TBIToday's ABIToday's TBIPrevious ABIPrevious TBI  +-------+-----------+-----------+------------+------------+  Right  1.00       0.64                                 +-------+-----------+-----------+------------+------------+  Left   0.63       0.23                                    ASSESSMENT/PLAN: Bryan Hull is a 46 y.o. male presenting with a 6 to 14-monthhistory of left lower extremity short distance claudication, which has now progressed to rest pain.  Bryan Hull only sleep 30 minutes at a time before waking up in severe pain in the hip leg.  He denies tissue loss in the feet, does appreciate some paresthesias.  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  ABIs were reviewed demonstrating significantly decreased perfusion in the left lower extremity with monophasic waveforms in the tibial vessels.  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    I had a long conversation with him regarding the above.  The lesion would be best treated from an open approach as  stenting would likely lead to kinking at the level of the inguinal ligament with suboptimal patency rate.  Repair would also exclude the small infrarenal abdominal aneurysm.  BKeghanI discussed aorto bifemoral pass versus Aorto-right iliac, left femoral bypass.  I quoted a 5% mortality, 10% morbidity.  BGraydenis working on smoking cessation.  Medications include statin, aspirin.  Even with his young age, I would appreciate BJilesseeing a cardiologist for preoperative risk assessment due to his known peripheral arterial disease.  Discussing the risks and benefits, BBoleslawelected to proceed.   JBroadus John MD [facility] [phone]

## 2022-04-22 NOTE — Anesthesia Procedure Notes (Signed)
Arterial Line Insertion Start/End9/25/2023 7:00 AM, 04/22/2022 7:15 AM Performed by: Gaylene Brooks, CRNA  Patient location: Pre-op. Preanesthetic checklist: patient identified, IV checked, site marked, risks and benefits discussed, surgical consent, monitors and equipment checked, pre-op evaluation, timeout performed and anesthesia consent Lidocaine 1% used for infiltration and patient sedated Left, radial was placed Catheter size: 20 G Hand hygiene performed  and maximum sterile barriers used   Attempts: 2 Procedure performed without using ultrasound guided technique. Following insertion, dressing applied and Biopatch. Post procedure assessment: normal  Patient tolerated the procedure well with no immediate complications.

## 2022-04-23 ENCOUNTER — Inpatient Hospital Stay (HOSPITAL_COMMUNITY): Payer: 59

## 2022-04-23 ENCOUNTER — Encounter (HOSPITAL_COMMUNITY): Payer: Self-pay | Admitting: Vascular Surgery

## 2022-04-23 LAB — BPAM FFP
Blood Product Expiration Date: 202309252359
ISSUE DATE / TIME: 202309251133
Unit Type and Rh: 6200

## 2022-04-23 LAB — CBC
HCT: 39.9 % (ref 39.0–52.0)
HCT: 43.2 % (ref 39.0–52.0)
HCT: 40.6 % (ref 39.0–52.0)
Hemoglobin: 14.1 g/dL (ref 13.0–17.0)
Hemoglobin: 15.2 g/dL (ref 13.0–17.0)
Hemoglobin: 14.5 g/dL (ref 13.0–17.0)
MCH: 31.3 pg (ref 26.0–34.0)
MCH: 31.3 pg (ref 26.0–34.0)
MCH: 31.8 pg (ref 26.0–34.0)
MCHC: 35.3 g/dL (ref 30.0–36.0)
MCHC: 35.2 g/dL (ref 30.0–36.0)
MCHC: 35.7 g/dL (ref 30.0–36.0)
MCV: 88.7 fL (ref 80.0–100.0)
MCV: 88.9 fL (ref 80.0–100.0)
MCV: 89 fL (ref 80.0–100.0)
Platelets: 130 10*3/uL — ABNORMAL LOW (ref 150–400)
Platelets: 165 10*3/uL (ref 150–400)
Platelets: 142 10*3/uL — ABNORMAL LOW (ref 150–400)
RBC: 4.5 MIL/uL (ref 4.22–5.81)
RBC: 4.86 MIL/uL (ref 4.22–5.81)
RBC: 4.56 MIL/uL (ref 4.22–5.81)
RDW: 12.5 % (ref 11.5–15.5)
RDW: 12.6 % (ref 11.5–15.5)
RDW: 12.8 % (ref 11.5–15.5)
WBC: 16 10*3/uL — ABNORMAL HIGH (ref 4.0–10.5)
WBC: 21.2 10*3/uL — ABNORMAL HIGH (ref 4.0–10.5)
WBC: 15.7 10*3/uL — ABNORMAL HIGH (ref 4.0–10.5)
nRBC: 0 % (ref 0.0–0.2)
nRBC: 0 % (ref 0.0–0.2)
nRBC: 0 % (ref 0.0–0.2)

## 2022-04-23 LAB — PREPARE CRYOPRECIPITATE: Unit division: 0

## 2022-04-23 LAB — PREPARE FRESH FROZEN PLASMA

## 2022-04-23 LAB — BASIC METABOLIC PANEL
Anion gap: 7 (ref 5–15)
Anion gap: 10 (ref 5–15)
Anion gap: 7 (ref 5–15)
BUN: 10 mg/dL (ref 6–20)
BUN: 11 mg/dL (ref 6–20)
BUN: 10 mg/dL (ref 6–20)
CO2: 23 mmol/L (ref 22–32)
CO2: 23 mmol/L (ref 22–32)
CO2: 25 mmol/L (ref 22–32)
Calcium: 8.3 mg/dL — ABNORMAL LOW (ref 8.9–10.3)
Calcium: 8.5 mg/dL — ABNORMAL LOW (ref 8.9–10.3)
Calcium: 8.5 mg/dL — ABNORMAL LOW (ref 8.9–10.3)
Chloride: 108 mmol/L (ref 98–111)
Chloride: 106 mmol/L (ref 98–111)
Chloride: 108 mmol/L (ref 98–111)
Creatinine, Ser: 0.99 mg/dL (ref 0.61–1.24)
Creatinine, Ser: 1.06 mg/dL (ref 0.61–1.24)
Creatinine, Ser: 0.83 mg/dL (ref 0.61–1.24)
GFR, Estimated: >60 mL/min (ref >60)
GFR, Estimated: >60 mL/min (ref >60)
GFR, Estimated: >60 mL/min (ref >60)
Glucose, Bld: 116 mg/dL — ABNORMAL HIGH (ref 70–99)
Glucose, Bld: 167 mg/dL — ABNORMAL HIGH (ref 70–99)
Glucose, Bld: 126 mg/dL — ABNORMAL HIGH (ref 70–99)
Potassium: 3.7 mmol/L (ref 3.5–5.1)
Potassium: 3.7 mmol/L (ref 3.5–5.1)
Potassium: 3.8 mmol/L (ref 3.5–5.1)
Sodium: 138 mmol/L (ref 135–145)
Sodium: 139 mmol/L (ref 135–145)
Sodium: 140 mmol/L (ref 135–145)

## 2022-04-23 LAB — PROTIME-INR
INR: 1.2 (ref 0.8–1.2)
INR: 1.2 (ref 0.8–1.2)
INR: 1.2 (ref 0.8–1.2)
Prothrombin Time: 15 seconds (ref 11.4–15.2)
Prothrombin Time: 14.6 seconds (ref 11.4–15.2)
Prothrombin Time: 14.9 seconds (ref 11.4–15.2)

## 2022-04-23 LAB — BPAM CRYOPRECIPITATE
Blood Product Expiration Date: 202309251630
ISSUE DATE / TIME: 202309251154
Unit Type and Rh: 5100

## 2022-04-23 LAB — APTT
aPTT: 23 seconds — ABNORMAL LOW (ref 24–36)
aPTT: 28 seconds (ref 24–36)
aPTT: 29 seconds (ref 24–36)

## 2022-04-23 LAB — GLUCOSE, CAPILLARY
Glucose-Capillary: 195 mg/dL — ABNORMAL HIGH (ref 70–99)
Glucose-Capillary: 168 mg/dL — ABNORMAL HIGH (ref 70–99)
Glucose-Capillary: 144 mg/dL — ABNORMAL HIGH (ref 70–99)
Glucose-Capillary: 120 mg/dL — ABNORMAL HIGH (ref 70–99)
Glucose-Capillary: 113 mg/dL — ABNORMAL HIGH (ref 70–99)

## 2022-04-23 LAB — FIBRINOGEN
Fibrinogen: 235 mg/dL (ref 210–475)
Fibrinogen: 481 mg/dL — ABNORMAL HIGH (ref 210–475)
Fibrinogen: 510 mg/dL — ABNORMAL HIGH (ref 210–475)
Fibrinogen: 565 mg/dL — ABNORMAL HIGH (ref 210–475)

## 2022-04-23 LAB — AMYLASE: Amylase: 51 U/L (ref 28–100)

## 2022-04-23 LAB — MAGNESIUM: Magnesium: 1.4 mg/dL — ABNORMAL LOW (ref 1.7–2.4)

## 2022-04-23 MED ORDER — POTASSIUM CHLORIDE 20 MEQ PO PACK
20.0000 meq | PACK | Freq: Once | ORAL | Status: AC
  Administered 2022-04-23: 20 meq
  Filled 2022-04-23: qty 1

## 2022-04-23 MED ORDER — HYDROMORPHONE HCL 1 MG/ML IJ SOLN
1.0000 mg | INTRAMUSCULAR | Status: DC | PRN
  Administered 2022-04-23 – 2022-04-26 (×27): 1 mg via INTRAVENOUS
  Filled 2022-04-23 (×28): qty 1

## 2022-04-23 MED ORDER — POTASSIUM CHLORIDE CRYS ER 20 MEQ PO TBCR: 20.0000 meq | EXTENDED_RELEASE_TABLET | Freq: Once | ORAL | Status: DC

## 2022-04-23 MED ORDER — DOCUSATE SODIUM 50 MG/5ML PO LIQD
100.0000 mg | Freq: Every day | ORAL | Status: DC
  Administered 2022-04-23 – 2022-04-25 (×3): 100 mg
  Filled 2022-04-23 (×3): qty 10

## 2022-04-23 NOTE — Anesthesia Postprocedure Evaluation (Signed)
Anesthesia Post Note  Patient: Bryan Hull  Procedure(s) Performed: AORTOBIFEMORAL BYPASS GRAFT (Abdomen) APPLICATION OF CELL SAVER (Abdomen)     Patient location during evaluation: PACU Anesthesia Type: General Level of consciousness: awake and alert Pain management: pain level controlled Vital Signs Assessment: post-procedure vital signs reviewed and stable Respiratory status: spontaneous breathing, nonlabored ventilation, respiratory function stable and patient connected to nasal cannula oxygen Cardiovascular status: blood pressure returned to baseline and stable Postop Assessment: no apparent nausea or vomiting Anesthetic complications: no   No notable events documented.  Last Vitals:  Vitals:   04/23/22 0800 04/23/22 0900  BP: 129/81 120/76  Pulse: 95 86  Resp: 17 18  Temp:    SpO2: 92% 94%    Last Pain:  Vitals:   04/23/22 0837  TempSrc:   PainSc: [place]

## 2022-04-23 NOTE — Evaluation (Signed)
Physical Therapy Evaluation Patient Details Name: Bryan Hull MRN: [id number] DOB: [birth year] Today's Date: 04/23/2022  History of Present Illness  Bryan Hull is a 46 y.o. male with a 6 to 13-monthhistory of left lower extremity short distance claudication, which has now progressed to rest pain.  BAddiecan only sleep 30 minutes at a time before waking up in severe pain in the hip leg.  He denies tissue loss in the feet, does appreciate some paresthesias.  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  ABIs were reviewed demonstrating significantly decreased perfusion in the left lower extremity with monophasic waveforms in the tibial vessels.  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  End to end aortobifemoral bypass, 16 x 887mbifurcated dacryon graft  Inferior mesenteric vein ligation  Right common femoral artery endarterectomy on 9/25.  Clinical Impression  Pt admitted with above diagnosis. Pt was able to ambulate with EVa walker into hallway with min guard assist. Pt limited distance as he experienced burning bil LEs with gait.   Pt should progress well.  Pt currently with functional limitations due to the deficits listed below (see PT Problem List). Pt will benefit from skilled PT to increase their independence and safety with mobility to allow discharge to the venue listed below.          Recommendations for follow up therapy are one component of a multi-disciplinary discharge planning process, led by the attending physician.  Recommendations may be updated based on patient status, additional functional criteria and insurance authorization.  Follow Up Recommendations Home health PT      Assistance Recommended at Discharge Intermittent Supervision/Assistance  Patient can return home with the following  A little help with walking and/or transfers;A little  help with bathing/dressing/bathroom;Assistance with cooking/housework;Assist for transportation;Help with stairs or ramp for entrance    Equipment Recommendations None recommended by PT  Recommendations for Other Services       Functional Status Assessment Patient has had a recent decline in their functional status and demonstrates the ability to make significant improvements in function in a reasonable and predictable amount of time.     Precautions / Restrictions Precautions Precautions: None Restrictions Weight Bearing Restrictions: No      Mobility  Bed Mobility Overal bed mobility: Needs Assistance Bed Mobility: Supine to Sit, Sit to Supine     Supine to sit: Min assist Sit to supine: Min guard   General bed mobility comments: A little assist for rolling technique as well as pt pulled up on PT hand to sit up due to pain in bil LEs with movement.  Did not need much help lying back down.    Transfers Overall transfer level: Needs assistance Equipment used:  (EHarmon Pieralker) Transfers: Sit to/from Stand Sit to Stand: Min guard           General transfer comment: Cues for hand placement and bed raised.  Pt is 6 feet 2 inches    Ambulation/Gait Ambulation/Gait assistance: Min guard, Min assist Gait Distance (Feet): 80 Feet Assistive device: EvEthelene Halait Pattern/deviations: Step-through pattern, Decreased stride length, Wide base of support, Drifts right/left, Antalgic   Gait velocity interpretation: 1.31 - 2.62 ft/sec, indicative of limited community ambulator   General Gait Details: Pt was able to ambulate with EVa walker with overall steady gait.  Pt limited by pain.  EvHarmon Pieralker  helped pt with pain as he experienced burning bil LEs off and on with ambulation.  Stairs            Wheelchair Mobility    Modified Rankin (Stroke Patients Only)       Balance Overall balance assessment: Needs assistance Sitting-balance support: No upper extremity  supported, Feet supported, Bilateral upper extremity supported Sitting balance-Leahy Scale: Fair     Standing balance support: During functional activity, Bilateral upper extremity supported Standing balance-Leahy Scale: Poor Standing balance comment: relies on UE support                             Pertinent Vitals/Pain Pain Assessment Pain Assessment: Faces Faces Pain Scale: Hurts even more Pain Location: incision Pain Descriptors / Indicators: Discomfort, Grimacing, Guarding, Burning Pain Intervention(s): Limited activity within patient's tolerance, Monitored during session, Premedicated before session, Repositioned    Home Living Family/patient expects to be discharged to:: Private residence Living Arrangements: Spouse/significant other Available Help at Discharge: Family;Available 24 hours/day (wife works nights, mom can help too) Type of Home: Mobile home Home Access: Stairs to enter Entrance Stairs-Rails: Right;Left;Can reach both Entrance Stairs-Number of Steps: 4   Home Layout: One level Home Equipment: Conservation officer, nature (2 wheels);BSC/3in1;Wheelchair - manual;Grab bars - tub/shower Additional Comments: Truck driver    Prior Function Prior Level of Function : Independent/Modified Independent             Mobility Comments: LEs numb but he could walk ADLs Comments: I PTA     Hand Dominance   Dominant Hand: Right    Extremity/Trunk Assessment   Upper Extremity Assessment Upper Extremity Assessment: Defer to OT evaluation    Lower Extremity Assessment Lower Extremity Assessment: Generalized weakness;RLE deficits/detail;LLE deficits/detail RLE: Unable to fully assess due to pain LLE: Unable to fully assess due to pain    Cervical / Trunk Assessment Cervical / Trunk Assessment: Normal  Communication   Communication: No difficulties  Cognition Arousal/Alertness: Awake/alert Behavior During Therapy: WFL for tasks assessed/performed Overall  Cognitive Status: Within Functional Limits for tasks assessed                                          General Comments General comments (skin integrity, edema, etc.): 90-117  bpm, 97% 2L, 140/85; removed O2 for walk and pt desat to 87% on RA with ambulation therefore replaced 2LO2 on arrival back to room.    Exercises     Assessment/Plan    PT Assessment Patient needs continued PT services  PT Problem List Decreased activity tolerance;Decreased balance;Decreased mobility;Decreased knowledge of use of DME;Decreased safety awareness;Decreased knowledge of precautions;Cardiopulmonary status limiting activity;Pain       PT Treatment Interventions DME instruction;Gait training;Functional mobility training;Therapeutic activities;Therapeutic exercise;Balance training;Patient/family education;Stair training    PT Goals (Current goals can be found in the Care Plan section)  Acute Rehab PT Goals Patient Stated Goal: to go home PT Goal Formulation: With patient Time For Goal Achievement: 05/07/22 Potential to Achieve Goals: Good    Frequency Min 4X/week     Co-evaluation               AM-PAC PT "6 Clicks" Mobility  Outcome Measure Help needed turning from your back to your side while in a flat bed without using bedrails?: A Little Help needed moving from lying on your back  to sitting on the side of a flat bed without using bedrails?: A Little Help needed moving to and from a bed to a chair (including a wheelchair)?: A Little Help needed standing up from a chair using your arms (e.g., wheelchair or bedside chair)?: A Little Help needed to walk in hospital room?: A Little Help needed climbing 3-5 steps with a railing? : A Little 6 Click Score: 18    End of Session Equipment Utilized During Treatment: Gait belt;Oxygen Activity Tolerance: Patient limited by fatigue;Patient limited by pain Patient left: in bed;with call bell/phone within reach;with bed alarm  set;with family/visitor present Nurse Communication: Mobility status PT Visit Diagnosis: Unsteadiness on feet (R26.81);Muscle weakness (generalized) (M62.81)    Time: 1740-8144 PT Time Calculation (min) (ACUTE ONLY): 45 min   Charges:   PT Evaluation $PT Eval Moderate Complexity: 1 Mod PT Treatments $Gait Training: 23-37 mins        [facility] M,PT Acute Rehab Services [phone]   Alvira Philips 04/23/2022, 8:44 AM

## 2022-04-23 NOTE — Progress Notes (Addendum)
  Progress Note    04/23/2022 7:09 AM 1 Day Post-Op  Subjective:  pain overall well controlled. No other complaints   Vitals:   04/23/22 0400 04/23/22 0500  BP: 118/74 117/77  Pulse: 72 74  Resp: 18 16  Temp:    SpO2: 97% 95%   Physical Exam: Cardiac:  regular Lungs:  non labored Incisions:  B groin incisions and laparotomy incision is clean, dry and intact without swelling or hematoma Extremities:  well perfused and warm with palpable DP/PT pulses Abdomen:  soft, non distended, expected tenderness Neurologic: alert and oriented  CBC    Component Value Date/Time   WBC 16.0 (H) 04/23/2022 0324   RBC 4.50 04/23/2022 0324   HGB 14.1 04/23/2022 0324   HCT 39.9 04/23/2022 0324   PLT 130 (L) 04/23/2022 0324   MCV 88.7 04/23/2022 0324   MCH 31.3 04/23/2022 0324   MCHC 35.3 04/23/2022 0324   RDW 12.5 04/23/2022 0324   LYMPHSABS 3.5 03/14/2022 0851   MONOABS 0.9 03/14/2022 0851   EOSABS 0.3 03/14/2022 0851   BASOSABS 0.1 03/14/2022 0851    BMET    Component Value Date/Time   NA 138 04/23/2022 0205   K 3.7 04/23/2022 0205   CL 108 04/23/2022 0205   CO2 23 04/23/2022 0205   GLUCOSE 116 (H) 04/23/2022 0205   BUN 10 04/23/2022 0205   CREATININE 0.99 04/23/2022 0205   CALCIUM 8.3 (L) 04/23/2022 0205   GFRNONAA >60 04/23/2022 0205    INR    Component Value Date/Time   INR 1.2 04/23/2022 0205     Intake/Output Summary (Last 24 hours) at 04/23/2022 0709 Last data filed at 04/23/2022 0700 Gross per 24 hour  Intake 7180.04 ml  Output 3930 ml  Net 3250.04 ml     Assessment/Plan:  46 y.o. male is s/p Aortobifemoral bypass, IMA vein ligation, R CF endarterectomy 1 Day Post-Op   Overall doing well post operatively Continue Pain control PRN Incisions are all intact and well appearing Lower extremities are well perfused and warm with palpable Dp/PT pulses VSS Hemodynamically stable Scr stable. Good UOP Not passing any flatus. Continue NGT. Keep NPO PT/OT to  eval today  DVT prophylaxis:  sq heparin   Karoline Caldwell, PA-C [facility] [phone] 04/23/2022 7:09 AM  VASCULAR STAFF ADDENDUM: I have independently interviewed and examined the patient. I agree with the above.   Assessment:  Progressing appropriately.  Palpable pulses in the feet.  Productive cough Incisions CDI Groins soft  Plan: OOB 3x daily.  Happy with ambulation w/ physical therapy this morning Needs SCDs, likely will start 500u heparin an hour tomorrow for known Factor V Leiden hetero Continue NGT, NPO, Continue mIVF - needs to be 176m/hr.  Labs can be q12h     JCassandria Santee MD [facility] CenterPhone Number: ([phone]/26/2023 9:47 AM

## 2022-04-23 NOTE — Evaluation (Signed)
Occupational Therapy Evaluation Patient Details Name: Bryan Hull MRN: [id number] DOB: 01-24-1976 Today's Date: 04/23/2022   History of Present Illness Bryan Hull is a 46 y.o. male who presented after 9 month hx of short distance LLE claudication. ABIs demonstrated significantly decreased perfusion in the LLE with monophasic waveforms in the tibial vessels.  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 PMHx: asthma, DM, PAD, tobacco use   Clinical Impression   PTA pt lives independently with his wife, enjoys dirt track racing and works driving a dump truck. Pt states he is actively trying to quit smoking. Making progress this session and able to ambulate @ 150 ft using EVA with max HR 121 with decreased c/o pain as compared to earlier PT session.  Currently requires assistance for ADL due to deficits listed below. Acute OT to follow however do not anticipate the need for OT follow up.      Recommendations for follow up therapy are one component of a multi-disciplinary discharge planning process, led by the attending physician.  Recommendations may be updated based on patient status, additional functional criteria and insurance authorization.   Follow Up Recommendations  No OT follow up    Assistance Recommended at Discharge Intermittent Supervision/Assistance  Patient can return home with the following A little help with walking and/or transfers;A little help with bathing/dressing/bathroom;Assistance with cooking/housework;Assist for transportation;Help with stairs or ramp for entrance    Functional Status Assessment  Patient has had a recent decline in their functional status and demonstrates the ability to make significant improvements in function in a reasonable and  predictable amount of time.  Equipment Recommendations  None recommended by OT    Recommendations for Other Services       Precautions / Restrictions Precautions Precautions: None Restrictions Weight Bearing Restrictions: No      Mobility Bed Mobility Overal bed mobility: Needs Assistance Bed Mobility: Supine to Sit, Sit to Supine     Supine to sit: Min guard, +2 for safety/equipment Sit to supine: Min assist        Transfers Overall transfer level: Needs assistance   Transfers: Sit to/from Stand Sit to Stand: Min guard                  Balance     Sitting balance-Leahy Scale: Good       Standing balance-Leahy Scale: Poor                             ADL either performed or assessed with clinical judgement   ADL Overall ADL's : Needs assistance/impaired Eating/Feeding: NPO   Grooming: Set up;Supervision/safety   Upper Body Bathing: Supervision/ safety;Set up;Sitting   Lower Body Bathing: Moderate assistance;Sit to/from stand   Upper Body Dressing : Set up;Supervision/safety   Lower Body Dressing: Moderate assistance;Sit to/from stand   Toilet Transfer: Minimal assistance;+2 for safety/equipment (eva walker)   Toileting- Clothing Manipulation and Hygiene: Total assistance (foley)       Functional mobility during ADLs: Minimal assistance;+2 for safety/equipment (Harmon Pierwalker) General ADL Comments: unable to complete figure four positining at this time     Vision   Additional Comments: states "I need glasses"     Perception     Praxis      Pertinent Vitals/Pain  Pain Assessment Pain Assessment: Faces Faces Pain Scale: Hurts even more Pain Location: incision; BLE Pain Descriptors / Indicators: Discomfort, Grimacing, Guarding, Burning Pain Intervention(s): Limited activity within patient's tolerance     Hand Dominance Right   Extremity/Trunk Assessment Upper Extremity Assessment Upper Extremity Assessment: Overall WFL  for tasks assessed   Lower Extremity Assessment Lower Extremity Assessment: Defer to PT evaluation   Cervical / Trunk Assessment Cervical / Trunk Assessment: Normal;Other exceptions   Communication Communication Communication: No difficulties   Cognition Arousal/Alertness: Awake/alert Behavior During Therapy: WFL for tasks assessed/performed (frustrated easily) Overall Cognitive Status: Within Functional Limits for tasks assessed                                       General Comments       Exercises     Shoulder Instructions      Home Living Family/patient expects to be discharged to:: Private residence Living Arrangements: Spouse/significant other Available Help at Discharge: Family;Available 24 hours/day Type of Home: Mobile home Home Access: Stairs to enter Entrance Stairs-Number of Steps: 4 Entrance Stairs-Rails: Right;Left;Can reach both Home Layout: One level     Bathroom Shower/Tub: Occupational psychologist: Standard Bathroom Accessibility: Yes How Accessible: Accessible via walker Home Equipment: Rolling Walker (2 wheels);BSC/3in1;Wheelchair - manual;Grab bars - tub/shower;Shower seat   Additional Comments: Truck driver/dump truck      Prior Functioning/Environment Prior Level of Function : Independent/Modified Independent             Mobility Comments: LEs numb but he could walk          OT Problem List: Decreased strength;Decreased range of motion;Decreased activity tolerance;Decreased safety awareness;Decreased knowledge of use of DME or AE;Pain      OT Treatment/Interventions: Self-care/ADL training;Therapeutic exercise;Energy conservation;DME and/or AE instruction;Therapeutic activities;Patient/family education    OT Goals(Current goals can be found in the care plan section) Acute Rehab OT Goals Patient Stated Goal: to get better, not have pain adn go home OT Goal Formulation: With patient Time For Goal  Achievement: 05/07/22 Potential to Achieve Goals: Good  OT Frequency: Min 2X/week    Co-evaluation              AM-PAC OT "6 Clicks" Daily Activity     Outcome Measure Help from another person eating meals?: Total (NPO) Help from another person taking care of personal grooming?: A Little Help from another person toileting, which includes using toliet, bedpan, or urinal?: Total (foley) Help from another person bathing (including washing, rinsing, drying)?: A Lot Help from another person to put on and taking off regular upper body clothing?: A Lot Help from another person to put on and taking off regular lower body clothing?: A Lot 6 Click Score: 11   End of Session Equipment Utilized During Treatment: Gait belt;Other (comment);Oxygen Harmon Pier; 3L) Nurse Communication: Mobility status;Other (comment) (pt wanting to leave [facility] off)  Activity Tolerance: Patient tolerated treatment well Patient left: in bed;with call bell/phone within reach;with bed alarm set;with SCD's reapplied  OT Visit Diagnosis: Unsteadiness on feet (R26.81);Other abnormalities of gait and mobility (R26.89);Muscle weakness (generalized) (M62.81);Pain                Time: 3710-6269 OT Time Calculation (min): 29 min Charges:  OT General Charges $OT Visit: 1 Visit OT Evaluation $OT Eval Moderate Complexity: 1 Mod OT Treatments $Self Care/Home Management : 8-22 mins  Maurie Boettcher, OT/L   Acute OT Clinical Specialist Acute Rehabilitation Services Pager [phone] Office [phone]   [facility] 04/23/2022, 3:18 PM

## 2022-04-24 ENCOUNTER — Inpatient Hospital Stay (HOSPITAL_COMMUNITY): Payer: 59

## 2022-04-24 LAB — CBC
HCT: 41.4 % (ref 39.0–52.0)
HCT: 38.2 % — ABNORMAL LOW (ref 39.0–52.0)
Hemoglobin: 14.3 g/dL (ref 13.0–17.0)
Hemoglobin: 12.7 g/dL — ABNORMAL LOW (ref 13.0–17.0)
MCH: 31.4 pg (ref 26.0–34.0)
MCH: 31 pg (ref 26.0–34.0)
MCHC: 34.5 g/dL (ref 30.0–36.0)
MCHC: 33.2 g/dL (ref 30.0–36.0)
MCV: 90.8 fL (ref 80.0–100.0)
MCV: 93.2 fL (ref 80.0–100.0)
Platelets: 139 10*3/uL — ABNORMAL LOW (ref 150–400)
Platelets: 118 10*3/uL — ABNORMAL LOW (ref 150–400)
RBC: 4.56 MIL/uL (ref 4.22–5.81)
RBC: 4.1 MIL/uL — ABNORMAL LOW (ref 4.22–5.81)
RDW: 12.7 % (ref 11.5–15.5)
RDW: 12.7 % (ref 11.5–15.5)
WBC: 18.6 10*3/uL — ABNORMAL HIGH (ref 4.0–10.5)
WBC: 13.5 10*3/uL — ABNORMAL HIGH (ref 4.0–10.5)
nRBC: 0 % (ref 0.0–0.2)
nRBC: 0 % (ref 0.0–0.2)

## 2022-04-24 LAB — BASIC METABOLIC PANEL
Anion gap: 9 (ref 5–15)
BUN: 9 mg/dL (ref 6–20)
CO2: 22 mmol/L (ref 22–32)
Calcium: 8.6 mg/dL — ABNORMAL LOW (ref 8.9–10.3)
Chloride: 107 mmol/L (ref 98–111)
Creatinine, Ser: 0.97 mg/dL (ref 0.61–1.24)
GFR, Estimated: >60 mL/min (ref >60)
Glucose, Bld: 177 mg/dL — ABNORMAL HIGH (ref 70–99)
Potassium: 3.8 mmol/L (ref 3.5–5.1)
Sodium: 138 mmol/L (ref 135–145)

## 2022-04-24 LAB — GLUCOSE, CAPILLARY
Glucose-Capillary: 128 mg/dL — ABNORMAL HIGH (ref 70–99)
Glucose-Capillary: 153 mg/dL — ABNORMAL HIGH (ref 70–99)
Glucose-Capillary: 106 mg/dL — ABNORMAL HIGH (ref 70–99)
Glucose-Capillary: 101 mg/dL — ABNORMAL HIGH (ref 70–99)

## 2022-04-24 LAB — FIBRINOGEN
Fibrinogen: 581 mg/dL — ABNORMAL HIGH (ref 210–475)
Fibrinogen: >800 mg/dL — ABNORMAL HIGH (ref 210–475)
Fibrinogen: 734 mg/dL — ABNORMAL HIGH (ref 210–475)
Fibrinogen: 741 mg/dL — ABNORMAL HIGH (ref 210–475)

## 2022-04-24 MED ORDER — HEPARIN (PORCINE) 25000 UT/250ML-% IV SOLN: 5.0000 [IU]/kg/h | INTRAVENOUS | Status: DC

## 2022-04-24 MED ORDER — HEPARIN (PORCINE) 25000 UT/250ML-% IV SOLN
500.0000 [IU]/h | INTRAVENOUS | Status: DC
  Administered 2022-04-24 – 2022-04-26 (×2): 500 [IU]/h via INTRAVENOUS
  Filled 2022-04-24 (×2): qty 250

## 2022-04-24 MED FILL — Sodium Chloride Irrigation Soln 0.9%: Qty: 3000 | Status: AC

## 2022-04-24 MED FILL — Heparin Sodium (Porcine) Inj 1000 Unit/ML: INTRAMUSCULAR | Qty: 30 | Status: AC

## 2022-04-24 MED FILL — Sodium Chloride IV Soln 0.9%: INTRAVENOUS | Qty: 1000 | Status: AC

## 2022-04-24 NOTE — TOC Initial Note (Signed)
Transition of Care [facility]) - Initial/Assessment Note    Patient Details  Name: Bryan Hull MRN: [id number] Date of Birth: Apr 18, 1976  Transition of Care [facility]) CM/SW Contact:    Bethena Roys, RN Phone Number: 04/24/2022, 12:38 PM  Clinical Narrative:  Patient presented for  left lower extremity critical limb ischemia with rest pain. S/p end aortobifemoral bypass. PTA patient was from home with spouse. Patient states spouse works at night and his mother will stay with him while spouse is at work. Case Manager discussed home health PT/OT and patient wants more time to decide to see if he needs services and how he progresses. Case Manager will continue to follow for additional transition of care needs.            Expected Discharge Plan: Home/Self Care Barriers to Discharge: Continued Medical Work up   Patient Goals and CMS Choice Patient states their goals for this hospitalization and ongoing recovery are:: patients goal is to return home.   Choice offered to / list presented to : NA  Expected Discharge Plan and Services Expected Discharge Plan: Home/Self Care In-house Referral: NA Discharge Planning Services: CM Consult Post Acute Care Choice: NA Living arrangements for the past 2 months: Single Family Home                 DME Arranged: N/A DME Agency: NA       HH Arranged: NA     Prior Living Arrangements/Services Living arrangements for the past 2 months: Single Family Home Lives with:: Spouse Patient language and need for interpreter reviewed:: Yes Do you feel safe going back to the place where you live?: Yes      Need for Family Participation in Patient Care: Yes (Comment) Care giver support system in place?: Yes (comment)   Criminal Activity/Legal Involvement Pertinent to Current Situation/Hospitalization: No - Comment as needed  Activities of Daily Living Home Assistive Devices/Equipment: None ADL Screening (condition at time of admission) Patient's  cognitive ability adequate to safely complete daily activities?: Yes Is the patient deaf or have difficulty hearing?: No Does the patient have difficulty concentrating, remembering, or making decisions?: No Patient able to express need for assistance with ADLs?: Yes Does the patient have difficulty dressing or bathing?: No Independently performs ADLs?: Yes (appropriate for developmental age) Does the patient have difficulty walking or climbing stairs?: Yes (yes d/t leg numbness) Weakness of Legs: Left Weakness of Arms/Hands: None  Permission Sought/Granted Permission sought to share information with : Family Supports, Case Manager   Emotional Assessment Appearance:: Appears stated age Attitude/Demeanor/Rapport: Engaged Affect (typically observed): Appropriate Orientation: : Oriented to Situation, Oriented to  Time, Oriented to Place, Oriented to Self Alcohol / Substance Use: Not Applicable Psych Involvement: No (comment)  Admission diagnosis:  Claudication in peripheral vascular disease ([place]) [I73.9] Aortoiliac occlusive disease ([place]) [I74.09] Patient Active Problem List   Diagnosis Date Noted   Claudication in peripheral vascular disease ([place]) 04/22/2022   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

## 2022-04-24 NOTE — Progress Notes (Signed)
Physical Therapy Treatment Patient Details Name: Bryan Hull MRN: [id number] DOB: 1975-08-28 Today's Date: 04/24/2022   History of Present Illness Pt is a 46 y.o. male admitted 04/22/22 with 2-monthh/o short distance LLE claudication. 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 S/p end aortobifemoral bypass graft, inferior mesenteric vein ligation, R common femoral endarterectomy 9/25. PMH includes asthma, DM, PAD, tobacco use.   PT Comments    Pt progressing with mobility, though frustrated by NGT and expectation to move more despite pain. Today's session focused on transfer and gait training; pt moving well, adamantly declines ambulation attempt without DME due to pain. Overall, pt progressing well with mobility; reviewed education and activity recommendations. Will continue to follow acutely to address established goals.   SpO2 88-95% on RA HR up to 126 with activity    Recommendations for follow up therapy are one component of a multi-disciplinary discharge planning process, led by the attending physician.  Recommendations may be updated based on patient status, additional functional criteria and insurance authorization.  Follow Up Recommendations  Home health PT     Assistance Recommended at Discharge Intermittent Supervision/Assistance  Patient can return home with the following A little help with bathing/dressing/bathroom;Assistance with cooking/housework;Assist for transportation;Help with stairs or ramp for entrance   Equipment Recommendations  None recommended by PT    Recommendations for Other Services       Precautions / Restrictions Precautions Precautions: Fall;Other (comment) Precaution Comments: Watch SpO2 (does not wear baseline), HR; NGT Restrictions Weight Bearing Restrictions: No     Mobility  Bed Mobility Overal bed mobility: Modified Independent Bed Mobility: Supine to Sit           General bed mobility  comments: use of bed rail for modified log roll; no physical assist needed    Transfers Overall transfer level: Needs assistance Equipment used:  (eva walker) Transfers: Sit to/from Stand Sit to Stand: Supervision           General transfer comment: pt wanting brakes locked prior to standing "so I won't fall"; perform standing with eva walker for comfort, though not needed to complete transfer; supervision for safety/lines    Ambulation/Gait Ambulation/Gait assistance: Supervision Gait Distance (Feet): 360 Feet Assistive device: EEthelene HalGait Pattern/deviations: Step-through pattern, Decreased stride length, Wide base of support, Antalgic Gait velocity: Decreased     General Gait Details: slow, guarded gait with eva walker and supervision for safety/lines; intermittent brief standing rest breaks with c/o pain and fatigue; pt requires encouragement to increase ambulation distance   Stairs             Wheelchair Mobility    Modified Rankin (Stroke Patients Only)       Balance Overall balance assessment: Needs assistance Sitting-balance support: No upper extremity supported, Feet supported Sitting balance-Leahy Scale: Good     Standing balance support: During functional activity, Bilateral upper extremity supported, No upper extremity supported Standing balance-Leahy Scale: Fair Standing balance comment: can static stand without UE support, preference for walker                            Cognition Arousal/Alertness: Awake/alert Behavior During Therapy: WFL for tasks assessed/performed (easily frustrated) Overall Cognitive Status: Within Functional Limits for tasks assessed  General Comments: WFL for simple tasks, though easily frustrated and reports, "No one listens to me, you just do what you want to do" - attempted education on reasoning for decisions (i.e. trying to walk on RA since saturations  stable, etc.)        Exercises      General Comments General comments (skin integrity, edema, etc.): pt's mom present. HR up to 126 with activity; SpO2 92-95% on RA with walk, maintaining 88% with seated rest in room therefore 2L O2  replaced. educ on importance of mobility, activity recommendations and plan to start working away from DME as able with BLE pain      Pertinent Vitals/Pain Pain Assessment Pain Assessment: Faces Faces Pain Scale: Hurts even more Pain Location: incisions, abdomen, BLEs Pain Descriptors / Indicators: Discomfort, Grimacing, Guarding, Burning Pain Intervention(s): Monitored during session, Premedicated before session    Home Living                          Prior Function            PT Goals (current goals can now be found in the care plan section) Progress towards PT goals: Progressing toward goals    Frequency    Min 3X/week      PT Plan Frequency needs to be updated    Co-evaluation              AM-PAC PT "6 Clicks" Mobility   Outcome Measure  Help needed turning from your back to your side while in a flat bed without using bedrails?: None Help needed moving from lying on your back to sitting on the side of a flat bed without using bedrails?: A Little Help needed moving to and from a bed to a chair (including a wheelchair)?: A Little Help needed standing up from a chair using your arms (e.g., wheelchair or bedside chair)?: A Little Help needed to walk in hospital room?: A Little Help needed climbing 3-5 steps with a railing? : A Little 6 Click Score: 19    End of Session Equipment Utilized During Treatment: Gait belt Activity Tolerance: Patient tolerated treatment well;Patient limited by pain Patient left: in chair;with call bell/phone within reach Nurse Communication: Mobility status PT Visit Diagnosis: Unsteadiness on feet (R26.81);Muscle weakness (generalized) (M62.81)     Time: 9242-6834 PT Time  Calculation (min) (ACUTE ONLY): 35 min  Charges:  $Gait Training: 8-22 mins $Therapeutic Activity: 8-22 mins                      Mabeline Caras, PT, DPT Acute Rehabilitation Services  Personal: [facility] Office: [place] 04/24/2022, 9:49 AM

## 2022-04-24 NOTE — Progress Notes (Addendum)
  Progress Note    04/24/2022 8:00 AM 2 Days Post-Op  Subjective:  pain overall manageable says Increased with PT and also when he coughs. Getting a little aggravated with NG tube and wants it out   Vitals:   04/24/22 0600 04/24/22 0700  BP: (!) 121/92 135/84  Pulse: 82 84  Resp: 18 18  Temp: 98.5 F (36.9 C)   SpO2: 95% 96%   Physical Exam: Cardiac:  regular Lungs:  non labored Incisions:  B groin incisions and laparotomy incision is clean,dry and intact. Well appearing Extremities:  bilateral lower extremities are well perfused and warm with palpable Dp pulses bilaterally Abdomen:  soft, non distended, expected tenderness Neurologic: alert and oriented  CBC    Component Value Date/Time   WBC 15.7 (H) 04/23/2022 1421   RBC 4.56 04/23/2022 1421   HGB 14.5 04/23/2022 1421   HCT 40.6 04/23/2022 1421   PLT 142 (L) 04/23/2022 1421   MCV 89.0 04/23/2022 1421   MCH 31.8 04/23/2022 1421   MCHC 35.7 04/23/2022 1421   RDW 12.8 04/23/2022 1421   LYMPHSABS 3.5 03/14/2022 0851   MONOABS 0.9 03/14/2022 0851   EOSABS 0.3 03/14/2022 0851   BASOSABS 0.1 03/14/2022 0851    BMET    Component Value Date/Time   NA 140 04/23/2022 1421   K 3.8 04/23/2022 1421   CL 108 04/23/2022 1421   CO2 25 04/23/2022 1421   GLUCOSE 126 (H) 04/23/2022 1421   BUN 10 04/23/2022 1421   CREATININE 0.83 04/23/2022 1421   CALCIUM 8.5 (L) 04/23/2022 1421   GFRNONAA >60 04/23/2022 1421    INR    Component Value Date/Time   INR 1.2 04/23/2022 1421     Intake/Output Summary (Last 24 hours) at 04/24/2022 0800 Last data filed at 04/24/2022 0700 Gross per 24 hour  Intake 2362.13 ml  Output 2475 ml  Net -112.87 ml     Assessment/Plan:  46 y.o. male is s/p Aortobifemoral bypass, IMA vein ligation, R CF endarterectomy  2 Days Post-Op   Progressing well Pain overall well controlled Incisions are intact and well appearing Lower extremities are well perfused and warm with palpable DP  bilaterally VSS Not passing any Flatus, no BM Continue NPO Okay for ice chips Will defer to Dr. Virl Cagey about d/c NGT Pending morning labs Continue Therapies and OOB x 3 daily   Corrina Baglia, PA-C [facility] [phone] 04/24/2022 8:00 AM  Patient seen and examined this morning, followup exam this afternoon He is progressing appropriately, high NG tube output over the first 24 hours.  KUB demonstrated it could be in the duodenum, therefore the tube was pulled back 10 cm Not passing gas, no bowel movement, Abdomen soft, bilateral groin sites soft, palpable pulses in the feet We will keep patient in ICU tonight with plans for floor tomorrow Daily labs Appreciate ICU level care, physical and Occupational Therapy Please quantify ice chips in I's and O's follow NG tube output.  [name] MD

## 2022-04-25 LAB — GLUCOSE, CAPILLARY
Glucose-Capillary: 99 mg/dL (ref 70–99)
Glucose-Capillary: 135 mg/dL — ABNORMAL HIGH (ref 70–99)
Glucose-Capillary: 108 mg/dL — ABNORMAL HIGH (ref 70–99)
Glucose-Capillary: 96 mg/dL (ref 70–99)

## 2022-04-25 LAB — BASIC METABOLIC PANEL
Anion gap: 10 (ref 5–15)
BUN: 8 mg/dL (ref 6–20)
CO2: 23 mmol/L (ref 22–32)
Calcium: 8.2 mg/dL — ABNORMAL LOW (ref 8.9–10.3)
Chloride: 106 mmol/L (ref 98–111)
Creatinine, Ser: 0.79 mg/dL (ref 0.61–1.24)
GFR, Estimated: >60 mL/min (ref >60)
Glucose, Bld: 107 mg/dL — ABNORMAL HIGH (ref 70–99)
Potassium: 3.7 mmol/L (ref 3.5–5.1)
Sodium: 139 mmol/L (ref 135–145)

## 2022-04-25 LAB — FIBRINOGEN
Fibrinogen: 707 mg/dL — ABNORMAL HIGH (ref 210–475)
Fibrinogen: 706 mg/dL — ABNORMAL HIGH (ref 210–475)
Fibrinogen: 758 mg/dL — ABNORMAL HIGH (ref 210–475)

## 2022-04-25 LAB — CBC
HCT: 35 % — ABNORMAL LOW (ref 39.0–52.0)
Hemoglobin: 11.9 g/dL — ABNORMAL LOW (ref 13.0–17.0)
MCH: 31.2 pg (ref 26.0–34.0)
MCHC: 34 g/dL (ref 30.0–36.0)
MCV: 91.6 fL (ref 80.0–100.0)
Platelets: 105 10*3/uL — ABNORMAL LOW (ref 150–400)
RBC: 3.82 MIL/uL — ABNORMAL LOW (ref 4.22–5.81)
RDW: 12.6 % (ref 11.5–15.5)
WBC: 10.3 10*3/uL (ref 4.0–10.5)
nRBC: 0 % (ref 0.0–0.2)

## 2022-04-25 LAB — HEPARIN LEVEL (UNFRACTIONATED): Heparin Unfractionated: <0.1 IU/mL — ABNORMAL LOW (ref 0.30–0.70)

## 2022-04-25 MED ORDER — ORAL CARE MOUTH RINSE: 15.0000 mL | OROMUCOSAL | Status: DC | PRN

## 2022-04-25 MED ORDER — MELATONIN 3 MG PO TABS
3.0000 mg | ORAL_TABLET | Freq: Every day | ORAL | Status: DC
  Administered 2022-04-25 – 2022-04-26 (×2): 3 mg via ORAL
  Filled 2022-04-25 (×2): qty 1

## 2022-04-25 NOTE — Progress Notes (Signed)
Occupational Therapy Treatment Patient Details Name: Bryan Hull MRN: [id number] DOB: [birth year] Today's Date: 04/25/2022   History of present illness Pt is a 46 y.o. male admitted 04/22/22 with 46-monthh/o short distance LLE claudication. 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 S/p end aortobifemoral bypass graft, inferior mesenteric vein ligation, R common femoral endarterectomy 9/25. PMH includes asthma, DM, PAD, tobacco use.   OT comments  Making steady progress. Ambulated 370 ft using Eva walker for support per pt request on RA with VSS. Able to complete figure four positioning that he will use when completing ADL tasks. Acute OT to follow hoever will not need OT follow up.    Recommendations for follow up therapy are one component of a multi-disciplinary discharge planning process, led by the attending physician.  Recommendations may be updated based on patient status, additional functional criteria and insurance authorization.    Follow Up Recommendations  No OT follow up    Assistance Recommended at Discharge Intermittent Supervision/Assistance  Patient can return home with the following  A little help with walking and/or transfers;A little help with bathing/dressing/bathroom;Assistance with cooking/housework;Assist for transportation;Help with stairs or ramp for entrance   Equipment Recommendations  None recommended by OT    Recommendations for Other Services      Precautions / Restrictions Precautions Precaution Comments: NG       Mobility Bed Mobility Overal bed mobility: Needs Assistance Bed Mobility: Supine to Sit     Supine to sit: HOB elevated, Supervision          Transfers Overall transfer level: Needs assistance   Transfers: Sit to/from Stand Sit to Stand: Supervision           General transfer comment: EHarmon Pierwalker used per pt request although not needed for support     Balance     Sitting  balance-Leahy Scale: Good       Standing balance-Leahy Scale: Fair                             ADL either performed or assessed with clinical judgement   ADL       Grooming: Set up;Supervision/safety   Upper Body Bathing: Supervision/ safety;Set up;Sitting           Lower Body Dressing: Minimal assistance;Sit to/from stand       Toileting- CWater quality scientistand Hygiene:  (Pt expressing need to hae a BM however when offered to walk into the bathroom pt declined [facility]aing [facility] stated he would just return to bed)       Functional mobility during ADLs: Supervision/safety (EVa walker) General ADL Comments: Able to comlete figure four positioning this date; encouraged pt to work on bathing however he states that if he can bath himself "he should just go home"    Extremity/Trunk Assessment              Vision       Perception     Praxis      Cognition Arousal/Alertness: Awake/alert Behavior During Therapy: WFL for tasks assessed/performed Overall Cognitive Status: Within Functional Limits for tasks assessed                                          Exercises  Shoulder Instructions       General Comments VSS on RA with Max HR 121    Pertinent Vitals/ Pain       Pain Assessment Pain Assessment: 0-10 Pain Score: 4  Pain Location: abdomen Pain Descriptors / Indicators: Discomfort, Grimacing Pain Intervention(s): Premedicated before session, Limited activity within patient's tolerance  Home Living                                          Prior Functioning/Environment              Frequency  Min 2X/week        Progress Toward Goals  OT Goals(current goals can now be found in the care plan section)  Progress towards OT goals: Progressing toward goals  Acute Rehab OT Goals Patient Stated Goal: to get better OT Goal Formulation: With  patient Time For Goal Achievement: 05/07/22 Potential to Achieve Goals: Good ADL Goals Pt Will Perform Lower Body Bathing: with modified independence;sit to/from stand Pt Will Perform Lower Body Dressing: with modified independence;sit to/from stand Pt Will Transfer to Toilet: with modified independence;ambulating Pt Will Perform Toileting - Clothing Manipulation and hygiene: with modified independence;sit to/from stand  Plan Discharge plan remains appropriate    Co-evaluation                 AM-PAC OT "6 Clicks" Daily Activity     Outcome Measure   Help from another person eating meals?: Total (NPO) Help from another person taking care of personal grooming?: A Little Help from another person toileting, which includes using toliet, bedpan, or urinal?: A Little Help from another person bathing (including washing, rinsing, drying)?: A Lot Help from another person to put on and taking off regular upper body clothing?: A Little Help from another person to put on and taking off regular lower body clothing?: A Lot 6 Click Score: 14    End of Session Equipment Utilized During Treatment: Other (comment) Harmon Pier walker)  OT Visit Diagnosis: Unsteadiness on feet (R26.81);Other abnormalities of gait and mobility (R26.89);Muscle weakness (generalized) (M62.81);Pain Pain - part of body:  (abdomen)   Activity Tolerance Patient tolerated treatment well   Patient Left in bed (chair positino; pt declined to sit in the recliner due to being uncomfortable)   Nurse Communication Mobility status        Time: 8841-6606 OT Time Calculation (min): 40 min  Charges: OT General Charges $OT Visit: 1 Visit OT Treatments $Self Care/Home Management : 38-52 mins  Maurie Boettcher, OT/L   Acute OT Clinical Specialist [place] Pager [phone] Office [phone]   [facility] 04/25/2022, 1:05 PM

## 2022-04-25 NOTE — Progress Notes (Addendum)
  Progress Note    04/25/2022 7:18 AM 3 Days Post-Op  Subjective:  frustrated with NGT being pulled out last night and having to be replaced. Did not sleep after that. A lot of pain in incisions as a result of his frustration with everything overnight. Better this morning    Vitals:   04/25/22 0500 04/25/22 0700  BP: 128/86   Pulse: 89   Resp: 13   Temp:  97.8 F (36.6 C)  SpO2: 98%    Physical Exam: Cardiac:  regular Lungs:  non labored Incisions: B groin incisions, laparotomy incision c/d/I without swelling or hematoma Extremities:  well perfused and warm with palpable DP pulses bilaterally Abdomen:  soft, non distended Neurologic: alert and oriented  CBC    Component Value Date/Time   WBC 13.5 (H) 04/24/2022 1944   RBC 4.10 (L) 04/24/2022 1944   HGB 12.7 (L) 04/24/2022 1944   HCT 38.2 (L) 04/24/2022 1944   PLT 118 (L) 04/24/2022 1944   MCV 93.2 04/24/2022 1944   MCH 31.0 04/24/2022 1944   MCHC 33.2 04/24/2022 1944   RDW 12.7 04/24/2022 1944   LYMPHSABS 3.5 03/14/2022 0851   MONOABS 0.9 03/14/2022 0851   EOSABS 0.3 03/14/2022 0851   BASOSABS 0.1 03/14/2022 0851    BMET    Component Value Date/Time   NA 138 04/24/2022 0938   K 3.8 04/24/2022 0938   CL 107 04/24/2022 0938   CO2 22 04/24/2022 0938   GLUCOSE 177 (H) 04/24/2022 0938   BUN 9 04/24/2022 0938   CREATININE 0.97 04/24/2022 0938   CALCIUM 8.6 (L) 04/24/2022 0938   GFRNONAA >60 04/24/2022 0938    INR    Component Value Date/Time   INR 1.2 04/23/2022 1421     Intake/Output Summary (Last 24 hours) at 04/25/2022 0718 Last data filed at 04/25/2022 0700 Gross per 24 hour  Intake 2209.1 ml  Output 1203 ml  Net 1006.1 ml     Assessment/Plan:  46 y.o. male is s/p Aortobifemoral bypass, IMA vein ligation, R CF endarterectomy  3 Days Post-Op   Pain overall well controlled. Work on weaning IV meds NGT output less overnight 30 recorded, 75 in canister however got pulled out last night and had to  be replaced. May be able to d/c later today Passing flatus now, no BM  Remains hemodynamically stable Good UOP Progressing with PT. Recommending HH PT pending progression Continue OOB x 3 daily Plan to transfer to floor today  DVT prophylaxis:  heparin gtt   Karoline Caldwell, PA-C [facility] [phone] 04/25/2022 7:18 AM  VASCULAR STAFF ADDENDUM: I have independently interviewed and examined the patient. I agree with the above.   Overall progressing appropriately. Incisions look great. Palpable pulses in the groin.  Can pull foley, IJ line NGT to stay, can put to gravity NPO, continue fluids PT OT 4E transfer  Cassandria Santee, MD [facility] Phone Number: [phone] 04/25/2022 10:16 AM

## 2022-04-25 NOTE — Progress Notes (Signed)
Patient arrived to 4E from 2 Heart. Vitals taken and stable. Tele placed and CCMD notified. Call bell within reach. Abdomen assessed and clean, dry, intact. Family at the bedside. Bryan Hull

## 2022-04-25 NOTE — Progress Notes (Signed)
Paged on-call physician to request diet order.

## 2022-04-26 ENCOUNTER — Other Ambulatory Visit (HOSPITAL_COMMUNITY): Payer: Self-pay

## 2022-04-26 ENCOUNTER — Telehealth (HOSPITAL_COMMUNITY): Payer: Self-pay

## 2022-04-26 LAB — GLUCOSE, CAPILLARY
Glucose-Capillary: 104 mg/dL — ABNORMAL HIGH (ref 70–99)
Glucose-Capillary: 125 mg/dL — ABNORMAL HIGH (ref 70–99)
Glucose-Capillary: 121 mg/dL — ABNORMAL HIGH (ref 70–99)
Glucose-Capillary: 124 mg/dL — ABNORMAL HIGH (ref 70–99)

## 2022-04-26 LAB — BASIC METABOLIC PANEL
Anion gap: 5 (ref 5–15)
BUN: 8 mg/dL (ref 6–20)
CO2: 25 mmol/L (ref 22–32)
Calcium: 8.1 mg/dL — ABNORMAL LOW (ref 8.9–10.3)
Chloride: 109 mmol/L (ref 98–111)
Creatinine, Ser: 0.79 mg/dL (ref 0.61–1.24)
GFR, Estimated: >60 mL/min (ref >60)
Glucose, Bld: 99 mg/dL (ref 70–99)
Potassium: 3.4 mmol/L — ABNORMAL LOW (ref 3.5–5.1)
Sodium: 139 mmol/L (ref 135–145)

## 2022-04-26 LAB — CBC
HCT: 33.4 % — ABNORMAL LOW (ref 39.0–52.0)
Hemoglobin: 11.3 g/dL — ABNORMAL LOW (ref 13.0–17.0)
MCH: 30.7 pg (ref 26.0–34.0)
MCHC: 33.8 g/dL (ref 30.0–36.0)
MCV: 90.8 fL (ref 80.0–100.0)
Platelets: 133 10*3/uL — ABNORMAL LOW (ref 150–400)
RBC: 3.68 MIL/uL — ABNORMAL LOW (ref 4.22–5.81)
RDW: 12.5 % (ref 11.5–15.5)
WBC: 9.4 10*3/uL (ref 4.0–10.5)
nRBC: 0 % (ref 0.0–0.2)

## 2022-04-26 LAB — HEPARIN LEVEL (UNFRACTIONATED): Heparin Unfractionated: <0.1 IU/mL — ABNORMAL LOW (ref 0.30–0.70)

## 2022-04-26 LAB — FIBRINOGEN: Fibrinogen: 704 mg/dL — ABNORMAL HIGH (ref 210–475)

## 2022-04-26 MED ORDER — DOCUSATE SODIUM 100 MG PO CAPS
100.0000 mg | ORAL_CAPSULE | Freq: Every day | ORAL | Status: DC
  Administered 2022-04-26 – 2022-04-27 (×2): 100 mg via ORAL
  Filled 2022-04-26 (×2): qty 1

## 2022-04-26 MED ORDER — APIXABAN 2.5 MG PO TABS
2.5000 mg | ORAL_TABLET | Freq: Two times a day (BID) | ORAL | 0 refills | Status: DC
  Filled 2022-04-26: qty 60, 30d supply, fill #0

## 2022-04-26 MED ORDER — MORPHINE SULFATE (PF) 2 MG/ML IV SOLN: 4.0000 mg | INTRAVENOUS | Status: DC | PRN

## 2022-04-26 MED ORDER — POTASSIUM CHLORIDE CRYS ER 20 MEQ PO TBCR
40.0000 meq | EXTENDED_RELEASE_TABLET | Freq: Once | ORAL | Status: AC
  Administered 2022-04-26: 40 meq via ORAL

## 2022-04-26 NOTE — Telephone Encounter (Signed)
Pharmacy Patient Advocate Encounter  Insurance verification completed.    The patient is insured through [facility]   The patient is currently admitted and ran test claims for the following: Eliquis Starter pack.  Copays and coinsurance results were relayed to Inpatient clinical team.

## 2022-04-26 NOTE — Progress Notes (Signed)
  Progress Note    04/26/2022 8:16 AM 4 Days Post-Op  Subjective:  BM this morning, feels much better than previous   Vitals:   04/26/22 0503 04/26/22 0741  BP: 106/80 (!) 148/99  Pulse: 75 81  Resp: 16 16  Temp: 98.7 F (37.1 C) 98.3 F (36.8 C)  SpO2: 95% 96%   Physical Exam: Lungs:  non labored Abdomen:  soft, non distended Neurologic: alert and oriented  CBC    Component Value Date/Time   WBC 9.4 04/26/2022 0056   RBC 3.68 (L) 04/26/2022 0056   HGB 11.3 (L) 04/26/2022 0056   HCT 33.4 (L) 04/26/2022 0056   PLT 133 (L) 04/26/2022 0056   MCV 90.8 04/26/2022 0056   MCH 30.7 04/26/2022 0056   MCHC 33.8 04/26/2022 0056   RDW 12.5 04/26/2022 0056   LYMPHSABS 3.5 03/14/2022 0851   MONOABS 0.9 03/14/2022 0851   EOSABS 0.3 03/14/2022 0851   BASOSABS 0.1 03/14/2022 0851    BMET    Component Value Date/Time   NA 139 04/26/2022 0056   K 3.4 (L) 04/26/2022 0056   CL 109 04/26/2022 0056   CO2 25 04/26/2022 0056   GLUCOSE 99 04/26/2022 0056   BUN 8 04/26/2022 0056   CREATININE 0.79 04/26/2022 0056   CALCIUM 8.1 (L) 04/26/2022 0056   GFRNONAA >60 04/26/2022 0056    INR    Component Value Date/Time   INR 1.2 04/23/2022 1421     Intake/Output Summary (Last 24 hours) at 04/26/2022 0816 Last data filed at 04/25/2022 1800 Gross per 24 hour  Intake 1204.66 ml  Output 300 ml  Net 904.66 ml      Assessment/Plan:  46 y.o. 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   Cassandria Santee, MD [facility] Phone Number: [phone] 04/26/2022 8:16 AM

## 2022-04-26 NOTE — TOC Benefit Eligibility Note (Signed)
Patient Teacher, English as a foreign language completed.    The current 30 day co-pay is, $45.00 for Xarelto '10mg'$  and $45.00 for Eliquis 2.'5mg'$ .   The patient is insured through [facility] (accepted at [facility])   Otis Brace, [place] Patient Advocate Specialist [place] Patient Advocate Team Direct Number: [phone]  Fax: [phone]

## 2022-04-26 NOTE — Progress Notes (Signed)
On-call physician called x 2, however no call back. Will attempt to secure diet order in the morning.

## 2022-04-26 NOTE — Discharge Instructions (Signed)
Information on my medicine - ELIQUIS (apixaban)  Why was Eliquis prescribed for you? Eliquis was prescribed for you to reduce the risk of blood clots forming after surgery.    What do You need to know about Eliquis? Take your Eliquis TWICE DAILY - one tablet in the morning and one tablet in the evening with or without food.  It would be best to take the dose about the same time each day.  If you have difficulty swallowing the tablet whole please discuss with your pharmacist how to take the medication safely.  Take Eliquis exactly as prescribed by your doctor and DO NOT stop taking Eliquis without talking to the doctor who prescribed the medication.  Stopping without other medication to take the place of Eliquis may increase your risk of developing a clot.  After discharge, you should have regular check-up appointments with your healthcare provider that is prescribing your Eliquis.  What do you do if you miss a dose? If a dose of ELIQUIS is not taken at the scheduled time, take it as soon as possible on the same day and twice-daily administration should be resumed.  The dose should not be doubled to make up for a missed dose.  Do not take more than one tablet of ELIQUIS at the same time.  Important Safety Information A possible side effect of Eliquis is bleeding. You should call your healthcare provider right away if you experience any of the following: Bleeding from an injury or your nose that does not stop. Unusual colored urine (red or dark brown) or unusual colored stools (red or black). Unusual bruising for unknown reasons. A serious fall or if you hit your head (even if there is no bleeding).  Some medicines may interact with Eliquis and might increase your risk of bleeding or clotting while on Eliquis. To help avoid this, consult your healthcare provider or pharmacist prior to using any new prescription or non-prescription medications, including herbals, vitamins,  non-steroidal anti-inflammatory drugs (NSAIDs) and supplements.  This website has more information on Eliquis (apixaban): http://www.eliquis.com/eliquis/home

## 2022-04-26 NOTE — TOC Transition Note (Signed)
One (1) discharge med stored in the inpatient pharmacy until discharge. 

## 2022-04-26 NOTE — Telephone Encounter (Signed)
Pharmacy Patient Advocate Encounter  Insurance verification completed.    The patient is insured through [facility]   The patient is currently admitted and ran test claims for the following: Eliquis 2.'5mg'$  and Xarelto '10mg'$ .  Copays and coinsurance results were relayed to Inpatient clinical team.

## 2022-04-26 NOTE — Progress Notes (Signed)
Physical Therapy Treatment Patient Details Name: Bryan Hull MRN: [id number] DOB: 09/06/1975 Today's Date: 04/26/2022   History of Present Illness Pt is a 46 y.o. male admitted 04/22/22 with 72-monthh/o short distance LLE claudication. 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 S/p end aortobifemoral bypass graft, inferior mesenteric vein ligation, R common femoral endarterectomy 9/25. PMH includes asthma, DM, PAD, tobacco use.    PT Comments    Patient making excellent progress with mobility and ambulate ~500' with IV pole for support. HR stable in 100-115 bpm during activity, 3 stop breaks during gait. No LOB noted throughout though step width remains slightly wide. EOS reviewed functional sit<>stands for LE strengthening however pt declined to perform, verbalized understanding of benefits and technique. Encouraged pt to continue mobilizing as able throughout the day. Will continue to progress during stay.    Recommendations for follow up therapy are one component of a multi-disciplinary discharge planning process, led by the attending physician.  Recommendations may be updated based on patient status, additional functional criteria and insurance authorization.  Follow Up Recommendations  Home health PT     Assistance Recommended at Discharge Intermittent Supervision/Assistance  Patient can return home with the following A little help with walking and/or transfers;A little help with bathing/dressing/bathroom;Assistance with cooking/housework;Direct supervision/assist for medications management;Help with stairs or ramp for entrance;Assist for transportation   Equipment Recommendations  None recommended by PT    Recommendations for Other Services       Precautions / Restrictions Precautions Precautions: Fall;Other (comment) Restrictions Weight Bearing Restrictions: No     Mobility  Bed Mobility Overal bed mobility: Modified  Independent Bed Mobility: Supine to Sit, Sit to Supine     Supine to sit: Modified independent (Device/Increase time), HOB elevated Sit to supine: Modified independent (Device/Increase time), HOB elevated   General bed mobility comments: some extra time, no assist or cues needed.    Transfers Overall transfer level: Needs assistance Equipment used: None Transfers: Sit to/from Stand Sit to Stand: Modified independent (Device/Increase time)           General transfer comment: pt using single UE to power up from EOB, no assist or cues, steady on rise and good control to lower    Ambulation/Gait Ambulation/Gait assistance: Supervision Gait Distance (Feet): 500 Feet Assistive device: IV Pole Gait Pattern/deviations: WFL(Within Functional Limits), Wide base of support, Drifts right/left Gait velocity: decr     General Gait Details: overall gait WFL's, and no overt LOB noted. pt occasionally drifting Rt/Lt and BOS wide due to LE incisional pain. VC for hand placement on IV pole.   Stairs             Wheelchair Mobility    Modified Rankin (Stroke Patients Only)       Balance Overall balance assessment: Needs assistance Sitting-balance support: No upper extremity supported, Feet supported Sitting balance-Leahy Scale: Good     Standing balance support: During functional activity, Single extremity supported Standing balance-Leahy Scale: Good Standing balance comment: pt prefers IV pole for support but not neccessary                            Cognition Arousal/Alertness: Awake/alert Behavior During Therapy: WFL for tasks assessed/performed Overall Cognitive Status: Within Functional Limits for tasks assessed  Exercises Other Exercises Other Exercises: attempted to educate on repeat Sit<>Stands; pt declined to participate at EOS but verbalized understanding. 3 family members in room verbalize  plan to complete with him later today.    General Comments        Pertinent Vitals/Pain Pain Assessment Pain Assessment: Faces Pain Location: abdomen and LE    Home Living                          Prior Function            PT Goals (current goals can now be found in the care plan section) Acute Rehab PT Goals Patient Stated Goal: to go home PT Goal Formulation: With patient Time For Goal Achievement: 05/07/22 Potential to Achieve Goals: Good Progress towards PT goals: Progressing toward goals    Frequency    Min 3X/week      PT Plan Current plan remains appropriate    Co-evaluation              AM-PAC PT "6 Clicks" Mobility   Outcome Measure  Help needed turning from your back to your side while in a flat bed without using bedrails?: None Help needed moving from lying on your back to sitting on the side of a flat bed without using bedrails?: None Help needed moving to and from a bed to a chair (including a wheelchair)?: None Help needed standing up from a chair using your arms (e.g., wheelchair or bedside chair)?: None Help needed to walk in hospital room?: A Little Help needed climbing 3-5 steps with a railing? : A Little 6 Click Score: 22    End of Session Equipment Utilized During Treatment: Gait belt Activity Tolerance: Patient tolerated treatment well;Patient limited by pain Patient left: in bed;with call bell/phone within reach;with family/visitor present Nurse Communication: Mobility status PT Visit Diagnosis: Unsteadiness on feet (R26.81);Muscle weakness (generalized) (M62.81)     Time: 2979-8921 PT Time Calculation (min) (ACUTE ONLY): 22 min  Charges:  $Therapeutic Exercise: 8-22 mins                     Verner Mould, DPT Acute Rehabilitation Services Office [phone]  04/26/22 12:01 PM

## 2022-04-26 NOTE — TOC Benefit Eligibility Note (Signed)
Patient Teacher, English as a foreign language completed.    The current 30 day co-pay is, $45.00 for Xarelto Starter Pack.   The patient is insured through [employer] (only accepted at [place])   Otis Brace, [place] Patient Advocate Specialist [place] Patient Advocate Team Direct Number: [phone]  Fax: [phone]

## 2022-04-27 LAB — BASIC METABOLIC PANEL
Anion gap: 7 (ref 5–15)
BUN: 7 mg/dL (ref 6–20)
CO2: 25 mmol/L (ref 22–32)
Calcium: 8.1 mg/dL — ABNORMAL LOW (ref 8.9–10.3)
Chloride: 107 mmol/L (ref 98–111)
Creatinine, Ser: 0.77 mg/dL (ref 0.61–1.24)
GFR, Estimated: >60 mL/min (ref >60)
Glucose, Bld: 125 mg/dL — ABNORMAL HIGH (ref 70–99)
Potassium: 3.5 mmol/L (ref 3.5–5.1)
Sodium: 139 mmol/L (ref 135–145)

## 2022-04-27 LAB — CBC
HCT: 29.8 % — ABNORMAL LOW (ref 39.0–52.0)
Hemoglobin: 10.4 g/dL — ABNORMAL LOW (ref 13.0–17.0)
MCH: 30.9 pg (ref 26.0–34.0)
MCHC: 34.9 g/dL (ref 30.0–36.0)
MCV: 88.4 fL (ref 80.0–100.0)
Platelets: 149 10*3/uL — ABNORMAL LOW (ref 150–400)
RBC: 3.37 MIL/uL — ABNORMAL LOW (ref 4.22–5.81)
RDW: 12.4 % (ref 11.5–15.5)
WBC: 7.3 10*3/uL (ref 4.0–10.5)
nRBC: 0 % (ref 0.0–0.2)

## 2022-04-27 LAB — GLUCOSE, CAPILLARY: Glucose-Capillary: 110 mg/dL — ABNORMAL HIGH (ref 70–99)

## 2022-04-27 LAB — HEPARIN LEVEL (UNFRACTIONATED): Heparin Unfractionated: <0.1 IU/mL — ABNORMAL LOW (ref 0.30–0.70)

## 2022-04-27 LAB — FIBRINOGEN: Fibrinogen: 586 mg/dL — ABNORMAL HIGH (ref 210–475)

## 2022-04-27 MED ORDER — APIXABAN 5 MG PO TABS: 5.0000 mg | ORAL_TABLET | Freq: Two times a day (BID) | ORAL | Status: DC

## 2022-04-27 MED ORDER — APIXABAN 2.5 MG PO TABS
2.5000 mg | ORAL_TABLET | Freq: Two times a day (BID) | ORAL | Status: DC
  Administered 2022-04-27: 2.5 mg via ORAL
  Filled 2022-04-27: qty 1

## 2022-04-27 MED ORDER — ASPIRIN 81 MG PO TBEC: 81.0000 mg | DELAYED_RELEASE_TABLET | Freq: Every day | ORAL | 0 refills | Status: DC

## 2022-04-27 MED ORDER — OXYCODONE-ACETAMINOPHEN 5-325 MG PO TABS: 1.0000 | ORAL_TABLET | Freq: Four times a day (QID) | ORAL | 0 refills | Status: DC | PRN

## 2022-04-27 NOTE — Plan of Care (Signed)
Problem: Education: Goal: Knowledge of General Education information will improve Description: Including pain rating scale, medication(s)/side effects and non-pharmacologic comfort measures Outcome: Adequate for Discharge   Problem: Health Behavior/Discharge Planning: Goal: Ability to manage health-related needs will improve Outcome: Adequate for Discharge   Problem: Clinical Measurements: Goal: Ability to maintain clinical measurements within normal limits will improve Outcome: Adequate for Discharge Goal: Will remain free from infection Outcome: Adequate for Discharge Goal: Diagnostic test results will improve Outcome: Adequate for Discharge Goal: Respiratory complications will improve Outcome: Adequate for Discharge Goal: Cardiovascular complication will be avoided Outcome: Adequate for Discharge   Problem: Activity: Goal: Risk for activity intolerance will decrease Outcome: Adequate for Discharge   Problem: Nutrition: Goal: Adequate nutrition will be maintained Outcome: Adequate for Discharge   Problem: Coping: Goal: Level of anxiety will decrease Outcome: Adequate for Discharge   Problem: Elimination: Goal: Will not experience complications related to bowel motility Outcome: Adequate for Discharge Goal: Will not experience complications related to urinary retention Outcome: Adequate for Discharge   Problem: Pain Managment: Goal: General experience of comfort will improve Outcome: Adequate for Discharge   Problem: Safety: Goal: Ability to remain free from injury will improve Outcome: Adequate for Discharge   Problem: Skin Integrity: Goal: Risk for impaired skin integrity will decrease Outcome: Adequate for Discharge   Problem: Education: Goal: Ability to describe self-care measures that may prevent or decrease complications (Diabetes Survival Skills Education) will improve Outcome: Adequate for Discharge Goal: Individualized Educational Video(s) Outcome:  Adequate for Discharge   Problem: Coping: Goal: Ability to adjust to condition or change in health will improve Outcome: Adequate for Discharge   Problem: Fluid Volume: Goal: Ability to maintain a balanced intake and output will improve Outcome: Adequate for Discharge   Problem: Health Behavior/Discharge Planning: Goal: Ability to identify and utilize available resources and services will improve Outcome: Adequate for Discharge Goal: Ability to manage health-related needs will improve Outcome: Adequate for Discharge   Problem: Metabolic: Goal: Ability to maintain appropriate glucose levels will improve Outcome: Adequate for Discharge   Problem: Nutritional: Goal: Maintenance of adequate nutrition will improve Outcome: Adequate for Discharge Goal: Progress toward achieving an optimal weight will improve Outcome: Adequate for Discharge   Problem: Skin Integrity: Goal: Risk for impaired skin integrity will decrease Outcome: Adequate for Discharge   Problem: Tissue Perfusion: Goal: Adequacy of tissue perfusion will improve Outcome: Adequate for Discharge   Problem: Education: Goal: Knowledge of the prescribed therapeutic regimen will improve Outcome: Adequate for Discharge   Problem: Bowel/Gastric: Goal: Gastrointestinal status for postoperative course will improve Outcome: Adequate for Discharge   Problem: Cardiac: Goal: Ability to maintain an adequate cardiac output will improve Outcome: Adequate for Discharge   Problem: Clinical Measurements: Goal: Postoperative complications will be avoided or minimized Outcome: Adequate for Discharge   Problem: Respiratory: Goal: Respiratory status will improve Outcome: Adequate for Discharge   Problem: Skin Integrity: Goal: Demonstration of wound healing without infection will improve Outcome: Adequate for Discharge   Problem: Urinary Elimination: Goal: Ability to achieve and maintain adequate renal perfusion and  functioning will improve Outcome: Adequate for Discharge   Problem: Acute Rehab PT Goals(only PT should resolve) Goal: Pt Will Go Supine/Side To Sit Outcome: Adequate for Discharge Goal: Patient Will Transfer Sit To/From Stand Outcome: Adequate for Discharge Goal: Pt Will Ambulate Outcome: Adequate for Discharge Goal: Pt Will Go Up/Down Stairs Outcome: Adequate for Discharge   Problem: Acute Rehab OT Goals (only OT should resolve) Goal: Pt. Will Perform Lower  Body Bathing Outcome: Adequate for Discharge Goal: Pt. Will Perform Lower Body Dressing Outcome: Adequate for Discharge Goal: Pt. Will Transfer To Toilet Outcome: Adequate for Discharge Goal: Pt. Will Perform Toileting-Clothing Manipulation Outcome: Adequate for Discharge

## 2022-04-27 NOTE — Progress Notes (Signed)
Discharge instructions (including medications) discussed with and copy provided to patient/caregiver 

## 2022-04-27 NOTE — Progress Notes (Addendum)
  Progress Note    04/27/2022 8:05 AM 5 Days Post-Op  Subjective:  walked in halls several times yesterday.  Tolerated regular diet for dinner last night.  Wants to go home   Vitals:   04/27/22 0415 04/27/22 0735  BP: 116/74 (!) 147/87  Pulse: 63 75  Resp: 16 (!) 21  Temp: 98.4 F (36.9 C) 97.6 F (36.4 C)  SpO2: 94% 97%   Physical Exam: Lungs:  non labored Incisions:  abd and groin incisions c/d/i Extremities:  palpable DP pulses L stronger than R Abdomen:  soft, NT, ND Neurologic: A&O  CBC    Component Value Date/Time   WBC 7.3 04/27/2022 0139   RBC 3.37 (L) 04/27/2022 0139   HGB 10.4 (L) 04/27/2022 0139   HCT 29.8 (L) 04/27/2022 0139   PLT 149 (L) 04/27/2022 0139   MCV 88.4 04/27/2022 0139   MCH 30.9 04/27/2022 0139   MCHC 34.9 04/27/2022 0139   RDW 12.4 04/27/2022 0139   LYMPHSABS 3.5 03/14/2022 0851   MONOABS 0.9 03/14/2022 0851   EOSABS 0.3 03/14/2022 0851   BASOSABS 0.1 03/14/2022 0851    BMET    Component Value Date/Time   NA 139 04/27/2022 0139   K 3.5 04/27/2022 0139   CL 107 04/27/2022 0139   CO2 25 04/27/2022 0139   GLUCOSE 125 (H) 04/27/2022 0139   BUN 7 04/27/2022 0139   CREATININE 0.77 04/27/2022 0139   CALCIUM 8.1 (L) 04/27/2022 0139   GFRNONAA >60 04/27/2022 0139    INR    Component Value Date/Time   INR 1.2 04/23/2022 1421    No intake or output data in the 24 hours ending 04/27/22 0805   Assessment/Plan:  46 y.o. male is s/p ABF 5 Days Post-Op   BLE well perfused Tolerating regular diet; 2 more BM's yesterday Walking hallways without difficulty Transition heparin to Eliquis D/c home this afternoon after initial Eliquis dose;  follow up for incision check in 2-3 weeks   Dagoberto Ligas, PA-C [facility] [phone] 04/27/2022 8:05 AM  I have interviewed the patient and examined the patient. I agree with the findings by the PA.  The patient is ambulating.  He is tolerating a regular diet.  His bowels  are moving.  His pain is controlled with p.o. meds.  He is ready for discharge today.  He says he has adequate support at home.  Gae Gallop, MD

## 2022-04-29 ENCOUNTER — Telehealth: Payer: Self-pay

## 2022-04-29 NOTE — Telephone Encounter (Addendum)
Pt called stating that he was discharged from the hospital this weekend and had some questions. He had been walking and experienced a slight increase in swelling and wanted to know if that was normal or if he overdid it.  Reviewed pt's chart, returned call for clarification, no answer, lf vm asking for a return call, no detailed info left.  Pt returned call.  Returned pt's call, two identifiers used. Pt explained that he was making laps around his yard a couple of times a day and noticed some swelling. Pt denies any pain and elevation helps with the edema. Encouraged pt to walk as much as possible, but stop if he had pain or the swelling became severe. Confirmed understanding.

## 2022-04-29 NOTE — Telephone Encounter (Signed)
Patient returned call.Patient requests to be called at ph# [phone]

## 2022-04-29 NOTE — Telephone Encounter (Signed)
Transition Care Management Unsuccessful Follow-up Telephone Call  Date of discharge and from where:  04/27/22 [place]   Attempts:  1st Attempt  Reason for unsuccessful TCM follow-up call:  Left voice message

## 2022-04-29 NOTE — Discharge Summary (Signed)
Aorta Discharge Summary    Bryan Hull 03-Jan-1976 46 y.o. male  [id number]  Admission Date: 04/22/2022  Discharge Date: 04/27/22  Physician: Dr. Virl Cagey Dr. Scot Dock  Admission Diagnosis: Claudication in peripheral vascular disease [facility]) [I73.9] Aortoiliac occlusive disease [facility]) [I74.09]  Discharge Day services:   See progress note 04/27/2022  Hospital Course: Mr. Bryan Hull is a 46 year old male who was brought in as an outpatient and underwent aortobifemoral bypass with right common femoral artery endarterectomy by Dr. Virl Cagey on 04/23/2022.  This was performed due to aortoiliac occlusive disease.  He tolerated the procedure and was admitted to the ICU postoperatively.  He was extubated in the OR.  He remained in the ICU for 3 days postoperatively.  This involved increasing mobility, replacing fluids, and postoperative pain control.  Much of his hospital stay also consisted of advancing his diet.  He maintained palpable DP pulses bilaterally throughout his hospital stay.  The abdomen and groin incisions appear to be healing well by the time of discharge.  It should also be noted that he was started on 500 units of IV heparin on the same day of surgery postoperatively due to factor V Leiden trait.  He was eventually started on Eliquis prior to discharge home.  There were no signs of bleeding postoperatively.  He will follow-up in office in about 2 to 3 weeks for incision check.  He will be prescribed 2 to 3 days of narcotic pain medication for continued postoperative pain control.  He was discharged home in stable condition.   CBC    Component Value Date/Time   WBC 7.3 04/27/2022 0139   RBC 3.37 (L) 04/27/2022 0139   HGB 10.4 (L) 04/27/2022 0139   HCT 29.8 (L) 04/27/2022 0139   PLT 149 (L) 04/27/2022 0139   MCV 88.4 04/27/2022 0139   MCH 30.9 04/27/2022 0139   MCHC 34.9 04/27/2022 0139   RDW 12.4 04/27/2022 0139   LYMPHSABS 3.5 03/14/2022 0851   MONOABS 0.9 03/14/2022 0851    EOSABS 0.3 03/14/2022 0851   BASOSABS 0.1 03/14/2022 0851    BMET    Component Value Date/Time   NA 139 04/27/2022 0139   K 3.5 04/27/2022 0139   CL 107 04/27/2022 0139   CO2 25 04/27/2022 0139   GLUCOSE 125 (H) 04/27/2022 0139   BUN 7 04/27/2022 0139   CREATININE 0.77 04/27/2022 0139   CALCIUM 8.1 (L) 04/27/2022 0139   GFRNONAA >60 04/27/2022 0139       Discharge Diagnosis:  Claudication in peripheral vascular disease ([place]) [I73.9] Aortoiliac occlusive disease ([place]) [I74.09]  Secondary Diagnosis: Patient Active Problem List   Diagnosis Date Noted   Claudication in peripheral vascular disease ([place]) 04/22/2022   Aortoiliac occlusive disease ([place]) 04/22/2022   Unintentional weight loss of more than 10 pounds in 90 days 03/25/2022   Type 2 diabetes mellitus with diabetic neuropathy, without long-term current use of insulin ([place]) 03/25/2022   Cigarette nicotine dependence without complication 61/60/7371   PAD (peripheral artery disease) ([place]) 03/25/2022   Past Medical History:  Diagnosis Date   Asthma    as a child/teenager   Diabetes mellitus without complication ([place])    Family history of factor V Leiden mutation    Peripheral vascular disease ([place])    PAD     Allergies as of 04/27/2022   No Known Allergies      Medication List     STOP taking these medications    aspirin 325 MG tablet Replaced by: aspirin  EC 81 MG tablet       TAKE these medications    aspirin EC 81 MG tablet Take 1 tablet (81 mg total) by mouth daily. Swallow whole. Replaces: aspirin 325 MG tablet   atorvastatin 40 MG tablet Commonly known as: Lipitor Take 1 tablet (40 mg total) by mouth daily.   CINNAMON PO Take 1 capsule by mouth daily.   Eliquis 2.5 MG Tabs tablet Generic drug: apixaban Take 1 tablet (2.5 mg total) by mouth 2 (two) times daily.   freestyle lancets SMARTSIG:Topical   FREESTYLE LITE test strip Generic drug: glucose blood 2 (two) times daily.    FreeStyle Lite w/Device Kit Please use as directed to check sugar twice daily.   glipiZIDE 2.5 MG 24 hr tablet Commonly known as: GLUCOTROL XL Take 1 tablet (2.5 mg total) by mouth daily with breakfast.   lidocaine 5 % Commonly known as: LIDODERM Place 1 patch onto the skin daily as needed (When walking). Remove & Discard patch within 12 hours or as directed by MD   metFORMIN 500 MG tablet Commonly known as: GLUCOPHAGE Take 1 tablet (500 mg total) by mouth daily with breakfast.   oxyCODONE-acetaminophen 5-325 MG tablet Commonly known as: PERCOCET/ROXICET Take 1 tablet by mouth every 6 (six) hours as needed for moderate pain.   pregabalin 25 MG capsule Commonly known as: LYRICA Take 1 capsule (25 mg total) by mouth 2 (two) times daily.   pregabalin 50 MG capsule Commonly known as: Lyrica Take 1 capsule (50 mg total) by mouth 2 (two) times daily.   varenicline 0.5 MG tablet Commonly known as: Chantix Days 1 to 3: Oral: 0.5 mg once daily. Days 4 to 7: Oral: 0.5 mg twice daily What changed:  how much to take how to take this when to take this   varenicline 1 MG tablet Commonly known as: CHANTIX Take 1 tablet (1 mg total) by mouth 2 (two) times daily. What changed: Another medication with the same name was changed. Make sure you understand how and when to take each.        Instructions:  [facility] Discharge Instructions Open Aortic Surgery  Please refer to the following instructions for your post-procedure care. Your surgeon or Physician Assistant will discuss any changes with you.  Activity  Avoid lifting more than eight pounds (a gallon of milk) until after your first post-operative visit. You are encouraged to walk as much as you can. You can slowly return to normal activities but must avoid strenuous activity and heavy lifting until your doctor tells you it's OK. Heavy lifting can hurt the incision and cause a hernia. Avoid  activities such as vacuuming or swinging a golf club. It is normal to feel tired for several weeks after your surgery. Do not drive until your doctor gives the OK and you are no longer taking prescription pain medications. It is also normal to have difficulty with sleep habits, eating and bowl movements after surgery. These will go away with time.  Bathing/Showering  You may shower after you go home. Do not soak in a bathtub, hot tub, or swim until the incision heals.  Incision Care  Shower every day. Clean your incision with mild soap and water. Pat the area dry with a clean towel. You do not need a bandage unless otherwise instructed. Do not apply any ointments or creams to your incision. You may have skin glue on your incision. Do not peel it off. It will  come off on its own in about one week. If you have staples or sutures along your incision, they will be removed at your post op appointment.  If you have groin incisions, wash the groin wounds with soap and water daily and pat dry. (No tub bath-only shower)  Then put a dry gauze or washcloth in the groin to keep this area dry to help prevent wound infection.  Do this daily and as needed.  Do not use Vaseline or neosporin on your incisions.  Only use soap and water on your incisions and then protect and keep dry.  Diet  Resume your normal diet. There are no special food restriction following this procedure. A low fat/low cholesterol diet is recommended for all patients with vascular disease. After your aortic surgery, it's normal to feel full faster than usual and to not feel as hungry as you normally would. You will probably lose weight initially following your surgery. It's best to eat small, frequent meals over the course of the day. Call the office if you find that you are unable to eat even small meals. In order to heal from your surgery, it is CRITICAL to get adequate nutrition. Your body requires vitamins, minerals, and protein. Vegetables  are the best source of vitamins and minerals. causing pain, you may take over-the-counter pain reliever such as acetaminophen (Tylenol). If you were prescribed a stronger pain medication, please be aware these medication can cause nausea and constipation. Prevent nausea by taking the medication with a snack or meal. Avoid constipation by drinking plenty of fluids and eating foods with a high amount of fiber, such as fruits, vegetables and grains. Take 162m of the over-the-counter stool softener Colace twice a day as needed to help with constipation. A laxative, such as Milk of Magnesia, may be recommended for you at this time. Do not take a laxative unless your surgeon or Physician Assistant. tells you it's OK. Do not take Tylenol if you are taking stronger pain medications (such as Percocet).  Follow Up  Our office will schedule a follow up appointment 2-3 weeks after discharge.  Please call [facility] for any of the following conditions    .     Severe or worsening pain in your legs or feet or in your abdomen back or chest. Increased pain, redness drainage (pus) from your incision site. Increased abdominal pain, bloating, nausea, vomiting, or persistent diarrhea. Fever of 101 degrees or higher. Swelling in your leg (s).  Reduce your risk of vascular disease  Stop smoking. If you would like help, call [facility] at 1-800-QUIT-NOW (224-578-0481 or [facility] 32257720115 Manage your cholesterol Maintain a desired weight Control your diabetes Keep your blood pressure down  If you have any questions please call the office at [phone]    Disposition: home  Patient's condition: is Good  Follow up: 1. [facility] in 2 weeks   MDagoberto Ligas PA-C [facility] [phone]/08/2021  8:27 AM   - For VQI Registry use -  Post-op:  Time to Extubation: _0  In OR, _1  < 12 hrs, _2  12-24 hrs, _3  >=24 hrs Vasopressors Req. Post-op: No ICU Stay: 3 day in  ICU Transfusion: No  MI: No, _4  Troponin only, _5  EKG or Clinical New Arrhythmia: No  Complications: CHF: No Resp failure: No, _6  Pneumonia, _7  Ventilator Chg in renal function: No, _8  Inc. Cr > 0.5, _9  Temp. Dialysis, _10  Permanent dialysis Leg  ischemia: No, no Surgery needed, _0  Yes, Surgery needed, _1  Amputation Bowel ischemia: No, _2  Medical Rx, _3  Surgical Rx Wound complication: No, <[id number]>_2<\/[id number]>_5  Superficial separation/infection, _5  Return to OR Return to OR: No  Return to OR for bleeding: No Stroke: No, _6  Minor, _7  Major  Discharge medications: Statin use:  Yes  ASA use:  Yes   Plavix use:  No  Beta blocker use:  No  ACEI use:  No ARB use:  No CCB use:  No Coumadin use:  Yes, Eliquis

## 2022-04-29 NOTE — Telephone Encounter (Signed)
Transition Care Management Follow-up Telephone Call Date of discharge and from where: [place] 04/27/22 How have you been since you were released from the hospital? [facility]  Any questions or concerns? Yes, about blood sugars dropping lower in the 50 after medication at times   Items Reviewed: Did the pt receive and understand the discharge instructions provided? Yes  Medications obtained and verified? Yes  Other? No  Any new allergies since your discharge? No  Dietary orders reviewed? Yes Do you have support at home? Yes   Home Care and Equipment/Supplies: Were home health services ordered? not applicable If so, what is the name of the agency?   Has the agency set up a time to come to the patient's home? not applicable Were any new equipment or medical supplies ordered?  No What is the name of the medical supply agency?  Were you able to get the supplies/equipment? not applicable Do you have any questions related to the use of the equipment or supplies? No  Functional Questionnaire: (I = Independent and D = Dependent) ADLs: I  Bathing/Dressing- I  Meal Prep- I  Eating- I  Maintaining continence- I  Transferring/Ambulation- I  Managing Meds- I  Follow up appointments reviewed:  PCP Hospital f/u appt confirmed? Yes  Scheduled to see Alyssa [name]  on 05/03/22 @ 10:30. [place] [facility] f/u appt confirmed? Yes  Scheduled to see Vascular  on 05/10/22 @ 10:00. Are transportation arrangements needed? No  If their condition worsens, is the pt aware to call PCP or go to the Emergency Dept.? Yes Was the patient provided with contact information for the PCP's office or ED? Yes Was to pt encouraged to call back with questions or concerns? Yes

## 2022-04-30 ENCOUNTER — Other Ambulatory Visit (HOSPITAL_COMMUNITY): Payer: Self-pay

## 2022-04-30 ENCOUNTER — Telehealth (HOSPITAL_COMMUNITY): Payer: Self-pay

## 2022-04-30 NOTE — Telephone Encounter (Signed)
Pharmacy Transitions of Care Follow-up Telephone Call  Date of discharge: 04/27/22  Discharge Diagnosis: PVD  How have you been since you were released from the hospital? He has been doing well since his discharge. We discussed some of the s/sx of bleeding to watch for and when to return to the hospital. He endorsed tarry stools during his hospitalization, however, he has not experienced any since. Encouraged him to return to the hospital if the symptoms returned.  Additionally, he has been recently diagnosed with DM. He has been experiencing some hypoglycemic episodes. I told him that if he continues to experience any readings below 70, he should reach out to his PCP and hold his glipizide until he talks with them. He has an appointment scheduled with their office for this week.  Made his PCP aware for their upcoming appointment.   Medication changes made at discharge: START taking these medications  START taking these medications  aspirin EC 81 MG tablet Take 1 tablet (81 mg total) by mouth daily. Swallow whole. Replaces: aspirin 325 MG tablet  Eliquis 2.5 MG Tabs tablet Generic drug: apixaban Take 1 tablet (2.5 mg total) by mouth 2 (two) times daily.  oxyCODONE-acetaminophen 5-325 MG tablet Commonly known as: PERCOCET/ROXICET Take 1 tablet by mouth every 6 (six) hours as needed for moderate pain.   CHANGE how you take these medications  CHANGE how you take these medications  * varenicline 0.5 MG tablet Commonly known as: Chantix Days 1 to 3: Oral: 0.5 mg once daily. Days 4 to 7: Oral: 0.5 mg twice daily What changed:  how much to take how to take this when to take this  * varenicline 1 MG tablet Commonly known as: CHANTIX Take 1 tablet (1 mg total) by mouth 2 (two) times daily. What changed: Another medication with the same name was changed. Make sure you understand how and when to take each.   very important  * This list has 2 medication(s) that are the same as other  medications prescribed for you. Read the directions carefully, and ask your doctor or other care provider to review them with you.    CONTINUE taking these medications  CONTINUE taking these medications  atorvastatin 40 MG tablet Commonly known as: Lipitor Take 1 tablet (40 mg total) by mouth daily.  CINNAMON PO  freestyle lancets  FREESTYLE LITE test strip Generic drug: glucose blood  FreeStyle Lite w/Device Kit Please use as directed to check sugar twice daily.  glipiZIDE 2.5 MG 24 hr tablet Commonly known as: GLUCOTROL XL Take 1 tablet (2.5 mg total) by mouth daily with breakfast.  lidocaine 5 % Commonly known as: LIDODERM  metFORMIN 500 MG tablet Commonly known as: GLUCOPHAGE Take 1 tablet (500 mg total) by mouth daily with breakfast.  * pregabalin 25 MG capsule Commonly known as: LYRICA Take 1 capsule (25 mg total) by mouth 2 (two) times daily.  * pregabalin 50 MG capsule Commonly known as: Lyrica Take 1 capsule (50 mg total) by mouth 2 (two) times daily.    Medication changes verified by the patient? yes    Medication Accessibility:  Home Pharmacy: [facility]   Was the patient provided with refills on discharged medications? no   Have all prescriptions been transferred from [facility] to home pharmacy? No refills provided- discussed with the patient to request refills at his follow up appointment next week  Is the patient interested in using a [place]? no  Medication Review: APIXABAN (ELIQUIS)  Apixaban 2.5 mg  BID initiated on 9/30 for PVD right common femoral artery endarterectomy procedure. - Discussed importance of taking medication around the same time every day.  - Advised patient of medications to avoid (NSAIDs, ASA maintenance doses>100 mg daily)  - Educated that Tylenol (acetaminophen) will be the preferred analgesic to prevent risk of bleeding. - Emphasized importance of monitoring for signs and symptoms of bleeding (abnormal bruising, prolonged  bleeding, nose bleeds, bleeding from gums, discolored urine, black tarry stools)  - Advised patient to alert all providers of anticoagulation therapy prior to starting a new medication or having a procedure.  Follow-up Appointments:  Date Visit Type Length Department   05/03/2022 10:30 AM HOSPITAL FU 30 min [place] [[id number]]  Patient Instructions:   Please arrive 15 minutes prior to your appointment.         05/10/2022 10:00 AM POST OP 20 min [facility] [[id number]]  Patient Instructions:            05/24/2022  8:30 AM OFFICE VISIT 30 min [place] [[id number]]  Patient Instructions:   Please arrive 15 minutes prior to your appointment. This will allow [place] to verify and update your medical record and ensure a full appointment for you within the time allotted.         Maryan Puls, PharmD PGY-1 [facility] Pharmacy Resident

## 2022-05-02 NOTE — Progress Notes (Addendum)
POST OPERATIVE OFFICE NOTE    CC:  F/u for surgery  HPI:  This is a 46 y.o. male who is s/p end to end ABF with inferior mesenteric vein ligation and right CFA endarterectomy on 04/22/2022 by Dr. Virl Cagey for LLE CLI with rest pain.    He had palpable DP pulses bilaterally.  He was started on heparin 500U on the day of surgery due to factor V leiden trait and was started on Eliquis prior to discharge home.    Pt returns today for follow up.  Pt states overall he is doing well.  He states he still has some numbness in his feet (left worse than right) but the numbness and pain in his hips and lower legs are improved.  His appetite is okay.  He has quit smoking cigarettes since the day before surgery.   No Known Allergies  Current Outpatient Medications  Medication Sig Dispense Refill   apixaban (ELIQUIS) 2.5 MG TABS tablet Take 1 tablet (2.5 mg total) by mouth 2 (two) times daily. 60 tablet 0   aspirin EC 81 MG tablet Take 1 tablet (81 mg total) by mouth daily. Swallow whole. 30 tablet 0   atorvastatin (LIPITOR) 40 MG tablet Take 1 tablet (40 mg total) by mouth daily. 90 tablet 3   Blood Glucose Monitoring Suppl (FREESTYLE LITE) w/Device KIT Please use as directed to check sugar twice daily. 1 kit 2   CINNAMON PO Take 1 capsule by mouth daily.     FREESTYLE LITE test strip 2 (two) times daily.     glipiZIDE (GLUCOTROL XL) 2.5 MG 24 hr tablet Take 1 tablet (2.5 mg total) by mouth daily with breakfast. 30 tablet 0   Lancets (FREESTYLE) lancets SMARTSIG:Topical     lidocaine (LIDODERM) 5 % Place 1 patch onto the skin daily as needed (When walking). Remove & Discard patch within 12 hours or as directed by MD     metFORMIN (GLUCOPHAGE) 500 MG tablet Take 1 tablet (500 mg total) by mouth daily with breakfast. 90 tablet 0   oxyCODONE-acetaminophen (PERCOCET/ROXICET) 5-325 MG tablet Take 1 tablet by mouth every 6 (six) hours as needed for moderate pain. 20 tablet 0   pregabalin (LYRICA) 25 MG capsule  Take 1 capsule (25 mg total) by mouth 2 (two) times daily. (Patient not taking: Reported on 04/17/2022) 60 capsule 2   pregabalin (LYRICA) 50 MG capsule Take 1 capsule (50 mg total) by mouth 2 (two) times daily. 60 capsule 0   varenicline (CHANTIX) 0.5 MG tablet Days 1 to 3: Oral: 0.5 mg once daily. Days 4 to 7: Oral: 0.5 mg twice daily (Patient taking differently: Take 1 mg by mouth daily. Days 1 to 3: Oral: 0.5 mg once daily. Days 4 to 7: Oral: 0.5 mg twice daily) 11 tablet 0   varenicline (CHANTIX) 1 MG tablet Take 1 tablet (1 mg total) by mouth 2 (two) times daily. 60 tablet 2   No current facility-administered medications for this visit.     ROS:  See HPI  Physical Exam:  Today's Vitals   05/10/22 0957  BP: 124/88  Pulse: 74  Resp: 20  Temp: 97.6 F (36.4 C)  TempSrc: Oral  SpO2: 99%  Weight: 204 lb 1.6 oz (92.6 kg)  Height: 6' 2"  (1.88 m)   Body mass index is 26.2 kg/m.   Incision:  bilateral groins and laparotomy incisions are healing nicely. Extremities:  palpable right DP/PT and palpable left DP; bilateral feet are warm Neuro:  in tact Abdomen:  soft, NT/ND    Assessment/Plan:  This is a 46 y.o. male who is s/p: end to end ABF with inferior mesenteric vein ligation and right CFA endarterectomy on 04/22/2022 by Dr. Virl Cagey for LLE CLI with rest pain.   -pt doing well from vascular standpoint with palpable pedal pulses.  His hip pain has resolved.  His incisions are healing nicely.  Dr. Virl Cagey discussed with pt that it may take many  months for numbness in feet to improve and may not completely resolve.   -discussed with pt no tub baths until incisions have completely healed.   -he has quit smoking and I gave him a lot of praise for this and discussed the importance of not starting back smoking.   -he will f/u in one year with ABI; he knows to call sooner if he has any issues before the. -continue statin.  He is on Eliquis for hx of Factor V Leiden.   -ok to drive now as  long as he is not taking pain medication.  May slowly start back with lifting more than a gallon of milk at the 2 month mark.   -note given to pt for return to work 06/28/2022.   Leontine Locket, [facility] [phone]   Clinic MD:  pt seen and examined with Dr. Virl Cagey

## 2022-05-03 ENCOUNTER — Encounter: Payer: Self-pay | Admitting: Physician Assistant

## 2022-05-03 ENCOUNTER — Ambulatory Visit (INDEPENDENT_AMBULATORY_CARE_PROVIDER_SITE_OTHER): Payer: 59 | Admitting: Physician Assistant

## 2022-05-03 VITALS — BP 115/77 | HR 76 | Temp 98.0°F | Ht 74.0 in | Wt 209.2 lb

## 2022-05-03 DIAGNOSIS — I7409 Other arterial embolism and thrombosis of abdominal aorta: Secondary | ICD-10-CM | POA: Diagnosis not present

## 2022-05-03 DIAGNOSIS — R9389 Abnormal findings on diagnostic imaging of other specified body structures: Secondary | ICD-10-CM

## 2022-05-03 DIAGNOSIS — D649 Anemia, unspecified: Secondary | ICD-10-CM

## 2022-05-03 DIAGNOSIS — F17210 Nicotine dependence, cigarettes, uncomplicated: Secondary | ICD-10-CM

## 2022-05-03 DIAGNOSIS — R634 Abnormal weight loss: Secondary | ICD-10-CM | POA: Diagnosis not present

## 2022-05-03 DIAGNOSIS — R7989 Other specified abnormal findings of blood chemistry: Secondary | ICD-10-CM | POA: Insufficient documentation

## 2022-05-03 DIAGNOSIS — I739 Peripheral vascular disease, unspecified: Secondary | ICD-10-CM

## 2022-05-03 DIAGNOSIS — D6851 Activated protein C resistance: Secondary | ICD-10-CM

## 2022-05-03 DIAGNOSIS — E1140 Type 2 diabetes mellitus with diabetic neuropathy, unspecified: Secondary | ICD-10-CM | POA: Diagnosis not present

## 2022-05-03 LAB — CBC WITH DIFFERENTIAL/PLATELET
Basophils Absolute: 0.1 10*3/uL (ref 0.0–0.1)
Basophils Relative: 0.7 % (ref 0.0–3.0)
Eosinophils Absolute: 0.4 10*3/uL (ref 0.0–0.7)
Eosinophils Relative: 3.2 % (ref 0.0–5.0)
HCT: 41.9 % (ref 39.0–52.0)
Hemoglobin: 14.1 g/dL (ref 13.0–17.0)
Lymphocytes Relative: 24.5 % (ref 12.0–46.0)
Lymphs Abs: 2.7 10*3/uL (ref 0.7–4.0)
MCHC: 33.7 g/dL (ref 30.0–36.0)
MCV: 89.5 fl (ref 78.0–100.0)
Monocytes Absolute: 1 10*3/uL (ref 0.1–1.0)
Monocytes Relative: 8.5 % (ref 3.0–12.0)
Neutro Abs: 7 10*3/uL (ref 1.4–7.7)
Neutrophils Relative %: 63.1 % (ref 43.0–77.0)
Platelets: 352 10*3/uL (ref 150.0–400.0)
RBC: 4.68 Mil/uL (ref 4.22–5.81)
RDW: 13.4 % (ref 11.5–15.5)
WBC: 11.1 10*3/uL — ABNORMAL HIGH (ref 4.0–10.5)

## 2022-05-03 LAB — IBC + FERRITIN
Ferritin: 295.9 ng/mL (ref 22.0–322.0)
Iron: 43 ug/dL (ref 42–165)
Saturation Ratios: 12.3 % — ABNORMAL LOW (ref 20.0–50.0)
TIBC: 348.6 ug/dL (ref 250.0–450.0)
Transferrin: 249 mg/dL (ref 212.0–360.0)

## 2022-05-03 LAB — PTH, INTACT (ICMA) AND IONIZED CALCIUM
Calcium, Ion: 5.2 mg/dL (ref 4.7–5.5)
Calcium: 10 mg/dL (ref 8.6–10.3)
PTH: 12 pg/mL — ABNORMAL LOW (ref 16–77)

## 2022-05-03 LAB — POCT GLYCOSYLATED HEMOGLOBIN (HGB A1C): Hemoglobin A1C: 6 % — AB (ref 4.0–5.6)

## 2022-05-03 MED ORDER — OXYCODONE-ACETAMINOPHEN 5-325 MG PO TABS: 1.0000 | ORAL_TABLET | Freq: Four times a day (QID) | ORAL | 0 refills | Status: DC | PRN

## 2022-05-03 NOTE — Progress Notes (Signed)
Chief Complaint:  Bryan Hull is a 46 y.o. male who presents today for a TCM visit. He is accompanied by his mother today who drove.    Subjective:  HPI:  Summary of Hospital admission: Reason for admission: Claudication in peripheral vascular disease Date of admission: 04/22/22 Date of discharge: 04/27/22 Date of Interactive contact: 04/29/22 Summary of Hospital course: Brought in outpatient as patient with Dr. Virl Cagey for aortobifemoral bypass with right common femoral artery endarterectomy. Procedure completed, transferred to ICU for 3 days. Healing progressed well. Heparin IV started due to newly found factor V Leiden trait, then started on Eliquis.  Interim history:  Recovering slowly, but well. Still feeling generalized weakness. Walking at least twice daily in the yard. Appetite slowly increasing. Would appreciate a few more pain tablets to get through the next few days prior to his walks. Still concerned about ongoing unintentional weight loss. Has been experiencing blood sugars dropping into the 50s a few times this week.   ROS: General weakness, weight loss abnormal, otherwise a complete review of systems was negative.   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[place]) 04/22/2022   Unintentional weight loss of more than 10 pounds in 90 days 03/25/2022   Type 2 diabetes mellitus with diabetic neuropathy, without long-term current use of insulin ([place]) 03/25/2022   Cigarette  nicotine dependence without complication 05/14/5101   PAD (peripheral artery disease) ([place]) 03/25/2022   Past Surgical History:  Procedure Laterality Date   AORTA - BILATERAL FEMORAL ARTERY BYPASS GRAFT N/A 04/22/2022   Procedure: AORTOBIFEMORAL BYPASS GRAFT;  Surgeon: [name], MD;  [facility];  Service: Vascular;  Laterality: N/A;   APPENDECTOMY     WISDOM TOOTH EXTRACTION      Family History  Problem Relation Age of Onset   Diabetes Father    Hyperlipidemia Father     Medications- Reconciled discharge and current medications in Epic.  Current Outpatient Medications  Medication Sig Dispense Refill   apixaban (ELIQUIS) 2.5 MG TABS tablet Take 1 tablet (2.5 mg total) by mouth 2 (two) times daily. 60 tablet 0   aspirin EC 81 MG tablet Take 1 tablet (81 mg total) by mouth daily. Swallow whole. 30 tablet 0   atorvastatin (LIPITOR) 40 MG tablet Take 1 tablet (40 mg total) by mouth daily. 90 tablet 3   Blood Glucose Monitoring Suppl (FREESTYLE LITE) w/Device KIT Please use as directed to check sugar twice daily. 1 kit 2   CINNAMON PO Take 1 capsule by mouth daily.     FREESTYLE LITE test strip 2 (two) times daily.     Lancets (FREESTYLE) lancets SMARTSIG:Topical     pregabalin (LYRICA) 50 MG capsule Take 1 capsule (50 mg total) by mouth 2 (two) times daily. 60 capsule 0   varenicline (CHANTIX) 0.5 MG tablet Days 1 to 3: Oral: 0.5 mg once daily. Days 4 to 7: Oral: 0.5 mg twice daily (Patient taking differently:  Take 1 mg by mouth daily. Days 1 to 3: Oral: 0.5 mg once daily. Days 4 to 7: Oral: 0.5 mg twice daily) 11 tablet 0   varenicline (CHANTIX) 1 MG tablet Take 1 tablet (1 mg total) by mouth 2 (two) times daily. 60 tablet 2   lidocaine (LIDODERM) 5 % Place 1 patch onto the skin daily as needed (When walking). Remove & Discard patch within 12 hours or as directed by MD (Patient not taking: Reported on 05/03/2022)     oxyCODONE-acetaminophen (PERCOCET/ROXICET) 5-325 MG tablet  Take 1 tablet by mouth every 6 (six) hours as needed for moderate pain. 10 tablet 0   No current facility-administered medications for this visit.    Allergies-reviewed and updated No Known Allergies  Social History   Socioeconomic History   Marital status: Married    Spouse name: Not on file   Number of children: 2   Years of education: Not on file   Highest education level: Not on file  Occupational History   Not on file  Tobacco Use   Smoking status: Former    Packs/day: 0.25    Types: Cigarettes    Start date: 05/21/1993   Smokeless tobacco: Never  Vaping Use   Vaping Use: Some days  Substance and Sexual Activity   Alcohol use: Yes    Comment: rare   Drug use: Yes    Types: Marijuana   Sexual activity: Yes    Birth control/protection: None  Other Topics Concern   Not on file  Social History Narrative   Not on file   Social Determinants of Health   Financial Resource Strain: Not on file  Food Insecurity: Not on file  Transportation Needs: Not on file  Physical Activity: Not on file  Stress: Not on file  Social Connections: Not on file        Objective:  Physical Exam: BP 115/77 (BP Location: Left Arm)   Pulse 76   Temp 98 F (36.7 C) (Temporal)   Ht 6' 2"  (1.88 m)   Wt 209 lb 3.2 oz (94.9 kg)   SpO2 99%   BMI 26.86 kg/m   Gen: NAD, resting comfortably CV: RRR with no murmurs appreciated, no edema Pulm: NWOB, CTAB with no crackles, wheezes, or rhonchi GI: Normal bowel sounds present. Soft, Nontender, Nondistended. Surgical scars healing well.  MSK: No edema, cyanosis, or clubbing noted Skin: Warm, dry Neuro: Grossly normal, moves all extremities Psych: Normal affect and thought content  Assessment/Plan:   1. 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 Claudication in peripheral vascular disease ([place]) Recovering from surgery, doing well  He will follow-up with Dr. Virl Cagey PDMP reviewed today, no red flags, filling appropriately.  Provided 10 more  tablets of Percocet to take 1 tablet prior to walking that he has been doing.   3. Type 2 diabetes mellitus with diabetic neuropathy, without long-term current use of insulin ([place]) Plan to stop metformin and glipizide at this time.  A1c is now 6.0.  He has been experiencing hypoglycemic events.  He is doing really good with lifestyle control.  We can continue to monitor closely.  4. Unintentional weight loss of more than 10 pounds in 90 days He has lost a reported 22% of body weight and approximately the last 6 months.  Extensive work-up has been completed.  He has not had colon cancer screening and this would be strongly advised at this time.  5. Low hemoglobin Plan to recheck hemoglobin.  Likely postsurgical and hopefully  back to normal.  6. Hypocalcemia Plan to recheck ionized Calcium and PTH level.  7. Cigarette nicotine dependence without complication He is completely off cigarettes.  Very proud of him.  Encouraged him to keep up the good work.  8. Factor V Leiden ([place]) Patient is now taking Eliquis 2.5 mg twice daily.  Plan to get him in with hematology oncology.  9. Abnormal chest x-ray Reviewed abnormal chest x-ray showing new left basilar opacity.  Plan to get a new x-ray at this time.     Summary/Review of work up during hospitalization: Blood work including CBC, hemoglobin noted to be low at 10.4; Calcium noted to be low; glucose levels in 110 - 124 range; CXR on 04/23/22 - shows new left basilar and retrocardiac opacity. Abdominal XRAY showing NG tube. MRSA, staph aureus swabs negative.       Patient has a high level of medical decision making.    Time Spent: 42 minutes of total time was spent on the date of the encounter performing the following actions: chart review prior to seeing the patient, obtaining history, performing a medically necessary exam, counseling on the treatment plan, placing orders, and documenting in our EHR.   Rosendo Couser, PA-C

## 2022-05-03 NOTE — Patient Instructions (Addendum)
Stop Metformin and Glipizide. Your A1c is 6.0! Excellent! Keep up good work with lifestyle control!   Labs today - will call with results  Please complete Cologuard   Let's start to wean off the Lyrica - currently 50 mg twice daily; let's decrease to 25 mg twice daily for the next week. If tolerating well, then drop to 25 mg once daily for 1 week, then off altogether.   Keep up the great work staying off cigarettes!!!!

## 2022-05-06 ENCOUNTER — Other Ambulatory Visit: Payer: Self-pay

## 2022-05-06 DIAGNOSIS — R634 Abnormal weight loss: Secondary | ICD-10-CM

## 2022-05-07 DIAGNOSIS — R9389 Abnormal findings on diagnostic imaging of other specified body structures: Secondary | ICD-10-CM | POA: Insufficient documentation

## 2022-05-08 ENCOUNTER — Other Ambulatory Visit: Payer: Self-pay

## 2022-05-08 ENCOUNTER — Ambulatory Visit (INDEPENDENT_AMBULATORY_CARE_PROVIDER_SITE_OTHER)
Admission: RE | Admit: 2022-05-08 | Discharge: 2022-05-08 | Disposition: A | Discharge status: Home / Self Care | Payer: 59 | Source: Ambulatory Visit | Attending: Physician Assistant | Admitting: Physician Assistant

## 2022-05-08 DIAGNOSIS — R9389 Abnormal findings on diagnostic imaging of other specified body structures: Secondary | ICD-10-CM | POA: Diagnosis not present

## 2022-05-08 DIAGNOSIS — D649 Anemia, unspecified: Secondary | ICD-10-CM

## 2022-05-08 DIAGNOSIS — R634 Abnormal weight loss: Secondary | ICD-10-CM | POA: Diagnosis not present

## 2022-05-10 ENCOUNTER — Encounter: Payer: Self-pay | Admitting: Vascular Surgery

## 2022-05-10 ENCOUNTER — Telehealth: Payer: Self-pay | Admitting: Internal Medicine

## 2022-05-10 ENCOUNTER — Ambulatory Visit (INDEPENDENT_AMBULATORY_CARE_PROVIDER_SITE_OTHER): Payer: 59 | Admitting: Physician Assistant

## 2022-05-10 VITALS — BP 124/88 | HR 74 | Temp 97.6°F | Resp 20 | Ht 74.0 in | Wt 204.1 lb

## 2022-05-10 DIAGNOSIS — I70222 Atherosclerosis of native arteries of extremities with rest pain, left leg: Secondary | ICD-10-CM

## 2022-05-10 DIAGNOSIS — Z95828 Presence of other vascular implants and grafts: Secondary | ICD-10-CM

## 2022-05-10 NOTE — Telephone Encounter (Signed)
Scheduled appt per 10/9 referral. Pt is aware of appt date and time. Pt is aware to arrive 15 mins prior to appt time and to bring and updated insurance card. Pt is aware of appt location.

## 2022-05-20 ENCOUNTER — Inpatient Hospital Stay: Payer: 59 | Attending: Internal Medicine

## 2022-05-20 ENCOUNTER — Other Ambulatory Visit: Payer: Self-pay

## 2022-05-20 ENCOUNTER — Inpatient Hospital Stay (HOSPITAL_BASED_OUTPATIENT_CLINIC_OR_DEPARTMENT_OTHER): Payer: 59 | Admitting: Internal Medicine

## 2022-05-20 VITALS — BP 126/73 | HR 73 | Temp 97.8°F | Resp 18 | Ht 74.0 in | Wt 205.1 lb

## 2022-05-20 DIAGNOSIS — I7143 Infrarenal abdominal aortic aneurysm, without rupture: Secondary | ICD-10-CM | POA: Insufficient documentation

## 2022-05-20 DIAGNOSIS — E119 Type 2 diabetes mellitus without complications: Secondary | ICD-10-CM | POA: Insufficient documentation

## 2022-05-20 DIAGNOSIS — I739 Peripheral vascular disease, unspecified: Secondary | ICD-10-CM | POA: Insufficient documentation

## 2022-05-20 DIAGNOSIS — Z832 Family history of diseases of the blood and blood-forming organs and certain disorders involving the immune mechanism: Secondary | ICD-10-CM | POA: Insufficient documentation

## 2022-05-20 DIAGNOSIS — Z800 Family history of malignant neoplasm of digestive organs: Secondary | ICD-10-CM | POA: Diagnosis not present

## 2022-05-20 DIAGNOSIS — Z8572 Personal history of non-Hodgkin lymphomas: Secondary | ICD-10-CM | POA: Diagnosis not present

## 2022-05-20 DIAGNOSIS — R634 Abnormal weight loss: Secondary | ICD-10-CM | POA: Diagnosis not present

## 2022-05-20 DIAGNOSIS — Z8041 Family history of malignant neoplasm of ovary: Secondary | ICD-10-CM | POA: Insufficient documentation

## 2022-05-20 DIAGNOSIS — Z87891 Personal history of nicotine dependence: Secondary | ICD-10-CM | POA: Diagnosis not present

## 2022-05-20 DIAGNOSIS — Z7901 Long term (current) use of anticoagulants: Secondary | ICD-10-CM | POA: Diagnosis not present

## 2022-05-20 DIAGNOSIS — Z79899 Other long term (current) drug therapy: Secondary | ICD-10-CM | POA: Insufficient documentation

## 2022-05-20 DIAGNOSIS — D6851 Activated protein C resistance: Secondary | ICD-10-CM

## 2022-05-20 LAB — COMPREHENSIVE METABOLIC PANEL
ALT: 15 U/L (ref 0–44)
AST: 12 U/L — ABNORMAL LOW (ref 15–41)
Albumin: 3.9 g/dL (ref 3.5–5.0)
Alkaline Phosphatase: 66 U/L (ref 38–126)
Anion gap: 4 — ABNORMAL LOW (ref 5–15)
BUN: 10 mg/dL (ref 6–20)
CO2: 29 mmol/L (ref 22–32)
Calcium: 8.8 mg/dL — ABNORMAL LOW (ref 8.9–10.3)
Chloride: 101 mmol/L (ref 98–111)
Creatinine, Ser: 0.95 mg/dL (ref 0.61–1.24)
GFR, Estimated: >60 mL/min (ref >60)
Glucose, Bld: 182 mg/dL — ABNORMAL HIGH (ref 70–99)
Potassium: 4 mmol/L (ref 3.5–5.1)
Sodium: 134 mmol/L — ABNORMAL LOW (ref 135–145)
Total Bilirubin: 1 mg/dL (ref 0.3–1.2)
Total Protein: 6.8 g/dL (ref 6.5–8.1)

## 2022-05-20 LAB — CBC WITH DIFFERENTIAL/PLATELET
Abs Immature Granulocytes: 0.02 10*3/uL (ref 0.00–0.07)
Basophils Absolute: 0.1 10*3/uL (ref 0.0–0.1)
Basophils Relative: 1 %
Eosinophils Absolute: 0.3 10*3/uL (ref 0.0–0.5)
Eosinophils Relative: 3 %
HCT: 38.3 % — ABNORMAL LOW (ref 39.0–52.0)
Hemoglobin: 13.1 g/dL (ref 13.0–17.0)
Immature Granulocytes: 0 %
Lymphocytes Relative: 27 %
Lymphs Abs: 2.3 10*3/uL (ref 0.7–4.0)
MCH: 29.4 pg (ref 26.0–34.0)
MCHC: 34.2 g/dL (ref 30.0–36.0)
MCV: 86.1 fL (ref 80.0–100.0)
Monocytes Absolute: 0.7 10*3/uL (ref 0.1–1.0)
Monocytes Relative: 9 %
Neutro Abs: 5.2 10*3/uL (ref 1.7–7.7)
Neutrophils Relative %: 60 %
Platelets: 226 10*3/uL (ref 150–400)
RBC: 4.45 MIL/uL (ref 4.22–5.81)
RDW: 11.9 % (ref 11.5–15.5)
WBC: 8.6 10*3/uL (ref 4.0–10.5)
nRBC: 0 % (ref 0.0–0.2)

## 2022-05-20 NOTE — Addendum Note (Signed)
Addended by: Ardeen Garland on: 05/20/2022 01:27 PM   Modules accepted: Orders

## 2022-05-20 NOTE — Progress Notes (Signed)
[place] [phone]   [phone]  CONSULT NOTE  REFERRING PHYSICIAN: Alyssa [name], PA-C  REASON FOR CONSULTATION:  46 years old white male with heterozygous factor V Leiden.  HPI Bryan Hull is a 46 y.o. male with past medical history significant for diabetes mellitus, asthma, peripheral vascular disease as well as long history of smoking.  The patient also has a family history of heterozygous factor V Leiden in his mother.  He has been complaining of significant weight loss over the last year.  He had several investigation by his primary care provider including Cologuard that was negative.  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  The patient also has infrarenal abdominal aortic aneurysm measuring up to 2.6 cm.  He was tested for factor V Leiden before his vascular surgery and he was tested positive with heterozygous factor V Leiden.  After surgery the patient started treatment with Eliquis and he is currently on 5 mg p.o. twice daily since April 23, 2022.  He has been tolerating his treatment with Eliquis well with no concerning bleeding issues.  He was referred to me today for evaluation and recommendation regarding future anticoagulation. When seen today he continues to complain of the weight loss and he is currently smoking marijuana to help with the weight loss.  He has no chest pain, shortness of breath but has mild cough.  He has no hemoptysis.  He has no nausea, vomiting but occasional diarrhea with no constipation.  He has occasional headache and no visual changes. Family history significant for mother with heterozygous factor V Leiden and fibromyalgia.  Father had diabetes and dyslipidemia.  Maternal grandfather had  colon cancer and paternal aunt had Hodgkin lymphoma and ovarian cancer. The patient is married and has 2 children age 74.  He works as a Administrator.  He was accompanied today by his mother Bryan Hull.  He has a history of smoking 1 pack/day for around 31 years and quit on the day of his surgery on 04/22/2022.  He drinks alcohol occasionally and he also smokes marijuana.  HPI  Past Medical History:  Diagnosis Date   Asthma    as a child/teenager   Diabetes mellitus without complication ([place])    Family history of factor V Leiden mutation    Peripheral vascular disease ([place])    PAD    Past Surgical History:  Procedure Laterality Date   AORTA - BILATERAL FEMORAL ARTERY BYPASS GRAFT N/A 04/22/2022   Procedure: AORTOBIFEMORAL BYPASS GRAFT;  Surgeon: [name], MD;  Location: [facility] OR;  Service: Vascular;  Laterality: N/A;   APPENDECTOMY     WISDOM TOOTH EXTRACTION      Family History  Problem Relation Age of Onset   Diabetes Father    Hyperlipidemia Father     Social History Social History   Tobacco Use   Smoking status: Former    Packs/day: 0.25    Types: Cigarettes    Start date: 05/21/1993   Smokeless tobacco: Never  Vaping Use   Vaping Use: Some days  Substance Use Topics   Alcohol use: Yes    Comment: rare   Drug use: Yes    Types: Marijuana    No Known Allergies  Current Outpatient Medications  Medication  Sig Dispense Refill   apixaban (ELIQUIS) 2.5 MG TABS tablet Take 1 tablet (2.5 mg total) by mouth 2 (two) times daily. 60 tablet 0   aspirin EC 81 MG tablet Take 1 tablet (81 mg total) by mouth daily. Swallow whole. 30 tablet 0   atorvastatin (LIPITOR) 40 MG tablet Take 1 tablet (40 mg total) by mouth daily. 90 tablet 3   Blood Glucose Monitoring Suppl (FREESTYLE LITE) w/Device KIT Please use as directed to check sugar twice daily. (Patient not taking: Reported on 05/10/2022) 1 kit 2   CINNAMON PO Take 1 capsule by mouth daily. (Patient not taking: Reported on  05/10/2022)     FREESTYLE LITE test strip 2 (two) times daily. (Patient not taking: Reported on 05/10/2022)     Lancets (FREESTYLE) lancets SMARTSIG:Topical (Patient not taking: Reported on 05/10/2022)     lidocaine (LIDODERM) 5 % Place 1 patch onto the skin daily as needed (When walking). Remove & Discard patch within 12 hours or as directed by MD (Patient not taking: Reported on 05/03/2022)     oxyCODONE-acetaminophen (PERCOCET/ROXICET) 5-325 MG tablet Take 1 tablet by mouth every 6 (six) hours as needed for moderate pain. (Patient not taking: Reported on 05/10/2022) 10 tablet 0   pregabalin (LYRICA) 50 MG capsule Take 1 capsule (50 mg total) by mouth 2 (two) times daily. (Patient not taking: Reported on 05/10/2022) 60 capsule 0   varenicline (CHANTIX) 0.5 MG tablet Days 1 to 3: Oral: 0.5 mg once daily. Days 4 to 7: Oral: 0.5 mg twice daily (Patient not taking: Reported on 05/10/2022) 11 tablet 0   No current facility-administered medications for this visit.    Review of Systems  Constitutional: positive for weight loss Eyes: negative Ears, nose, mouth, throat, and face: negative Respiratory: negative Cardiovascular: negative Gastrointestinal: positive for nausea Genitourinary:negative Integument/breast: negative Hematologic/lymphatic: negative Musculoskeletal:negative Neurological: positive for dizziness Behavioral/Psych: negative Endocrine: negative Allergic/Immunologic: negative  Physical Exam  ZOX:WRUEA, healthy, no distress, well nourished, and well developed SKIN: skin color, texture, turgor are normal, no rashes or significant lesions HEAD: Normocephalic, No masses, lesions, tenderness or abnormalities EYES: normal, PERRLA, Conjunctiva are pink and non-injected EARS: External ears normal, Canals clear OROPHARYNX:no exudate, no erythema, and lips, buccal mucosa, and tongue normal  NECK: supple, no adenopathy, no JVD LYMPH:  no palpable lymphadenopathy, no  hepatosplenomegaly LUNGS: clear to auscultation , and palpation HEART: regular rate & rhythm, no murmurs, and no gallops ABDOMEN:abdomen soft, non-tender, normal bowel sounds, and no masses or organomegaly BACK: Back symmetric, no curvature., No CVA tenderness EXTREMITIES:no joint deformities, effusion, or inflammation, no edema  NEURO: alert & oriented x 3 with fluent speech, no focal motor/sensory deficits  PERFORMANCE STATUS: ECOG 1  LABORATORY DATA: Lab Results  Component Value Date   WBC 8.6 05/20/2022   HGB 13.1 05/20/2022   HCT 38.3 (L) 05/20/2022   MCV 86.1 05/20/2022   PLT 226 05/20/2022      Chemistry      Component Value Date/Time   NA 134 (L) 05/20/2022 1111   K 4.0 05/20/2022 1111   CL 101 05/20/2022 1111   CO2 29 05/20/2022 1111   BUN 10 05/20/2022 1111   CREATININE 0.95 05/20/2022 1111      Component Value Date/Time   CALCIUM 8.8 (L) 05/20/2022 1111   ALKPHOS 66 05/20/2022 1111   AST 12 (L) 05/20/2022 1111   ALT 15 05/20/2022 1111   BILITOT 1.0 05/20/2022 1111       RADIOGRAPHIC STUDIES: DG Chest  2 View  Result Date: 05/08/2022 CLINICAL DATA:  Unintentional weight loss. Recheck new left basilar opacity status post surgery. EXAM: CHEST - 2 VIEW COMPARISON:  Chest radiograph 04/23/2022 and earlier FINDINGS: Interval removal of enteric tube and right IJ central venous catheter. The heart size and mediastinal contours are within normal limits. Both lungs are clear with interval resolution of the left basilar opacity seen on 04/23/2022. No pleural effusion or pneumothorax. The visualized skeletal structures are unremarkable. IMPRESSION: No acute cardiopulmonary abnormality. Interval resolution of the left basilar opacity seen on chest radiograph 04/23/2022. Electronically Signed   By: Ileana Roup M.D.   On: 05/08/2022 14:09   DG Abdomen 1 View  Result Date: 04/24/2022 CLINICAL DATA:  NG tube placement. EXAM: ABDOMEN - 1 VIEW COMPARISON:  04/24/2022.  FINDINGS: The bowel gas pattern is normal. An NG tube terminates in the stomach. Surgical clips are present in the right lower quadrant. No radio-opaque calculi or other significant radiographic abnormality are seen. IMPRESSION: NG tube terminates in the stomach. Electronically Signed   By: Brett Fairy M.D.   On: 04/24/2022 23:32   DG Abd 1 View  Result Date: 04/24/2022 CLINICAL DATA:  NG tube position EXAM: ABDOMEN - 1 VIEW COMPARISON:  Abdominal radiograph dated September 25th 2023 FINDINGS: NG tube tip and side port project over the distal stomach. The bowel gas pattern is normal. Surgical clips noted in the right lower quadrant. Visualized lung bases demonstrate left basilar atelectasis. IMPRESSION: NG tube tip and side port project over the distal stomach, unchanged when compared with prior. Electronically Signed   By: Yetta Glassman M.D.   On: 04/24/2022 10:07   DG Chest Port 1 View  Result Date: 04/23/2022 CLINICAL DATA:  Sore chest and abdomen after vascular surgery. EXAM: PORTABLE CHEST 1 VIEW COMPARISON:  April 22, 2022 chest radiograph FINDINGS: Stable support apparatus. No pleural effusion. No pneumothorax. Unchanged cardiac and mediastinal contours. New left basilar and retrocardiac opacity. Visualized upper abdomen is unremarkable. No discernible osseous abnormality. IMPRESSION: New left basilar and retrocardiac opacity may represent atelectasis, but infection could have a similar appearance. Electronically Signed   By: Marin Roberts M.D.   On: 04/23/2022 09:13   DG Abd Portable 1V  Result Date: 04/22/2022 CLINICAL DATA:  Nasogastric tube placement EXAM: PORTABLE ABDOMEN - 1 VIEW COMPARISON:  Portable exam 1355 hours without priors for comparison FINDINGS: Nasogastric tube coiled in stomach with tip projecting over distal gastric antrum/pylorus. Nonobstructive bowel gas pattern. Surgical clips RIGHT mid abdomen. Osseous structures unremarkable. IMPRESSION: Tip of nasogastric tube  projects over distal gastric antrum/pylorus. Electronically Signed   By: Lavonia Dana M.D.   On: 04/22/2022 15:25   DG Chest Port 1 View  Result Date: 04/22/2022 CLINICAL DATA:  Nasogastric tube placement EXAM: PORTABLE CHEST 1 VIEW COMPARISON:  Portable exam 1354 hours compared 03/14/2022 FINDINGS: Nasogastric tube extends into stomach. RIGHT jugular line tip projects over SVC. Normal heart size, mediastinal contours, and pulmonary vascularity. Mild atelectasis versus infiltrate lower LEFT lung. Remaining lungs clear. No pleural effusion or pneumothorax. IMPRESSION: Atelectasis versus infiltrate in LEFT lower lung. Electronically Signed   By: Lavonia Dana M.D.   On: 04/22/2022 15:24    ASSESSMENT: This is a very pleasant 46 years old white male recently diagnosed with heterozygous factor V Leiden.  The patient also has history of peripheral vascular disease as well as progressive weight loss.   PLAN: I had a lengthy discussion with the patient and his mother today about his  current condition and recommendation regarding the management of patient with heterozygous factor V Leiden. I explained to the patient that the presence of heterozygous factor V Leiden increase his risk for blood clotting by 6-8 times the normal population with negative mutation.  I also explained to the patient that there is no need for lifelong anticoagulation unless he has recurrent deep venous thrombosis or blood clots. The patient had recent vascular surgery and he is currently on anticoagulation with Eliquis 5 mg p.o. twice daily.  I recommended for the patient to continue on this treatment for at least 2 more months and then he can switch to aspirin 81 mg p.o. daily after that. If the patient develop any deep venous thrombosis or other blood clotting in the future, he may need to resume his treatment with anticoagulation for life. Regarding the weight loss, he had extensive work-up by his primary care physician that was  unremarkable.  He may need to have upper endoscopy and colonoscopy to rule out gastrointestinal occult malignancy. I will see the patient on as needed basis at this point and he knows to call if he has any concerning issues.  The patient voices understanding of current disease status and treatment options and is in agreement with the current care plan.  All questions were answered. The patient knows to call the clinic with any problems, questions or concerns. We can certainly see the patient much sooner if necessary.  Thank you so much for allowing me to participate in the care of Bryan Hull. I will continue to follow up the patient with you and assist in his care. The total time spent in the appointment was 60 minutes.  Disclaimer: This note was dictated with voice recognition software. Similar sounding words can inadvertently be transcribed and may not be corrected upon review.   Eilleen Kempf May 20, 2022, 12:12 PM

## 2022-05-24 ENCOUNTER — Ambulatory Visit: Payer: 59 | Admitting: Physician Assistant

## 2022-05-31 ENCOUNTER — Encounter: Payer: Self-pay | Admitting: Physician Assistant

## 2022-05-31 ENCOUNTER — Ambulatory Visit (HOSPITAL_COMMUNITY)
Admission: RE | Admit: 2022-05-31 | Discharge: 2022-05-31 | Disposition: A | Discharge status: Home / Self Care | Payer: 59 | Source: Ambulatory Visit | Attending: Physician Assistant | Admitting: Physician Assistant

## 2022-05-31 ENCOUNTER — Ambulatory Visit (INDEPENDENT_AMBULATORY_CARE_PROVIDER_SITE_OTHER): Payer: 59 | Admitting: Physician Assistant

## 2022-05-31 VITALS — BP 108/72 | HR 79 | Temp 97.8°F | Ht 74.0 in | Wt 200.0 lb

## 2022-05-31 DIAGNOSIS — R630 Anorexia: Secondary | ICD-10-CM

## 2022-05-31 DIAGNOSIS — R634 Abnormal weight loss: Secondary | ICD-10-CM | POA: Insufficient documentation

## 2022-05-31 DIAGNOSIS — E1140 Type 2 diabetes mellitus with diabetic neuropathy, unspecified: Secondary | ICD-10-CM | POA: Diagnosis not present

## 2022-05-31 DIAGNOSIS — Z820 Family history of epilepsy and other diseases of the nervous system: Secondary | ICD-10-CM

## 2022-05-31 DIAGNOSIS — D6851 Activated protein C resistance: Secondary | ICD-10-CM

## 2022-05-31 DIAGNOSIS — G479 Sleep disorder, unspecified: Secondary | ICD-10-CM

## 2022-05-31 DIAGNOSIS — M6250 Muscle wasting and atrophy, not elsewhere classified, unspecified site: Secondary | ICD-10-CM | POA: Diagnosis not present

## 2022-05-31 LAB — POCT GLYCOSYLATED HEMOGLOBIN (HGB A1C): Hemoglobin A1C: 6 % — AB (ref 4.0–5.6)

## 2022-05-31 MED ORDER — FREESTYLE LITE TEST VI STRP: 1.0000 | ORAL_STRIP | Freq: Two times a day (BID) | 1 refills | Status: AC

## 2022-05-31 MED ORDER — MIRTAZAPINE 15 MG PO TABS: 15.0000 mg | ORAL_TABLET | Freq: Every day | ORAL | 0 refills | Status: DC

## 2022-05-31 MED ORDER — APIXABAN 2.5 MG PO TABS: 2.5000 mg | ORAL_TABLET | Freq: Two times a day (BID) | ORAL | 1 refills | Status: DC

## 2022-05-31 NOTE — Progress Notes (Signed)
Subjective:    Patient ID: Bryan Hull, male    DOB: 11-Jan-1976, 46 y.o.   MRN: [id number]  Chief Complaint  Patient presents with   Diabetes Management Plan    Pt in for 6 wk f/u recheck A1C and medication check/discussion; pt concerned with loss of sleep and no appetite and got worse since surgery. Pt waking up bout every 30 mins every night when trying to sleep gets aggravated so just gets up.  Pt admits the only way is really able to eat anything is because of smoking marijuana.  Test strips refilled, but needs refill on blood thinner Eliquis     HPI Patient is in today for follow-up.   Loss of appetite, smoking marijuana to try to increase appetite. Not having good bowel movements, describes them as round and hard and small.  Waking up every 30-40 minutes because of pain in hips, back, having to reposition. Feels like bone on bone grinding. Muscle wasting especially in his legs, not starting to spread diffusely. Paternal GF with Lou-Gehrig's.    Having occasional headaches. Occasional difficulty swallowing. Not complaining of pain in other joints, mostly just hips and back.   Recovering from his vascular surgery well.   271 lbs around this time last year.  Pant size 42 to 34 in the last year.   Past Medical History:  Diagnosis Date   Asthma    as a child/teenager   Diabetes mellitus without complication ([place])    Family history of factor V Leiden mutation    Peripheral vascular disease ([place])    PAD    Past Surgical History:  Procedure Laterality Date   AORTA - BILATERAL FEMORAL ARTERY BYPASS GRAFT N/A 04/22/2022   Procedure: AORTOBIFEMORAL BYPASS GRAFT;  Surgeon: [name], MD;  Location: [facility] OR;  Service: Vascular;  Laterality: N/A;   APPENDECTOMY     WISDOM TOOTH EXTRACTION      Family History  Problem Relation Age of Onset   Diabetes Father    Hyperlipidemia Father     Social History   Tobacco Use   Smoking status: Former    Packs/day: 0.25     Types: Cigarettes    Start date: 05/21/1993   Smokeless tobacco: Never  Vaping Use   Vaping Use: Some days  Substance Use Topics   Alcohol use: Yes    Comment: rare   Drug use: Yes    Types: Marijuana     No Known Allergies  Review of Systems NEGATIVE UNLESS OTHERWISE INDICATED IN HPI      Objective:     BP 108/72 (BP Location: Left Arm)   Pulse 79   Temp 97.8 F (36.6 C) (Temporal)   Ht '6\' 2"'$  (1.88 m)   Wt 200 lb (90.7 kg)   SpO2 97%   BMI 25.68 kg/m   Wt Readings from Last 3 Encounters:  05/31/22 200 lb (90.7 kg)  05/20/22 205 lb 1 oz (93 kg)  05/10/22 204 lb 1.6 oz (92.6 kg)    BP Readings from Last 3 Encounters:  05/31/22 108/72  05/20/22 126/73  05/10/22 124/88     Physical Exam Constitutional:      Appearance: Normal appearance. He is not ill-appearing.  Cardiovascular:     Rate and Rhythm: Normal rate and regular rhythm.     Pulses: Normal pulses.     Heart sounds: No murmur heard. Pulmonary:     Effort: Pulmonary effort is normal.     Breath sounds:  Normal breath sounds.  Abdominal:     General: Abdomen is flat.     Palpations: Abdomen is soft. There is no mass.     Tenderness: There is no abdominal tenderness.  Musculoskeletal:     Right lower leg: No edema.     Left lower leg: No edema.     Comments: Diffuse muscle wasting noted  Neurological:     General: No focal deficit present.     Mental Status: He is alert.  Psychiatric:        Mood and Affect: Mood normal.        Assessment & Plan:  Unintentional weight loss of more than 10% body weight within 6 months -     CT CHEST WO CONTRAST; Future -     Ambulatory referral to Gastroenterology -     Ambulatory referral to Neurology  Type 2 diabetes mellitus with diabetic neuropathy, without long-term current use of insulin ([place]) -     POCT glycosylated hemoglobin (Hb A1C)  Muscular atrophy, unspecified site -     Ambulatory referral to Gastroenterology -     Ambulatory referral to  Neurology  Family history of [name] disease -     Ambulatory referral to Neurology  Difficulty sleeping  Loss of appetite  Factor V Leiden ([place])  Other orders -     FreeStyle Lite Test; 1 each by Other route 2 (two) times daily.  Dispense: 200 each; Refill: 1 -     Apixaban; Take 1 tablet (2.5 mg total) by mouth 2 (two) times daily.  Dispense: 60 tablet; Refill: 1 -     Mirtazapine; Take 1 tablet (15 mg total) by mouth at bedtime.  Dispense: 30 tablet; Refill: 6   46 year old male with very concerning rapid unintentional weight loss.  Close to 30% of his body loss in the last 12 months (not reported in our system due to newer patient status, but patient reports being 271 pounds this time last year compared to 200 pounds today).  I diagnosed him with type 2 diabetes at his initial appointment with me February 14, 2022.  He has since been diet and exercise controlled and currently not taking any medications.  He is no longer smoking.  He recently underwent major vascular surgery and is recovering well from that.  We have done quite an extensive work-up on his continued weight loss.  He has not had a chest CT done and I will plan for stat imaging today to rule out any kind of underlying malignancy there.  He has not had endoscopy or colonoscopy, and I do think this would be pertinent for work-up as well.  He has not been able to provide a stool sample thus far for fecal occult blood testing.  He also tells me about his paternal grandfather's history of [name] and his mother is concerned because this is how his symptoms seem to start, I do think stat neurology work-up would be helpful as well.  New diagnosis with factor V Leiden and he has seen hematology and oncology.  Only has to take Eliquis for another 2 months, and then aspirin 81 mg after that.  They also recommended GI for complete cancer screening for age.  Patient knows that if he should suddenly worsen in symptoms, he will  present to the emergency department.  I will continue have close follow-up with him and continue to work towards a diagnosis and getting him feeling better.  For now, we can  try Remeron 15 mg at bedtime to help with appetite and sleep.     Return in about 26 days (around 06/26/2022) for recheck.  This note was prepared with assistance of Systems analyst. Occasional wrong-word or sound-a-like substitutions may have occurred due to the inherent limitations of voice recognition software.  Time Spent: 54 minutes of total time was spent on the date of the encounter performing the following actions: chart review prior to seeing the patient, obtaining history, performing a medically necessary exam, counseling on the treatment plan, placing orders, and documenting in our EHR.    Shigeru Lampert M Pietrina Jagodzinski, PA-C

## 2022-05-31 NOTE — Patient Instructions (Signed)
Present to [facility] at entrance C at 2:30 pm today for your 3 PM appointment CT chest. Call [phone] if needing to reschedule.  I have sent stat neurology and GI referrals.  Please contact me Monday or Tuesday if you do not hear about scheduling an appointment.  Try Remeron to help with sleep and appetite.  Present to the emergency department if any acute worsening symptoms.

## 2022-06-04 ENCOUNTER — Encounter: Payer: Self-pay | Admitting: Neurology

## 2022-06-04 ENCOUNTER — Ambulatory Visit (INDEPENDENT_AMBULATORY_CARE_PROVIDER_SITE_OTHER): Payer: 59 | Admitting: Neurology

## 2022-06-04 VITALS — BP 117/82 | HR 79 | Ht 74.0 in | Wt 200.5 lb

## 2022-06-04 DIAGNOSIS — R269 Unspecified abnormalities of gait and mobility: Secondary | ICD-10-CM | POA: Diagnosis not present

## 2022-06-04 DIAGNOSIS — R531 Weakness: Secondary | ICD-10-CM | POA: Insufficient documentation

## 2022-06-04 DIAGNOSIS — R202 Paresthesia of skin: Secondary | ICD-10-CM | POA: Diagnosis not present

## 2022-06-04 NOTE — Progress Notes (Unsigned)
Chief Complaint  Patient presents with   New Patient (Initial Visit)    Rm 14. Accompanied by mom. NP internal referral/30% loss of body weight in the last year, diffuse muscle wasting, family hx of lou gehrig's disease.      ASSESSMENT AND PLAN  Bryan Hull is a 46 y.o. male   With onset of bilateral lower extremity paresthesia, muscle weakness, poor appetite, mild unsteady gait, Low back pain radiating pain to the lateral lower extremity Significant peripheral vascular disease status post aortobifemoral bypass surgery  Newly diagnosed diabetes, A1c was 9.6 in August 2023, examination showed length-dependent sensory changes, distal weakness, mild bilateral hamstring muscle weakness, brisk upper and lower extremity and patella reflex,  EMG nerve conduction study for evaluation of peripheral neuropathy differentiation diagnoses also include lumbar radiculopathy  MRI of lumbar spine  Laboratory evaluations including CPK, thyroid functional test,    DIAGNOSTIC DATA (LABS, IMAGING, TESTING) - I reviewed patient records, labs, notes, testing and imaging myself where available.  MEDICAL HISTORY:  Bryan Hull is a 46 year old male, seen in request by his primary care PA [name], Alyssa for evaluation of rapid weight loss, muscle weakness, he is accompanied by his mother at today's visit June 04, 2022  I reviewed and summarized the referring note. PMHX. 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 Desma Maxim on April 23, 2022, he had aortoiliac occlusive disease, he tolerated the procedure well  He was bridged with IV heparin, eventually restarted on Eliquis  He reported significant improvement in his lower extremity vascular claudication, he can walk better,  He noticed gradual onset lack of appetite, weight loss since December 2022, about 70 pounds over the past 1 year  He has lack of appetite, he did experience some anxiety perisurgical period of time, was started on Remeron just 3 days ago, did help him sleep better,  His paternal grandfather died of [name] disease, at age 32, father suffered coronary vascular disease,  Patient always have some left leg difficulty, he contributed to his previous left lower extremity recurrent DVT, left leg tends to feel heavy, he denies bulbar weakness, no dysarthria, no double vision, sometimes felt his throat was closing on him, difficulty swallowing the food,  In addition he complains of intermittent headaches, sometimes preceding vision change, as if looking through a kaleidoscope, pounding headache with no significant light noise sensitivity  He was diagnosed with diabetes A1c of 9.6,  PHYSICAL EXAM:   Vitals:   06/04/22 1348  BP: 117/82  Pulse: 79  Weight: 200 lb 8 oz (90.9 kg)  Height: _0  (1.88 m)   Body mass index is 25.74 kg/m.  PHYSICAL EXAMNIATION:  Gen: NAD, conversant, well nourised, well groomed                     Cardiovascular: Regular rate rhythm, no peripheral edema, warm, nontender. Eyes: Conjunctivae clear without exudates or hemorrhage Neck: Supple, no carotid bruits. Pulmonary: Clear  to auscultation bilaterally   NEUROLOGICAL EXAM:  MENTAL STATUS: Speech/cognition: Anxious looking middle-age male awake, alert, oriented to history taking and casual conversation CRANIAL NERVES: CN II: Visual fields are full to  confrontation. Pupils are round equal and briskly reactive to light. CN III, IV, VI: extraocular movement are normal. No ptosis. CN V: Facial sensation is intact to light touch CN VII: Face is symmetric with normal eye closure  CN VIII: Hearing is normal to causal conversation. CN IX, X: Phonation is normal. CN XI: Head turning and shoulder shrug are intact  MOTOR: Upper extremity proximal and distal muscle strength is normal, bilateral lower extremity showed mild bilateral hamstring muscle weakness, moderate bilateral toe extension flexion weakness  REFLEXES: Reflexes are 2+ and symmetric at the biceps, triceps, knees, and trace at ankles. Plantar responses are flexor.  SENSORY: Length-dependent decreased light touch, pinprick vibratory sensation to ankle level  COORDINATION: There is no trunk or limb dysmetria noted.  GAIT/STANCE: Able to from seated position arm crossed, mildly limp, dragging left leg could not stand up on tiptoes and heels  REVIEW OF SYSTEMS:  Full 14 system review of systems performed and notable only for as above All other review of systems were negative.   ALLERGIES: No Known Allergies  HOME MEDICATIONS: Current Outpatient Medications  Medication Sig Dispense Refill   apixaban (ELIQUIS) 2.5 MG TABS tablet Take 1 tablet (2.5 mg total) by mouth 2 (two) times daily. 60 tablet 1   aspirin EC 81 MG tablet Take 1 tablet (81 mg total) by mouth daily. Swallow whole. 30 tablet 0   atorvastatin (LIPITOR) 40 MG tablet Take 1 tablet (40 mg total) by mouth daily. 90 tablet 3   Blood Glucose Monitoring Suppl (FREESTYLE LITE) w/Device KIT Please use as directed to check sugar twice daily. 1 kit 2   FREESTYLE LITE test strip 1 each by Other route 2 (two) times daily. 200 each 1   Lancets (FREESTYLE) lancets      mirtazapine (REMERON) 15 MG tablet Take 1 tablet (15 mg total) by mouth at bedtime. 30 tablet 0   No current facility-administered medications for this visit.     PAST MEDICAL HISTORY: Past Medical History:  Diagnosis Date   Asthma    as a child/teenager   Diabetes mellitus without complication ([place])    Family history of factor V Leiden mutation    Peripheral vascular disease ([place])    PAD    PAST SURGICAL HISTORY: Past Surgical History:  Procedure Laterality Date   AORTA - BILATERAL FEMORAL ARTERY BYPASS GRAFT N/A 04/22/2022   Procedure: AORTOBIFEMORAL BYPASS GRAFT;  Surgeon: [name], MD;  [facility];  Service: Vascular;  Laterality: N/A;   APPENDECTOMY     WISDOM TOOTH EXTRACTION      FAMILY HISTORY: Family History  Problem Relation Age of Onset   Diabetes Father    Hyperlipidemia Father     SOCIAL HISTORY: Social History   Socioeconomic History   Marital status: Married    Spouse name: Not on file   Number of children: 2   Years of education: Not on file   Highest education level: Not on file  Occupational History   Not on file  Tobacco Use   Smoking status: Former    Packs/day: 0.25    Types: Cigarettes    Start date: 05/21/1993   Smokeless tobacco: Never  Vaping Use   Vaping Use: Some days  Substance and Sexual Activity   Alcohol use:  Yes    Comment: rare   Drug use: Yes    Types: Marijuana   Sexual activity: Yes    Birth control/protection: None  Other Topics Concern   Not on file  Social History Narrative   Not on file   Social Determinants of Health   Financial Resource Strain: Not on file  Food Insecurity: Not on file  Transportation Needs: Not on file  Physical Activity: Not on file  Stress: Not on file  Social Connections: Not on file  Intimate Partner Violence: Not on file      Marcial Pacas, M.D. Ph.D.  [facility] [address] [place], [place] [address] Ph: [phone] Fax: [phone]  CC:  [name], Randa Evens, PA-C [place],  [place] [id number]  [name], Randa Evens, PA-C

## 2022-06-06 ENCOUNTER — Ambulatory Visit (INDEPENDENT_AMBULATORY_CARE_PROVIDER_SITE_OTHER): Payer: 59 | Admitting: Gastroenterology

## 2022-06-06 ENCOUNTER — Encounter: Payer: Self-pay | Admitting: Gastroenterology

## 2022-06-06 VITALS — BP 100/60 | HR 82 | Ht 74.0 in | Wt 202.0 lb

## 2022-06-06 DIAGNOSIS — R634 Abnormal weight loss: Secondary | ICD-10-CM | POA: Diagnosis not present

## 2022-06-06 DIAGNOSIS — Z23 Encounter for immunization: Secondary | ICD-10-CM

## 2022-06-06 DIAGNOSIS — R110 Nausea: Secondary | ICD-10-CM | POA: Diagnosis not present

## 2022-06-06 LAB — RPR: RPR Ser Ql: NONREACTIVE

## 2022-06-06 LAB — MULTIPLE MYELOMA PANEL, SERUM
Albumin SerPl Elph-Mcnc: 3.8 g/dL (ref 2.9–4.4)
Albumin/Glob SerPl: 1.2 (ref 0.7–1.7)
Alpha 1: 0.3 g/dL (ref 0.0–0.4)
Alpha2 Glob SerPl Elph-Mcnc: 1 g/dL (ref 0.4–1.0)
B-Globulin SerPl Elph-Mcnc: 1 g/dL (ref 0.7–1.3)
Gamma Glob SerPl Elph-Mcnc: 0.9 g/dL (ref 0.4–1.8)
Globulin, Total: 3.2 g/dL (ref 2.2–3.9)
IgA/Immunoglobulin A, Serum: 264 mg/dL (ref 90–386)
IgG (Immunoglobin G), Serum: 933 mg/dL (ref 603–1613)
IgM (Immunoglobulin M), Srm: 47 mg/dL (ref 20–172)
Total Protein: 7 g/dL (ref 6.0–8.5)

## 2022-06-06 LAB — VITAMIN B12: Vitamin B-12: 602 pg/mL (ref 232–1245)

## 2022-06-06 LAB — ANA W/REFLEX IF POSITIVE: Anti Nuclear Antibody (ANA): NEGATIVE

## 2022-06-06 LAB — FOLATE: Folate: 3.9 ng/mL (ref >3.0)

## 2022-06-06 LAB — HIV ANTIBODY (ROUTINE TESTING W REFLEX): HIV Screen 4th Generation wRfx: NONREACTIVE

## 2022-06-06 LAB — THYROID PANEL WITH TSH
Free Thyroxine Index: 2.3 (ref 1.2–4.9)
T3 Uptake Ratio: 26 % (ref 24–39)
T4, Total: 8.7 ug/dL (ref 4.5–12.0)
TSH: 1.42 u[IU]/mL (ref 0.450–4.500)

## 2022-06-06 LAB — C-REACTIVE PROTEIN: CRP: 4 mg/L (ref 0–10)

## 2022-06-06 LAB — SEDIMENTATION RATE: Sed Rate: 3 mm/hr (ref 0–15)

## 2022-06-06 LAB — CK: Total CK: 41 U/L — ABNORMAL LOW (ref 49–439)

## 2022-06-06 MED ORDER — NA SULFATE-K SULFATE-MG SULF 17.5-3.13-1.6 GM/177ML PO SOLN: 1.0000 | Freq: Once | ORAL | 0 refills | Status: AC

## 2022-06-06 NOTE — Progress Notes (Signed)
[facility] Consult Note:  History: Bryan Hull 06/06/2022  Referring provider: [name], Randa Evens, PA-C  Reason for consult/chief complaint: Weight Loss (Down 72 lbs since last Christmas, occ nausea)   Subjective  HPI:  Bryan Hull is a very pleasant 46 year old man accompanied by his mother and referred by primary care for substantial weight loss over the last year.  His appetite is variable, sometimes he only wants a little food or he has lost taste for it.  He denies dysphagia, odynophagia or vomiting.  Occasional nausea.  He denies abdominal pain.  Because his appetite is decreased, he feels this is why he might only have a bowel movement every other day.  Denies rectal bleeding.  Elective aortobifemoral bypass with right CFA endarterectomy 04/23/2022.  Performed due to aortoiliac occlusive disease.  He was discovered to have a factor V Leiden heterozygous mutation and was discharged on Eliquis. Primary care saw him 05/03/2022, it was reported unintentional weight loss and fluctuations in his glucose.  Seen by primary care again 05/31/2022 with musculoskeletal pain, poor appetite improved by smoking marijuana.  Constipation also noted.  Bryan Hull is very concerned that he has lost approximately 70 pounds since last Christmas.  He has noticed a substantial loss of muscle mass, which makes him feel generally weak.  He saw neurology and was told they thought it probably was not ALS, some additional testing is planned. ROS:  Review of Systems  Constitutional:  Positive for fatigue. Negative for appetite change and unexpected weight change.  HENT:  Negative for mouth sores and voice change.   Eyes:  Negative for pain and redness.  Respiratory:  Negative for cough and shortness of breath.   Cardiovascular:  Negative for chest pain and palpitations.  Genitourinary:  Negative for dysuria and hematuria.  Musculoskeletal:  Negative for arthralgias and myalgias.  Skin:  Negative  for pallor and rash.  Neurological:  Negative for weakness and headaches.  Hematological:  Negative for adenopathy.     Past Medical History: Past Medical History:  Diagnosis Date   Asthma    as a child/teenager   Diabetes mellitus without complication ([place])    Factor V Leiden ([place])    Family history of factor V Leiden mutation    Peripheral vascular disease ([place])    PAD   Excerpt from PCP note 05/31/2022:  "46 year old male with very concerning rapid unintentional weight loss.  Close to 30% of his body loss in the last 12 months (not reported in our system due to newer patient status, but patient reports being 271 pounds this time last year compared to 200 pounds today).  I diagnosed him with type 2 diabetes at his initial appointment with me February 14, 2022.  He has since been diet and exercise controlled and currently not taking any medications.  He is no longer smoking.  He recently underwent major vascular surgery and is recovering well from that.   We have done quite an extensive work-up on his continued weight loss.  He has not had a chest CT done and I will plan for stat imaging today to rule out any kind of underlying malignancy there.  He has not had endoscopy or colonoscopy, and I do think this would be pertinent for work-up as well.  He has not been able to provide a stool sample thus far for fecal occult blood testing.  He also tells me about his paternal grandfather's history of [name] and his mother is concerned because this  is how his symptoms seem to start, I do think stat neurology work-up would be helpful as well.   New diagnosis with factor V Leiden and he has seen hematology and oncology.  Only has to take Eliquis for another 2 months, and then aspirin 81 mg after that.  They also recommended GI for complete cancer screening for age." _________________  Hematology consult note of 05/20/2022 reviewed.  It recommends no more than 2 months of Eliquis after the recent  surgery, and then lifelong aspirin.   Past Surgical History: Past Surgical History:  Procedure Laterality Date   AORTA - BILATERAL FEMORAL ARTERY BYPASS GRAFT N/A 04/22/2022   Procedure: AORTOBIFEMORAL BYPASS GRAFT;  Surgeon: [name], MD;  Location: [facility] OR;  Service: Vascular;  Laterality: N/A;   APPENDECTOMY     WISDOM TOOTH EXTRACTION       Family History: Family History  Problem Relation Age of Onset   Factor V Leiden deficiency Mother    Fibromyalgia Mother    Diabetes Father    Hyperlipidemia Father    Heart attack Father        threw a blood clot when kidney's shut down   Breast cancer Maternal Aunt    Non-Hodgkin's lymphoma Paternal Aunt    Ovarian cancer Paternal Aunt    Stomach cancer Paternal Aunt    Colon cancer Neg Hx    Esophageal cancer Neg Hx    Rectal cancer Neg Hx    A family member has ALS  Social History: Social History   Socioeconomic History   Marital status: Married    Spouse name: Not on file   Number of children: 2   Years of education: Not on file   Highest education level: Not on file  Occupational History   Not on file  Tobacco Use   Smoking status: Former    Packs/day: 0.25    Types: Cigarettes    Start date: 05/21/1993   Smokeless tobacco: Never  Vaping Use   Vaping Use: Some days   Substances: THC  Substance and Sexual Activity   Alcohol use: Yes    Comment: rare   Drug use: Yes    Types: Marijuana   Sexual activity: Yes    Birth control/protection: None  Other Topics Concern   Not on file  Social History Narrative   Not on file   Social Determinants of Health   Financial Resource Strain: Not on file  Food Insecurity: Not on file  Transportation Needs: Not on file  Physical Activity: Not on file  Stress: Not on file  Social Connections: Not on file    Allergies: No Known Allergies  Outpatient Meds: Current Outpatient Medications  Medication Sig Dispense Refill   apixaban (ELIQUIS) 2.5 MG TABS tablet  Take 1 tablet (2.5 mg total) by mouth 2 (two) times daily. 60 tablet 1   aspirin EC 81 MG tablet Take 1 tablet (81 mg total) by mouth daily. Swallow whole. 30 tablet 0   atorvastatin (LIPITOR) 40 MG tablet Take 1 tablet (40 mg total) by mouth daily. 90 tablet 3   Blood Glucose Monitoring Suppl (FREESTYLE LITE) w/Device KIT Please use as directed to check sugar twice daily. 1 kit 2   FREESTYLE LITE test strip 1 each by Other route 2 (two) times daily. 200 each 1   Lancets (FREESTYLE) lancets      mirtazapine (REMERON) 15 MG tablet Take 1 tablet (15 mg total) by mouth at bedtime. 30 tablet 0  Na Sulfate-K Sulfate-Mg Sulf 17.5-3.13-1.6 GM/177ML SOLN Take 1 kit by mouth once for 1 dose. 354 mL 0   No current facility-administered medications for this visit.      ___________________________________________________________________ Objective   Exam:  BP 100/60   Pulse 82   Ht _0  (1.88 m)   Wt 202 lb (91.6 kg)   BMI 25.94 kg/m  Wt Readings from Last 3 Encounters:  06/06/22 202 lb (91.6 kg)  06/04/22 200 lb 8 oz (90.9 kg)  05/31/22 200 lb (90.7 kg)    General: Pleasant and well-appearing. Eyes: sclera anicteric, no redness ENT: oral mucosa moist without lesions, no cervical or supraclavicular lymphadenopathy CV: Regular without murmur, no JVD, no peripheral edema.  Good distal pulses Resp: clear to auscultation bilaterally, normal RR and effort noted GI: soft, no tenderness, with active bowel sounds. No guarding or palpable organomegaly noted. Skin; warm and dry, no rash or jaundice noted Neuro: awake, alert and oriented x 3. Normal gross motor function and fluent speech  Labs:     Latest Ref Rng & Units 06/04/2022    2:53 PM 05/20/2022   11:11 AM 05/03/2022   11:14 AM  CMP  Glucose 70 - 99 mg/dL  182    BUN 6 - 20 mg/dL  10    Creatinine 0.61 - 1.24 mg/dL  0.95    Sodium 135 - 145 mmol/L  134    Potassium 3.5 - 5.1 mmol/L  4.0    Chloride 98 - 111 mmol/L  101    CO2 22  - 32 mmol/L  29    Calcium 8.9 - 10.3 mg/dL  8.8  10.0   Total Protein 6.0 - 8.5 g/dL 7.0  6.8    Total Bilirubin 0.3 - 1.2 mg/dL  1.0    Alkaline Phos 38 - 126 U/L  66    AST 15 - 41 U/L  12    ALT 0 - 44 U/L  15        Latest Ref Rng & Units 05/20/2022   11:11 AM 05/03/2022   11:14 AM 04/27/2022    1:39 AM  CBC  WBC 4.0 - 10.5 K/uL 8.6  11.1  7.3   Hemoglobin 13.0 - 17.0 g/dL 13.1  14.1  10.4   Hematocrit 39.0 - 52.0 % 38.3  41.9  29.8   Platelets 150 - 400 K/uL 226  352.0  149    Hgb A1c   9.6  at time of DM Dx on 02/14/22  Nml TSH and Free T4  Negative HIV Ab  Radiologic Studies:  CLINICAL DATA:  Peripheral arterial disease   Left lower extremity claudication difficulty walking with left lower extremity numbness for 2 weeks   EXAM: CT ANGIOGRAPHY OF ABDOMINAL AORTA WITH ILIOFEMORAL RUNOFF   TECHNIQUE: Multidetector CT imaging of the abdomen, pelvis and lower extremities was performed using the standard protocol during bolus administration of intravenous contrast. Multiplanar CT image reconstructions and MIPs were obtained to evaluate the vascular anatomy.   RADIATION DOSE REDUCTION: This exam was performed according to the departmental dose-optimization program which includes automated exposure control, adjustment of the mA and/or kV according to patient size and/or use of iterative reconstruction technique.   CONTRAST:  163m OMNIPAQUE IOHEXOL 350 MG/ML SOLN   COMPARISON:  None available   FINDINGS: VASCULAR   Aorta: Mild ectasia of the infrarenal abdominal aorta measuring up to 2.6 cm. Eccentric low-density material along the left lateral and posterior wall of the aorta may  be related to prior thrombosed dissection.   Celiac: Patent without evidence of aneurysm, dissection, vasculitis or significant stenosis.   SMA: Patent without evidence of aneurysm, dissection, vasculitis or significant stenosis.   Renals: Both renal arteries are patent without  evidence of aneurysm, dissection, vasculitis, fibromuscular dysplasia or significant stenosis.   IMA: Patent.   RIGHT Lower Extremity   Inflow: Calcified atheromatous plaque seen throughout the common iliac artery with mild to moderate stenosis of the distal segment. There is mild ectasia of the distal common iliac artery measuring up to 1.2 cm. Mild to moderate stenosis at the origin of the right internal iliac artery. No significant narrowing of the right external iliac artery.   Outflow: No significant stenosis of the right common femoral, profundus femoris, superficial femoral, popliteal arteries.   Runoff: Tibioperoneal trunk is patent. Two vessel runoff to the right ankle through the anterior tibial and peroneal arteries. Posterior tibial artery is occluded.   LEFT Lower Extremity   Inflow: Common and external iliac arteries are occluded. Proximal internal iliac artery is occluded. Opacification of distal internal iliac branches consistent with retrograde collateral flow.   Outflow: Flow reconstitutes in the left common femoral artery through prominent deep circumflex iliac and inferior epigastric branches. No significant stenosis of the left common femoral, profundus femoris, superficial femoral, popliteal arteries. Minimal calcified atheromatous plaque is noted in the mid popliteal artery.   Runoff: Tibioperoneal trunk is patent. Two vessel runoff to the left ankle through the anterior and posterior tibial arteries. There is discontinuous opacification of the peroneal artery.   Veins: No obvious venous abnormality within the limitations of this arterial phase study.   Review of the MIP images confirms the above findings.   NON-VASCULAR   Lower chest: No acute abnormality.   Hepatobiliary: No focal liver abnormality is seen. No gallstones, gallbladder wall thickening, or biliary dilatation.   Pancreas: Unremarkable. No pancreatic ductal dilatation  or surrounding inflammatory changes.   Spleen: Normal in size without focal abnormality.   Adrenals/Urinary Tract: Adrenal glands are unremarkable. Kidneys are normal, without renal calculi, focal lesion, or hydronephrosis. Bladder is unremarkable.   Stomach/Bowel: Stomach is within normal limits. Appendix surgically absent. No evidence of bowel wall thickening, distention, or inflammatory changes.   Lymphatic: No enlarged abdominal or pelvic lymph nodes.   Reproductive: Prostate is unremarkable.   Other: No abdominal wall hernia or abnormality. No abdominopelvic ascites.   Musculoskeletal: No acute or significant osseous findings.   IMPRESSION: VASCULAR   1. 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 Proximal left internal iliac artery is occluded, however there is opacification of distal branches consistent with retrograde collateral flow. Flow reconstitutes within the left common femoral artery. 2. Infrarenal abdominal aortic aneurysm measuring up to 2.6 cm. Low-density material along the wall of the dilated segment may be related to intramural thrombus or thrombosed dissection flap. Recommend follow-up every 5 years. This recommendation follows ACR consensus guidelines: White Paper of the ACR Incidental Findings Committee II on Vascular Findings. J Am Coll Radiol 2013; 10:789-794.   NON-VASCULAR     Electronically Signed   By: Miachel Roux M.D.   On: 03/27/2022 11:24   ________________________________  Narrative & Impression  CLINICAL DATA:  Unintended 71 lb weight loss over the past 12 months. History of smoking.   EXAM: CT CHEST WITHOUT CONTRAST   TECHNIQUE: Multidetector CT imaging of the chest was performed following the standard protocol without IV contrast.   RADIATION DOSE REDUCTION: This exam  was performed according to the departmental dose-optimization program which includes automated exposure control, adjustment of the mA  and/or kV according to patient size and/or use of iterative reconstruction technique.   COMPARISON:  Chest radiographs dated 05/08/2022. Abdomen and pelvis CTA dated 03/26/2022.   FINDINGS: Cardiovascular: Minimal pericardial effusion measuring 4 mm in thickness. Normal sized heart. Minimal aortic calcification.   Mediastinum/Nodes: Small hiatal hernia or prominent esophageal ampulla. Enlarged lymph nodes. Unremarkable thyroid gland and esophagus.   Lungs/Pleura: Lungs are clear. No pleural effusion or pneumothorax.   Upper Abdomen: Unremarkable.   Musculoskeletal: Minimal thoracic spine degenerative changes. Mildly elevated left hemidiaphragm.   IMPRESSION: 1. No acute abnormality. 2. Minimal pericardial effusion. 3. Small hiatal hernia or prominent esophageal ampulla. 4. Minimal aortic atherosclerosis.   Aortic Atherosclerosis (ICD10-I70.0).     Electronically Signed   By: Claudie Revering M.D.   On: 05/31/2022 15:05      Assessment: Encounter Diagnoses  Name Primary?   Weight loss Yes   Nausea in adult     Substantial weight loss of unknown cause.  It sounds like at least some of it was initially attributed to diabetes prior to his diagnosis, but his weight loss has persisted.  He is also quite clear that it is loss of muscle mass.  His only localizing GI symptom is some intermittent nausea, and he does not have diarrhea to suggest a malabsorptive condition.  There is also no clear abnormality on chest and abdominal imaging.  Probable small hiatal hernia, no dysphagia.  Plan:  We have scheduled him for an upper endoscopy and a colonoscopy several weeks from now.  This will get him to the 40-monthmark from [facility].  He should be able to stop this medicine 2 days prior to procedure, and probably indefinitely at that point as long as his hematologist is agreeable.  Per their recent consult note, that seems a likely plan.  They will be  contacted in my note sent to them as well for review.  BRachitwas agreeable to an EGD and colonoscopy after thorough discussion of procedure and risks.  The benefits and risks of the planned procedure were described in detail with the patient or (when appropriate) their health care proxy.  Risks were outlined as including, but not limited to, bleeding, infection, perforation, adverse medication reaction leading to cardiac or pulmonary decompensation, pancreatitis (if ERCP).  The limitation of incomplete mucosal visualization was also discussed.  No guarantees or warranties were given.  If these procedures are unrevealing for source of the weight loss, then there would seem to be some other as yet unidentified condition causing that.  Thank you for the courtesy of this consult.  Please call me with any questions or concerns.  HNelida MeuseIII  CC: Referring provider noted above

## 2022-06-06 NOTE — Patient Instructions (Signed)
_______________________________________________________  If you are age 47 or older, your body mass index should be between 23-30. Your Body mass index is 25.94 kg/m. If this is out of the aforementioned range listed, please consider follow up with your Primary Care Provider.  If you are age 73 or younger, your body mass index should be between 19-25. Your Body mass index is 25.94 kg/m. If this is out of the aformentioned range listed, please consider follow up with your Primary Care Provider.   ________________________________________________________  The [facility] providers would like to encourage you to use [facility] to communicate with providers for non-urgent requests or questions.  Due to long hold times on the telephone, sending your provider a message by [facility] may be a faster and more efficient way to get a response.  Please allow 48 business hours for a response.  Please remember that this is for non-urgent requests.  _______________________________________________________  Bryan Hull have been scheduled for an endoscopy and colonoscopy. Please follow the written instructions given to you at your visit today. Please pick up your prep supplies at the pharmacy within the next 1-3 days. If you use inhalers (even only as needed), please bring them with you on the day of your procedure.    You will be contacted by our office prior to your procedure for directions on holding your ELIQUIS.  If you do not hear from our office 1 week prior to your scheduled procedure, please call [phone] to discuss.    Due to recent changes in healthcare laws, you may see the results of your imaging and laboratory studies on MyChart before your provider has had a chance to review them.  We understand that in some cases there may be results that are confusing or concerning to you. Not all laboratory results come back in the same time frame and the provider may be waiting for multiple results in order to interpret  others.  Please give [place] 48 hours in order for your provider to thoroughly review all the results before contacting the office for clarification of your results.    It was a pleasure to see you today!  Thank you for trusting me with your gastrointestinal care!

## 2022-06-26 ENCOUNTER — Encounter: Payer: Self-pay | Admitting: Physician Assistant

## 2022-06-26 ENCOUNTER — Ambulatory Visit (INDEPENDENT_AMBULATORY_CARE_PROVIDER_SITE_OTHER): Payer: 59 | Admitting: Physician Assistant

## 2022-06-26 VITALS — BP 112/72 | HR 81 | Temp 97.8°F | Ht 74.0 in | Wt 206.2 lb

## 2022-06-26 DIAGNOSIS — I739 Peripheral vascular disease, unspecified: Secondary | ICD-10-CM

## 2022-06-26 DIAGNOSIS — Z23 Encounter for immunization: Secondary | ICD-10-CM

## 2022-06-26 DIAGNOSIS — R630 Anorexia: Secondary | ICD-10-CM | POA: Diagnosis not present

## 2022-06-26 DIAGNOSIS — G479 Sleep disorder, unspecified: Secondary | ICD-10-CM

## 2022-06-26 DIAGNOSIS — I7409 Other arterial embolism and thrombosis of abdominal aorta: Secondary | ICD-10-CM

## 2022-06-26 DIAGNOSIS — R634 Abnormal weight loss: Secondary | ICD-10-CM | POA: Diagnosis not present

## 2022-06-26 MED ORDER — MIRTAZAPINE 15 MG PO TABS: 15.0000 mg | ORAL_TABLET | Freq: Every day | ORAL | 0 refills | Status: DC

## 2022-06-26 MED ORDER — ATORVASTATIN CALCIUM 40 MG PO TABS: 40.0000 mg | ORAL_TABLET | Freq: Every day | ORAL | 3 refills | Status: DC

## 2022-06-26 NOTE — Progress Notes (Signed)
Subjective:    Patient ID: Bryan Hull, male    DOB: [birth year], 46 y.o.   MRN: [id number]  Chief Complaint  Patient presents with   Follow-up    Pt in for f/u w/ PCP for recheck; no longer taking Eliquis due to cost but Remeron is working well and wants to continue to take. Pt is gaining weight since last visit, pt is sleeping well and appetite is doing great.     HPI Patient is in today for follow-up about unintentional weight loss, trouble sleeping, loss of appetite. Taking Remeron 15 mg daily and says it has been helping a lot, even gained weight.   Endoscopy / colonoscopy scheduled for 07/15/22. Nerve conduction scheduled for 09/11/22.  Goes back to work this Friday. Has been riding a motorcycle again. Feeling better overall. No new concerns.   Past Medical History:  Diagnosis Date   Asthma    as a child/teenager   Diabetes mellitus without complication ([place])    Factor V Leiden ([place])    Family history of factor V Leiden mutation    Peripheral vascular disease ([place])    PAD    Past Surgical History:  Procedure Laterality Date   AORTA - BILATERAL FEMORAL ARTERY BYPASS GRAFT N/A 04/22/2022   Procedure: AORTOBIFEMORAL BYPASS GRAFT;  Surgeon: Bryan John, MD;  Location: [facility] OR;  Service: Vascular;  Laterality: N/A;   APPENDECTOMY     WISDOM TOOTH EXTRACTION      Family History  Problem Relation Age of Onset   Factor V Leiden deficiency Mother    Fibromyalgia Mother    Diabetes Father    Hyperlipidemia Father    Heart attack Father        threw a blood clot when kidney's shut down   Breast cancer Maternal Aunt    Non-Hodgkin's lymphoma Paternal Aunt    Ovarian cancer Paternal Aunt    Stomach cancer Paternal Aunt    Colon cancer Neg Hx    Esophageal cancer Neg Hx    Rectal cancer Neg Hx     Social History   Tobacco Use   Smoking status: Former    Packs/day: 0.25    Types: Cigarettes    Start date: 05/21/1993   Smokeless tobacco: Never  Vaping Use    Vaping Use: Some days   Substances: THC  Substance Use Topics   Alcohol use: Yes    Comment: rare   Drug use: Yes    Types: Marijuana     No Known Allergies  Review of Systems NEGATIVE UNLESS OTHERWISE INDICATED IN HPI      Objective:     BP 112/72 (BP Location: Left Arm)   Pulse 81   Temp 97.8 F (36.6 C) (Temporal)   Ht '6\' 2"'$  (1.88 m)   Wt 206 lb 3.2 oz (93.5 kg)   SpO2 98%   BMI 26.47 kg/m   Wt Readings from Last 3 Encounters:  06/26/22 206 lb 3.2 oz (93.5 kg)  06/06/22 202 lb (91.6 kg)  06/04/22 200 lb 8 oz (90.9 kg)    BP Readings from Last 3 Encounters:  06/26/22 112/72  06/06/22 100/60  06/04/22 117/82     Physical Exam Constitutional:      Appearance: Normal appearance.  Cardiovascular:     Rate and Rhythm: Normal rate and regular rhythm.     Pulses: Normal pulses.     Heart sounds: No murmur heard. Pulmonary:     Effort: Pulmonary effort is  normal.     Breath sounds: Normal breath sounds.  Neurological:     General: No focal deficit present.     Mental Status: He is alert and oriented to person, place, and time.  Psychiatric:        Mood and Affect: Mood normal.        Behavior: Behavior normal.        Assessment & Plan:  Unintentional weight loss of more than 10% body weight within 6 months  Difficulty sleeping  Loss of appetite  Need for immunization against influenza -     Flu Vaccine QUAD 68moIM (Fluarix, Fluzone & Alfiuria Quad PF)  PAD (peripheral artery disease) (HCC)  Aortoiliac occlusive disease (HCC)  Other orders -     Mirtazapine; Take 1 tablet (15 mg total) by mouth at bedtime.  Dispense: 90 tablet; Refill: 0 -     Atorvastatin Calcium; Take 1 tablet (40 mg total) by mouth daily.  Dispense: 90 tablet; Refill: 3    Extensive workup as noted in my last note for unintentional weight loss.  Today he reports that he is feeling better.  He has been taking mirtazapine 15 mg at bedtime and sleeping well and eating much  better.  He will be going for colonoscopy and endoscopy next month.  He is also scheduled for a nerve conduction test in February.  Otherwise, he feels like he is making progress and even gained 4 pounds since last visit.  I did ask him today about possible depression in the last year and that he was not really sure.  He lost his father in 2020 and said he will always grieve him.  He lost his grandmother in 2021 and that was difficult as well.  No feelings of depression or anxiety today, but we can continue to monitor.  Happy to hear he will be going back to work. I also patient to continue on his cholesterol medicine and sent a refill to his pharmacy.  He will continue to work on healthy lifestyle habits and call back sooner if any concerns come up.    Return in about 2 months (around 08/26/2022) for recheck.  This note was prepared with assistance of [employer] analyst Occasional wrong-word or sound-a-like substitutions may have occurred due to the inherent limitations of voice recognition software.     Bryan Hull M Bryan Keeven, PA-C

## 2022-07-08 ENCOUNTER — Telehealth: Payer: Self-pay | Admitting: Neurology

## 2022-07-08 DIAGNOSIS — R531 Weakness: Secondary | ICD-10-CM

## 2022-07-08 DIAGNOSIS — R202 Paresthesia of skin: Secondary | ICD-10-CM

## 2022-07-08 NOTE — Telephone Encounter (Signed)
Orders Placed This Encounter  Procedures   DG Lumbar Spine 2-3 Views   Please asked patient to complete x-ray of lumbar spine at [facility], this might facilitate MRI of lumbar prior authorization by his insurance company

## 2022-07-08 NOTE — Telephone Encounter (Signed)
[employer] denied MRI lumbar spine on 12/6. Order #[id number]  Clinical Rationale:  Your doctor told us that you have abnormal reflexes. Your doctor ordered an MRI of your lower back. An MRI is a way to take pictures of the inside of your body. This test should be used if it is likely to result in a specific change in your treatment. Such a change might be related to the need for surgery or a procedure. We reviewed the notes we have. The notes do not show that you are going to have surgery or a procedure. Based on the information we have, this test is not medically necessary for you. We used [employer] Medical Benefits Management Clinical Guideline titled Imaging of the Spine to make this decision. You may view this guideline at www.[email or web address].com/mbm-guidelines-radiology.  A reconsideration may be requested within seven business days of adverse determination. To request a reconsideration, please call [employer] at [phone].

## 2022-07-08 NOTE — Addendum Note (Signed)
Addended by: Marcial Pacas on: 07/08/2022 04:52 PM   Modules accepted: Orders

## 2022-07-09 NOTE — Telephone Encounter (Signed)
I called pt and we discussed message. He is agreeable to X-RAY of lumbar spine. He will complete this as soon as possible. Mostly likely on a weekend due to work schedule.

## 2022-07-14 ENCOUNTER — Encounter: Payer: Self-pay | Admitting: Certified Registered Nurse Anesthetist

## 2022-07-15 ENCOUNTER — Encounter: Payer: Self-pay | Admitting: Gastroenterology

## 2022-07-15 ENCOUNTER — Ambulatory Visit (AMBULATORY_SURGERY_CENTER): Payer: 59 | Admitting: Gastroenterology

## 2022-07-15 VITALS — BP 131/86 | HR 62 | Temp 97.1°F | Resp 12 | Ht 74.0 in | Wt 202.0 lb

## 2022-07-15 DIAGNOSIS — D120 Benign neoplasm of cecum: Secondary | ICD-10-CM | POA: Diagnosis not present

## 2022-07-15 DIAGNOSIS — R110 Nausea: Secondary | ICD-10-CM

## 2022-07-15 DIAGNOSIS — D122 Benign neoplasm of ascending colon: Secondary | ICD-10-CM

## 2022-07-15 DIAGNOSIS — K3189 Other diseases of stomach and duodenum: Secondary | ICD-10-CM | POA: Diagnosis not present

## 2022-07-15 DIAGNOSIS — K635 Polyp of colon: Secondary | ICD-10-CM | POA: Diagnosis not present

## 2022-07-15 DIAGNOSIS — R634 Abnormal weight loss: Secondary | ICD-10-CM

## 2022-07-15 MED ORDER — SODIUM CHLORIDE 0.9 % IV SOLN: 500.0000 mL | Freq: Once | INTRAVENOUS | Status: AC

## 2022-07-15 NOTE — Progress Notes (Signed)
Pt's states no medical or surgical changes since previsit or office visit. 

## 2022-07-15 NOTE — Progress Notes (Signed)
1412 Robinul 0.1 mg IV given due large amount of secretions upon assessment.  MD made aware, vss  

## 2022-07-15 NOTE — Patient Instructions (Signed)
Impression/Recommendations:  Polyp and hemorrhoid handouts given to patient.  Resume previous diet. Continue present medications. Await pathology results.  Repeat colonoscopy after studies are complete.  Better quality bowel prep needed for polyp detection and polypectomy.  YOU HAD AN ENDOSCOPIC PROCEDURE TODAY AT [facility]:   Refer to the procedure report that was given to you for any specific questions about what was found during the examination.  If the procedure report does not answer your questions, please call your gastroenterologist to clarify.  If you requested that your care partner not be given the details of your procedure findings, then the procedure report has been included in a sealed envelope for you to review at your convenience later.  YOU SHOULD EXPECT: Some feelings of bloating in the abdomen. Passage of more gas than usual.  Walking can help get rid of the air that was put into your GI tract during the procedure and reduce the bloating. If you had a lower endoscopy (such as a colonoscopy or flexible sigmoidoscopy) you may notice spotting of blood in your stool or on the toilet paper. If you underwent a bowel prep for your procedure, you may not have a normal bowel movement for a few days.  Please Note:  You might notice some irritation and congestion in your nose or some drainage.  This is from the oxygen used during your procedure.  There is no need for concern and it should clear up in a day or so.  SYMPTOMS TO REPORT IMMEDIATELY:  Following lower endoscopy (colonoscopy or flexible sigmoidoscopy):  Excessive amounts of blood in the stool  Significant tenderness or worsening of abdominal pains  Swelling of the abdomen that is new, acute  Fever of 100F or higher  Following upper endoscopy (EGD)  Vomiting of blood or coffee ground material  New chest pain or pain under the shoulder blades  Painful or persistently difficult swallowing  New shortness  of breath  Fever of 100F or higher  Black, tarry-looking stools  For urgent or emergent issues, a gastroenterologist can be reached at any hour by calling [phone]. Do not use MyChart messaging for urgent concerns.    DIET:  We do recommend a small meal at first, but then you may proceed to your regular diet.  Drink plenty of fluids but you should avoid alcoholic beverages for 24 hours.  ACTIVITY:  You should plan to take it easy for the rest of today and you should NOT DRIVE or use heavy machinery until tomorrow (because of the sedation medicines used during the test).    FOLLOW UP: Our staff will call the number listed on your records the next business day following your procedure.  We will call around 7:15- 8:00 am to check on you and address any questions or concerns that you may have regarding the information given to you following your procedure. If we do not reach you, we will leave a message.     If any biopsies were taken you will be contacted by phone or by letter within the next 1-3 weeks.  Please call us at [phone] if you have not heard about the biopsies in 3 weeks.    SIGNATURES/CONFIDENTIALITY: You and/or your care partner have signed paperwork which will be entered into your electronic medical record.  These signatures attest to the fact that that the information above on your After Visit Summary has been reviewed and is understood.  Full responsibility of the confidentiality of this discharge  information lies with you and/or your care-partner. 

## 2022-07-15 NOTE — Op Note (Signed)
[place] Patient Name: Bryan Hull Procedure Date: 07/15/2022 2:14 PM MRN: [id number] Endoscopist: Mallie Mussel L. Loletha Carrow , MD, [id number] Age: 46 Referring MD:  Date of Birth: 06-13-1976 Gender: Male Account #: [id number] Procedure:                Colonoscopy Indications:              Weight loss                           clinical details in 06/06/22 office consult note                           no source on CT scan chest                           CTA abd/pelvis without source of weight loss.                            Significant PAD discovered requiring intervention Medicines:                Monitored Anesthesia Care Procedure:                Pre-Anesthesia Assessment:                           - Prior to the procedure, a History and Physical                            was performed, and patient medications and                            allergies were reviewed. The patient's tolerance of                            previous anesthesia was also reviewed. The risks                            and benefits of the procedure and the sedation                            options and risks were discussed with the patient.                            All questions were answered, and informed consent                            was obtained. Prior Anticoagulants: The patient has                            taken no anticoagulant or antiplatelet agents. ASA                            Grade Assessment: III - A patient with severe  systemic disease. After reviewing the risks and                            benefits, the patient was deemed in satisfactory                            condition to undergo the procedure.                           After obtaining informed consent, the colonoscope                            was passed under direct vision. Throughout the                            procedure, the patient's blood pressure, pulse, and                             oxygen saturations were monitored continuously. The                            CF [id number] #[id number] was introduced through the anus                            and advanced to the the cecum, identified by                            appendiceal orifice and ileocecal valve. The                            colonoscopy was performed with difficulty due to                            poor bowel prep, a redundant colon and significant                            looping. Successful completion of the procedure was                            aided by using manual pressure and straightening                            and shortening the scope to obtain bowel loop                            reduction. The patient tolerated the procedure                            well. The quality of the bowel preparation was                            poor. The ileocecal valve, appendiceal orifice, and  rectum were photographed. Scope In: 2:17:04 PM Scope Out: 2:35:05 PM Scope Withdrawal Time: 0 hours 14 minutes 24 seconds  Total Procedure Duration: 0 hours 18 minutes 1 second  Findings:                 The perianal and digital rectal examinations were                            normal.                           A large amount of stool was found in the entire                            colon.                           An 8-10 mm polyp was found in the cecum. The polyp                            was flat. The polyp was removed with a cold snare.                            Resection and retrieval were complete.                           A 25 mm polyp was found in the mid ascending colon.                            The polyp was sessile. Biopsies were taken with a                            cold forceps for histology.                           Internal hemorrhoids were found.                           The exam was otherwise without abnormality on                            direct and retroflexion  views. Complications:            No immediate complications. Estimated Blood Loss:     Estimated blood loss was minimal. Impression:               - Preparation of the colon was poor.                           - Stool in the entire examined colon.                           - One 8-10 mm polyp in the cecum, removed with a                            cold snare.  Resected and retrieved.                           - One 25 mm polyp in the mid ascending colon.                            Biopsied. This is not malignant-appearing.                           - Internal hemorrhoids.                           - The examination was otherwise normal on direct                            and retroflexion views.                           Notwithstanding limited visualization due to poor                            bowel prep, no findings to explain weight loss. Recommendation:           - Patient has a contact number available for                            emergencies. The signs and symptoms of potential                            delayed complications were discussed with the                            patient. Return to normal activities tomorrow.                            Written discharge instructions were provided to the                            patient.                           - Resume previous diet.                           - Continue present medications.                           - Await pathology results.                           - Repeat colonoscopy after studies are complete.                            Timing TBD - needs much better quality bowel                            preparation for polyp  detection and polypectomy.                           - See the other procedure note for documentation of                            additional recommendations. Paulino Cork L. Loletha Carrow, MD 07/15/2022 2:58:09 PM This report has been signed electronically.

## 2022-07-15 NOTE — Op Note (Signed)
[place] Patient Name: Bryan Hull Procedure Date: 07/15/2022 2:14 PM MRN: [id number] Endoscopist: Mallie Mussel L. Loletha Hull , MD, [id number] Age: 46 Referring MD:  Date of Birth: 03-Aug-1975 Gender: Male Account #: [id number] Procedure:                Upper GI endoscopy Indications:              Nausea, Weight loss Medicines:                Monitored Anesthesia Care Procedure:                Pre-Anesthesia Assessment:                           - Prior to the procedure, a History and Physical                            was performed, and patient medications and                            allergies were reviewed. The patient's tolerance of                            previous anesthesia was also reviewed. The risks                            and benefits of the procedure and the sedation                            options and risks were discussed with the patient.                            All questions were answered, and informed consent                            was obtained. Prior Anticoagulants: The patient has                            taken no anticoagulant or antiplatelet agents. ASA                            Grade Assessment: III - A patient with severe                            systemic disease. After reviewing the risks and                            benefits, the patient was deemed in satisfactory                            condition to undergo the procedure.                           After obtaining informed consent, the endoscope was  passed under direct vision. Throughout the                            procedure, the patient's blood pressure, pulse, and                            oxygen saturations were monitored continuously. The                            Endoscope was introduced through the mouth, and                            advanced to the second part of duodenum. The upper                            GI endoscopy was accomplished  without difficulty.                            The patient tolerated the procedure well. Scope In: Scope Out: Findings:                 The esophagus was normal.                           Normal mucosa was found in the entire examined                            stomach. Several biopsies were obtained in the                            gastric body and in the gastric antrum with cold                            forceps for histology.                           The cardia and gastric fundus were normal on                            retroflexion.                           Diffuse atrophic mucosa was found in the second                            portion of the duodenum. Biopsies for histology                            were taken with a cold forceps for evaluation of                            celiac disease.                           The exam of the duodenum was otherwise normal. Complications:  No immediate complications. Estimated Blood Loss:     Estimated blood loss was minimal. Impression:               - Normal esophagus.                           - Normal mucosa was found in the entire stomach.                           - Duodenal mucosal atrophy. Biopsied.                           - Several biopsies were obtained in the gastric                            body and in the gastric antrum. (to r/o H pylori)                           No visible source of weight loss seen on this exam. Recommendation:           - Patient has a contact number available for                            emergencies. The signs and symptoms of potential                            delayed complications were discussed with the                            patient. Return to normal activities tomorrow.                            Written discharge instructions were provided to the                            patient.                           - Resume previous diet.                           - Continue  present medications.                           - Await pathology results.                           - See the other procedure note for documentation of                            additional recommendations.                           - Return to primary care provider for further  monitoring and testing regarding weight loss. Bryan Starnes L. Loletha Carrow, MD 07/15/2022 3:02:04 PM This report has been signed electronically.

## 2022-07-15 NOTE — Progress Notes (Signed)
History and Physical:  This patient presents for endoscopic testing for: Encounter Diagnoses  Name Primary?   Weight loss Yes   Nausea in adult     46 year old man here today for nausea and profound weight loss.  No other localizing GI symptoms, additional clinical details in my office consult note of 06/06/2022.  This patient has also been evaluated by neurology, and he has had an aortobifemoral bypass graft for severe claudication several months ago.  His oral anticoagulation was also permanently discontinued since I last saw him.  Patient is otherwise without complaints or active issues today.   Past Medical History: Past Medical History:  Diagnosis Date   Asthma    as a child/teenager   Diabetes mellitus without complication ([place])    Factor V Leiden ([place])    Family history of factor V Leiden mutation    Peripheral vascular disease ([place])    PAD     Past Surgical History: Past Surgical History:  Procedure Laterality Date   AORTA - BILATERAL FEMORAL ARTERY BYPASS GRAFT N/A 04/22/2022   Procedure: AORTOBIFEMORAL BYPASS GRAFT;  Surgeon: [name], MD;  [facility]: [facility] OR;  Service: Vascular;  Laterality: N/A;   APPENDECTOMY     WISDOM TOOTH EXTRACTION      Allergies: No Known Allergies  Outpatient Meds: Current Outpatient Medications  Medication Sig Dispense Refill   aspirin EC 81 MG tablet Take 1 tablet (81 mg total) by mouth daily. Swallow whole. 30 tablet 0   atorvastatin (LIPITOR) 40 MG tablet Take 1 tablet (40 mg total) by mouth daily. 90 tablet 3   Blood Glucose Monitoring Suppl (FREESTYLE LITE) w/Device KIT Please use as directed to check sugar twice daily. 1 kit 2   FREESTYLE LITE test strip 1 each by Other route 2 (two) times daily. 200 each 1   Lancets (FREESTYLE) lancets      mirtazapine (REMERON) 15 MG tablet Take 1 tablet (15 mg total) by mouth at bedtime. 90 tablet 0   Current Facility-Administered Medications  Medication Dose Route Frequency Provider  Last Rate Last Admin   0.9 %  sodium chloride infusion  500 mL Intravenous Once Danis, Estill Cotta III, MD          ___________________________________________________________________ Objective   Exam:  BP 119/75   Pulse 68   Temp (!) 97.1 F (36.2 C) (Temporal)   Ht _0  (1.88 m)   Wt 202 lb (91.6 kg)   SpO2 96%   BMI 25.94 kg/m   CV: regular , S1/S2 Resp: clear to auscultation bilaterally, normal RR and effort noted GI: soft, no tenderness, with active bowel sounds.   Assessment: Encounter Diagnoses  Name Primary?   Weight loss Yes   Nausea in adult      Plan: Colonoscopy EGD   The patient is appropriate for an endoscopic procedure in the ambulatory setting.   - Wilfrid Lund, MD

## 2022-07-15 NOTE — Progress Notes (Signed)
Report given to PACU, vss 

## 2022-07-16 ENCOUNTER — Telehealth: Payer: Self-pay | Admitting: *Deleted

## 2022-07-16 NOTE — Telephone Encounter (Signed)
  Follow up Call-     07/15/2022    1:30 PM  Call back number  Post procedure Call Back phone  # [phone]  Permission to leave phone message Yes     Patient questions:  Do you have a fever, pain , or abdominal swelling? No. Pain Score  0 *  Have you tolerated food without any problems? Yes.    Have you been able to return to your normal activities? Yes.    Do you have any questions about your discharge instructions: Diet   No. Medications  No. Follow up visit  No.  Do you have questions or concerns about your Care? No.  Actions: * If pain score is 4 or above: No action needed, pain <4.

## 2022-07-19 ENCOUNTER — Other Ambulatory Visit: Payer: Self-pay

## 2022-07-19 ENCOUNTER — Ambulatory Visit: Payer: 59 | Attending: Internal Medicine | Admitting: Internal Medicine

## 2022-07-19 ENCOUNTER — Encounter: Payer: Self-pay | Admitting: Internal Medicine

## 2022-07-19 VITALS — BP 125/86 | HR 79 | Ht 74.0 in | Wt 205.0 lb

## 2022-07-19 DIAGNOSIS — D122 Benign neoplasm of ascending colon: Secondary | ICD-10-CM

## 2022-07-19 DIAGNOSIS — E782 Mixed hyperlipidemia: Secondary | ICD-10-CM | POA: Diagnosis not present

## 2022-07-19 DIAGNOSIS — E1140 Type 2 diabetes mellitus with diabetic neuropathy, unspecified: Secondary | ICD-10-CM | POA: Diagnosis not present

## 2022-07-19 DIAGNOSIS — I739 Peripheral vascular disease, unspecified: Secondary | ICD-10-CM | POA: Diagnosis not present

## 2022-07-19 DIAGNOSIS — D120 Benign neoplasm of cecum: Secondary | ICD-10-CM

## 2022-07-19 NOTE — Patient Instructions (Signed)
Medication Instructions:  Your physician recommends that you continue on your current medications as directed. Please refer to the Current Medication list given to you today.  *If you need a refill on your cardiac medications before your next appointment, please call your pharmacy*   Follow-Up: At [facility], you and your health needs are our priority.  As part of our continuing mission to provide you with exceptional heart care, we have created designated Provider Care Teams.  These Care Teams include your primary Cardiologist (physician) and Advanced Practice Providers (APPs -  Physician Assistants and Nurse Practitioners) who all work together to provide you with the care you need, when you need it.  We recommend signing up for the patient portal called "MyChart".  Sign up information is provided on this After Visit Summary.  MyChart is used to connect with patients for Virtual Visits (Telemedicine).  Patients are able to view lab/test results, encounter notes, upcoming appointments, etc.  Non-urgent messages can be sent to your provider as well.   To learn more about what you can do with MyChart, go to [email or web address].    Your next appointment:   We will see you on an as needed basis.   Provider:   K. Mali Hilty, MD

## 2022-07-19 NOTE — Progress Notes (Signed)
OFFICE CONSULT NOTE  Chief Complaint:  Preoperative evaluation  Primary Care Physician: [name], Randa Evens, PA-C  HPI:  Bryan Hull is a 46 y.o. male who is being seen today for the evaluation of preoperative risk at the request of [name], [name], PA-C.  This is a pleasant 46 year old male kindly referred for evaluation management of preoperative risk.  He was found to have peripheral arterial disease after complaints of leg pain and heaviness when walking.  The symptoms were obviously concerning for claudication and his PAD is significant, to the point where it is felt that he will likely need aortofemoral bypass.  He has been seeing Dr. Virl Cagey with vascular and vein specialists.  He apparently has a history of heart disease in his family including his father who had diabetes and an MI in grandfather who also had significant PAD.  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  He did have a recent diabetes diagnosis.  He was started on full dose aspirin and atorvastatin although his untreated lipids in August were quite low with total cholesterol 70, HDL 34, triglycerides 113 and LDL 13.  It is quite unusual with this lipid profile that he has such extensive PAD, which actually makes me concerned that he may have a genetic dyslipidemia such as a high LP(a).  He also mentioned today that multiple family members have the factor V Leiden mutation and there have been venous thromboses in the family.  Since he is contemplating surgery, we should look for this as it may play a role in perioperative anticoagulation.  With regards to cardiovascular risk, he says he is fairly active and denies any chest pain or shortness of breath although he is scheduled for what would be a high risk surgery.  07/19/2022  Mr. Michon returns today for follow-up.  He underwent successful vascular procedures.  He reports he is walking better with less pain.  He had an LP(a) tested which was negative and 8.7  nmol/L.  His overall cholesterol is quite low with total of 70, HDL 34, triglycerides 113 and LDL 13.  He is denying any chest pain.  He had a negative stress test prior to that which is reassuring.  PMHx:  Past Medical History:  Diagnosis Date   Asthma    as a child/teenager   Diabetes mellitus without complication ([place])    Factor V Leiden ([place])    Family history of factor V Leiden mutation    Peripheral vascular disease ([place])    PAD    Past Surgical History:  Procedure Laterality Date   AORTA - BILATERAL FEMORAL ARTERY BYPASS GRAFT N/A 04/22/2022   Procedure: AORTOBIFEMORAL BYPASS GRAFT;  Surgeon: [name], MD;  Location: [facility] OR;  Service: Vascular;  Laterality: N/A;   APPENDECTOMY     WISDOM TOOTH EXTRACTION      FAMHx:  Family History  Problem Relation Age of Onset   Factor V Leiden deficiency Mother    Fibromyalgia Mother    Diabetes Father    Hyperlipidemia Father    Heart attack Father        threw a blood clot when kidney's shut down   Breast cancer Maternal Aunt    Non-Hodgkin's lymphoma Paternal Aunt    Ovarian cancer Paternal Aunt    Stomach cancer Paternal Aunt    Colon cancer Neg Hx    Esophageal cancer Neg Hx    Rectal cancer Neg Hx  SOCHx:   reports that he has quit smoking. His smoking use included cigarettes. He started smoking about 29 years ago. He smoked an average of .25 packs per day. He has never used smokeless tobacco. He reports current alcohol use. He reports current drug use. Drug: Marijuana.  ALLERGIES:  No Known Allergies  ROS: Pertinent items noted in HPI and remainder of comprehensive ROS otherwise negative.  HOME MEDS: Current Outpatient Medications on File Prior to Visit  Medication Sig Dispense Refill   aspirin EC 81 MG tablet Take 1 tablet (81 mg total) by mouth daily. Swallow whole. 30 tablet 0   atorvastatin (LIPITOR) 40 MG tablet Take 1 tablet (40 mg total) by mouth daily. 90 tablet 3   Blood Glucose Monitoring Suppl  (FREESTYLE LITE) w/Device KIT Please use as directed to check sugar twice daily. 1 kit 2   FREESTYLE LITE test strip 1 each by Other route 2 (two) times daily. 200 each 1   Lancets (FREESTYLE) lancets      mirtazapine (REMERON) 15 MG tablet Take 1 tablet (15 mg total) by mouth at bedtime. 90 tablet 0   Current Facility-Administered Medications on File Prior to Visit  Medication Dose Route Frequency Provider Last Rate Last Admin   0.9 %  sodium chloride infusion  500 mL Intravenous Once [name], MD        LABS/IMAGING: No results found for this or any previous visit (from the past 48 hour(s)). No results found.  LIPID PANEL:    Component Value Date/Time   CHOL 70 03/14/2022 0851   TRIG 113.0 03/14/2022 0851   HDL 34.00 (L) 03/14/2022 0851   CHOLHDL 2 03/14/2022 0851   VLDL 22.6 03/14/2022 0851   LDLCALC 13 03/14/2022 0851    WEIGHTS: Wt Readings from Last 3 Encounters:  07/19/22 205 lb (93 kg)  07/15/22 202 lb (91.6 kg)  06/26/22 206 lb 3.2 oz (93.5 kg)    VITALS: BP 125/86 (BP Location: Left Arm, Patient Position: Sitting, Cuff Size: Normal)   Pulse 79   Ht _0  (1.88 m)   Wt 205 lb (93 kg)   SpO2 97%   BMI 26.32 kg/m   EXAM: Deferred  EKG: Deferred  ASSESSMENT: Intermediate to high risk for a high risk procedure -okay to proceed, negative Myoview stress test (2023) PAD with lifestyle limiting claudication Multiple family members with factor V Leiden mutation Genetic dyslipidemia Type 2 diabetes Former smoker Negative LP(a)  PLAN: 1.   Mr. Beckford did well with his vascular surgery procedure.  He says he is getting around better without as much pain.  His LP(a) was negative.  Lipids are well-controlled on high-dose statin.  He is on aspirin.  I think he is actually doing quite well.  He stopped smoking and is doing reasonably well with that.  I have no further suggestions at this time for additional risk management.  Plan follow-up with me as  needed.  Pixie Casino, MD, [facility], [place] Director of the Advanced Lipid Disorders &  Cardiovascular Risk Reduction Clinic Diplomate of the American Board of Clinical Lipidology Attending Cardiologist  Direct Dial: [phone]  Fax: [phone]  Website:  [email or web address]  Nadean Corwin Yahmir Sokolov 07/19/2022, 1:32 PM

## 2022-08-13 ENCOUNTER — Ambulatory Visit (AMBULATORY_SURGERY_CENTER): Payer: Medicaid Other | Admitting: *Deleted

## 2022-08-13 VITALS — Ht 74.0 in | Wt 205.0 lb

## 2022-08-13 DIAGNOSIS — Z86010 Personal history of colonic polyps: Secondary | ICD-10-CM

## 2022-08-13 MED ORDER — PEG 3350-KCL-NA BICARB-NACL 420 G PO SOLR: 4000.0000 mL | Freq: Once | ORAL | 0 refills | Status: AC

## 2022-08-13 MED ORDER — METOCLOPRAMIDE HCL 5 MG PO TABS: ORAL_TABLET | ORAL | 0 refills | Status: DC

## 2022-08-13 NOTE — Progress Notes (Signed)
No egg or soy allergy known to patient  No issues known to pt with past sedation with any surgeries or procedures Patient denies ever being told they had issues or difficulty with intubation  No FH of Malignant Hyperthermia Pt is not on diet pills Pt is not on home 02  Pt is not on blood thinners  Pt denies issues with constipation  No A fib or A flutter Have any cardiac testing pending--NO Pt instructed to use Singlecare.com or GoodRx for a price reduction on prep   

## 2022-08-20 ENCOUNTER — Encounter (HOSPITAL_COMMUNITY): Payer: Self-pay | Admitting: Gastroenterology

## 2022-08-20 NOTE — Progress Notes (Signed)
Attempted to obtain medical history via telephone, unable to reach at this time. HIPAA compliant voicemail message left requesting return call to pre surgical testing department.  

## 2022-08-22 ENCOUNTER — Telehealth: Payer: Self-pay

## 2022-08-22 NOTE — Telephone Encounter (Signed)
Called and spoke with patient to confirm colonoscopy procedure appt at [facility] with Dr. Loletha Carrow on Wednesday, 08/27/22 at 8:30 am. Pt confirmed that he has his prep and instructions. Pt is aware that he will need to arrive at [facility] by 7:00 am with a care partner. Pt verbalized understanding and had no concerns at the end of the call.

## 2022-08-26 ENCOUNTER — Ambulatory Visit (INDEPENDENT_AMBULATORY_CARE_PROVIDER_SITE_OTHER): Payer: 59 | Admitting: Physician Assistant

## 2022-08-26 ENCOUNTER — Encounter: Payer: Self-pay | Admitting: Physician Assistant

## 2022-08-26 VITALS — BP 124/86 | HR 88 | Temp 98.0°F | Ht 74.0 in | Wt 215.2 lb

## 2022-08-26 DIAGNOSIS — E1140 Type 2 diabetes mellitus with diabetic neuropathy, unspecified: Secondary | ICD-10-CM

## 2022-08-26 DIAGNOSIS — R630 Anorexia: Secondary | ICD-10-CM

## 2022-08-26 DIAGNOSIS — G479 Sleep disorder, unspecified: Secondary | ICD-10-CM | POA: Diagnosis not present

## 2022-08-26 NOTE — Patient Instructions (Signed)
Keep up the great work! Regular check of your feet every 3-5 days. Call if any concerns

## 2022-08-26 NOTE — Assessment & Plan Note (Signed)
Greatly improved with Remeron 15 mg at bedtime. Will continue at this dose.

## 2022-08-26 NOTE — Assessment & Plan Note (Signed)
Lab Results  Component Value Date   HGBA1C 6.0 (A) 05/31/2022   Glucose per morning readings stable, controlled. Will continue diet control at this time. Recheck labs next visit.

## 2022-08-26 NOTE — Progress Notes (Signed)
Subjective:    Patient ID: Bryan Hull, male    DOB: 03-09-1976, 47 y.o.   MRN: [id number]  Chief Complaint  Patient presents with   Follow-up    Pt in the office for 2 mon f/u; pt doing 2nd colonoscopy tomorrow;     HPI Patient is in today for 2 month f/up diabetes, weight, sleep, appetite.   Glucose running around 115 in the mornings. Diet controlled.  Still taking Remeron 15 mg at bedtime, sleeping well and good appetite. Happy to be at work again.   Still has significant neuropathy in left foot, but talked with V&V, said this is probably best it is going to be. Has to be mindful where his foot is at times. No sores. Watches feet regularly.    Past Medical History:  Diagnosis Date   Asthma    as a child/teenager   Blood transfusion without reported diagnosis    SEPTEMBER   Clotting disorder ([place])    FACTOR V   Diabetes mellitus without complication ([place])    Factor V Leiden ([place])    Family history of factor V Leiden mutation    Neuromuscular disorder ([place])    NEUROPATHY   Peripheral vascular disease ([place])    PAD    Past Surgical History:  Procedure Laterality Date   AORTA - BILATERAL FEMORAL ARTERY BYPASS GRAFT N/A 04/22/2022   Procedure: AORTOBIFEMORAL BYPASS GRAFT;  Surgeon: [name], MD;  Location: [facility] OR;  Service: Vascular;  Laterality: N/A;   APPENDECTOMY     COLONOSCOPY     WISDOM TOOTH EXTRACTION      Family History  Problem Relation Age of Onset   Colon polyps Mother    Factor V Leiden deficiency Mother    Fibromyalgia Mother    Diabetes Father    Hyperlipidemia Father    Heart attack Father        threw a blood clot when kidney's shut down   Breast cancer Maternal Aunt    Non-Hodgkin's lymphoma Paternal Aunt    Ovarian cancer Paternal Aunt    Stomach cancer Paternal Aunt    Colon cancer Neg Hx    Esophageal cancer Neg Hx    Rectal cancer Neg Hx    Crohn's disease Neg Hx    Ulcerative colitis Neg Hx     Social History   Tobacco  Use   Smoking status: Former    Packs/day: 0.25    Types: Cigarettes    Start date: 05/21/1993   Smokeless tobacco: Never  Vaping Use   Vaping Use: Some days   Substances: THC  Substance Use Topics   Alcohol use: Yes    Comment: rare   Drug use: Yes    Types: Marijuana    Comment: smoked maraijuana last night 08/12/22     No Known Allergies  Review of Systems NEGATIVE UNLESS OTHERWISE INDICATED IN HPI      Objective:     BP 124/86 (BP Location: Left Arm)   Pulse 88   Temp 98 F (36.7 C) (Temporal)   Ht '6\' 2"'$  (1.88 m)   Wt 215 lb 3.2 oz (97.6 kg)   SpO2 98%   BMI 27.63 kg/m   Wt Readings from Last 3 Encounters:  08/26/22 215 lb 3.2 oz (97.6 kg)  08/13/22 205 lb (93 kg)  07/19/22 205 lb (93 kg)    BP Readings from Last 3 Encounters:  08/26/22 124/86  07/19/22 125/86  07/15/22 131/86  Physical Exam Constitutional:      Appearance: Normal appearance.  Cardiovascular:     Rate and Rhythm: Normal rate and regular rhythm.     Pulses: Normal pulses.     Heart sounds: No murmur heard. Pulmonary:     Effort: Pulmonary effort is normal.     Breath sounds: Normal breath sounds.  Lymphadenopathy:     Head:     Right side of head: No submental, submandibular or tonsillar adenopathy.     Left side of head: No submental, submandibular or tonsillar adenopathy.     Cervical: No cervical adenopathy.     Upper Body:     Right upper body: No supraclavicular adenopathy.     Left upper body: No supraclavicular adenopathy.  Neurological:     General: No focal deficit present.     Mental Status: He is alert and oriented to person, place, and time.  Psychiatric:        Mood and Affect: Mood normal.        Behavior: Behavior normal.        Assessment & Plan:  Difficulty sleeping Assessment & Plan: Greatly improved with Remeron 15 mg at bedtime. Will continue at this dose.    Loss of appetite Assessment & Plan: Greatly improved with Remeron 15 mg at bedtime.  Will continue at this dose.    Type 2 diabetes mellitus with diabetic neuropathy, without long-term current use of insulin (HCC) Assessment & Plan: Lab Results  Component Value Date   HGBA1C 6.0 (A) 05/31/2022   Glucose per morning readings stable, controlled. Will continue diet control at this time. Recheck labs next visit.          Return in about 4 months (around 12/25/2022) for recheck fasting labs, overall recheck .  This note was prepared with assistance of Systems analyst. Occasional wrong-word or sound-a-like substitutions may have occurred due to the inherent limitations of voice recognition software.    Elleigh Cassetta M Bela Bonaparte, PA-C

## 2022-08-27 ENCOUNTER — Ambulatory Visit (HOSPITAL_COMMUNITY): Payer: 59 | Admitting: Anesthesiology

## 2022-08-27 ENCOUNTER — Ambulatory Visit (HOSPITAL_COMMUNITY)
Admission: RE | Admit: 2022-08-27 | Discharge: 2022-08-27 | Disposition: A | Discharge status: Home / Self Care | Payer: 59 | Attending: Gastroenterology | Admitting: Gastroenterology

## 2022-08-27 ENCOUNTER — Encounter (HOSPITAL_COMMUNITY): Payer: Self-pay | Admitting: Gastroenterology

## 2022-08-27 ENCOUNTER — Encounter (HOSPITAL_COMMUNITY)
Admission: RE | Disposition: A | Discharge status: Home / Self Care | Payer: Self-pay | Source: Home / Self Care | Attending: Gastroenterology

## 2022-08-27 ENCOUNTER — Ambulatory Visit (HOSPITAL_BASED_OUTPATIENT_CLINIC_OR_DEPARTMENT_OTHER): Payer: 59 | Admitting: Anesthesiology

## 2022-08-27 DIAGNOSIS — K648 Other hemorrhoids: Secondary | ICD-10-CM

## 2022-08-27 DIAGNOSIS — E1151 Type 2 diabetes mellitus with diabetic peripheral angiopathy without gangrene: Secondary | ICD-10-CM | POA: Insufficient documentation

## 2022-08-27 DIAGNOSIS — Z87891 Personal history of nicotine dependence: Secondary | ICD-10-CM | POA: Insufficient documentation

## 2022-08-27 DIAGNOSIS — D126 Benign neoplasm of colon, unspecified: Secondary | ICD-10-CM

## 2022-08-27 DIAGNOSIS — Z86718 Personal history of other venous thrombosis and embolism: Secondary | ICD-10-CM | POA: Insufficient documentation

## 2022-08-27 DIAGNOSIS — D122 Benign neoplasm of ascending colon: Secondary | ICD-10-CM

## 2022-08-27 DIAGNOSIS — Z7984 Long term (current) use of oral hypoglycemic drugs: Secondary | ICD-10-CM | POA: Diagnosis not present

## 2022-08-27 DIAGNOSIS — D120 Benign neoplasm of cecum: Secondary | ICD-10-CM

## 2022-08-27 DIAGNOSIS — J45909 Unspecified asthma, uncomplicated: Secondary | ICD-10-CM

## 2022-08-27 HISTORY — PX: COLONOSCOPY WITH PROPOFOL: SHX5780

## 2022-08-27 HISTORY — PX: ENDOSCOPIC MUCOSAL RESECTION: SHX6839

## 2022-08-27 HISTORY — PX: SUBMUCOSAL LIFTING INJECTION: SHX6855

## 2022-08-27 HISTORY — PX: HEMOSTASIS CLIP PLACEMENT: SHX6857

## 2022-08-27 HISTORY — PX: POLYPECTOMY: SHX5525

## 2022-08-27 LAB — GLUCOSE, CAPILLARY: Glucose-Capillary: 125 mg/dL — ABNORMAL HIGH (ref 70–99)

## 2022-08-27 SURGERY — COLONOSCOPY WITH PROPOFOL
Anesthesia: Monitor Anesthesia Care

## 2022-08-27 MED ORDER — LACTATED RINGERS IV SOLN: INTRAVENOUS | Status: DC

## 2022-08-27 MED ORDER — SODIUM CHLORIDE 0.9 % IV SOLN: INTRAVENOUS | Status: DC

## 2022-08-27 MED ORDER — PROPOFOL 500 MG/50ML IV EMUL
INTRAVENOUS | Status: DC | PRN
  Administered 2022-08-27: 150 ug/kg/min via INTRAVENOUS

## 2022-08-27 MED ORDER — LIDOCAINE 2% (20 MG/ML) 5 ML SYRINGE
INTRAMUSCULAR | Status: DC | PRN
  Administered 2022-08-27: 60 mg via INTRAVENOUS

## 2022-08-27 MED ORDER — PROPOFOL 10 MG/ML IV BOLUS
INTRAVENOUS | Status: DC | PRN
  Administered 2022-08-27 (×6): 20 mg via INTRAVENOUS

## 2022-08-27 SURGICAL SUPPLY — 22 items

## 2022-08-27 NOTE — H&P (Signed)
History and Physical:  This patient presents for endoscopic testing for:   47 year old man here today for colonoscopy in order to perform EMR of large ascending colon polyp discovered on colonoscopy last month. Bowel preparation was poor on the prior exam as well. Patient has been feeling well since I last saw him, is no longer losing weight.  Patient is otherwise without complaints or active issues today.   Past Medical History: Past Medical History:  Diagnosis Date   Asthma    as a child/teenager   Blood transfusion without reported diagnosis    SEPTEMBER   Clotting disorder ([place])    FACTOR V   Diabetes mellitus without complication ([place])    Factor V Leiden ([place])    Family history of factor V Leiden mutation    Neuromuscular disorder ([place])    NEUROPATHY   Peripheral vascular disease ([place])    PAD     Past Surgical History: Past Surgical History:  Procedure Laterality Date   AORTA - BILATERAL FEMORAL ARTERY BYPASS GRAFT N/A 04/22/2022   Procedure: AORTOBIFEMORAL BYPASS GRAFT;  Surgeon: [name], MD;  Location: [facility] OR;  Service: Vascular;  Laterality: N/A;   APPENDECTOMY     COLONOSCOPY     WISDOM TOOTH EXTRACTION      Allergies: No Known Allergies  Outpatient Meds: Current Facility-Administered Medications  Medication Dose Route Frequency Provider Last Rate Last Admin   0.9 %  sodium chloride infusion   Intravenous Continuous Danis, Estill Cotta III, MD       lactated ringers infusion   Intravenous Continuous [name], MD 50 mL/hr at 08/27/22 0743 Continued from Pre-op at 08/27/22 0743      ___________________________________________________________________ Objective   Exam:  BP (!) 146/87   Pulse 73   Temp 97.7 F (36.5 C) (Temporal)   Resp 14   Ht '6\' 2"'$  (1.88 m)   Wt 97.5 kg   SpO2 98%   BMI 27.60 kg/m   CV: regular , S1/S2 Resp: clear to auscultation bilaterally, normal RR and effort noted GI: soft, no tenderness, with active bowel  sounds.   Assessment: Adenomatous colon polyp   Plan: Colonoscopy  The benefits and risks of the planned procedure were described in detail with the patient or (when appropriate) their health care proxy.  Risks were outlined as including, but not limited to, bleeding, infection, perforation, adverse medication reaction leading to cardiac or pulmonary decompensation, pancreatitis (if ERCP).  The limitation of incomplete mucosal visualization was also discussed.  No guarantees or warranties were given.    The patient is appropriate for an endoscopic procedure in the ambulatory setting.   - Wilfrid Lund, MD

## 2022-08-27 NOTE — Transfer of Care (Signed)
Immediate Anesthesia Transfer of Care Note  Patient: CLEARANCE CHENAULT  Procedure(s) Performed: COLONOSCOPY WITH PROPOFOL POLYPECTOMY ENDOSCOPIC MUCOSAL RESECTION SUBMUCOSAL TATTOO INJECTION HEMOSTASIS CONTROL HEMOSTASIS CLIP PLACEMENT  Patient Location: Endoscopy Unit  Anesthesia Type:MAC  Level of Consciousness: awake  Airway & Oxygen Therapy: Patient Spontanous Breathing and Patient connected to face mask oxygen  Post-op Assessment: Report given to RN and Post -op Vital signs reviewed and stable  Post vital signs: Reviewed and stable  Last Vitals:  Vitals Value Taken Time  BP    Temp    Pulse    Resp    SpO2      Last Pain:  Vitals:   08/27/22 0812  TempSrc:   PainSc: 0-No pain         Complications: No notable events documented.

## 2022-08-27 NOTE — Anesthesia Preprocedure Evaluation (Signed)
Anesthesia Evaluation  Patient identified by MRN, date of birth, ID band Patient awake    Reviewed: Allergy & Precautions, NPO status , Patient's Chart, lab work & pertinent test results  Airway Mallampati: II  TM Distance: >3 FB Neck ROM: Full    Dental  (+) Dental Advisory Given, Poor Dentition   Pulmonary asthma , former smoker   Pulmonary exam normal        Cardiovascular + Peripheral Vascular Disease  DVT: Critical limb ischemia of left lower extremity with rest pain.Normal cardiovascular exam     Neuro/Psych negative neurological ROS     GI/Hepatic negative GI ROS, Neg liver ROS,,,  Endo/Other  diabetes, Type 2, Oral Hypoglycemic Agents    Renal/GU negative Renal ROS     Musculoskeletal negative musculoskeletal ROS (+)    Abdominal Normal abdominal exam  (+)   Peds  Hematology negative hematology ROS (+)   Anesthesia Other Findings   Reproductive/Obstetrics                             Anesthesia Physical Anesthesia Plan  ASA: 3  Anesthesia Plan: MAC   Post-op Pain Management:    Induction:   PONV Risk Score and Plan: 0  Airway Management Planned:   Additional Equipment:   Intra-op Plan:   Post-operative Plan:   Informed Consent: I have reviewed the patients History and Physical, chart, labs and discussed the procedure including the risks, benefits and alternatives for the proposed anesthesia with the patient or authorized representative who has indicated his/her understanding and acceptance.       Plan Discussed with: CRNA  Anesthesia Plan Comments:         Anesthesia Quick Evaluation

## 2022-08-27 NOTE — Interval H&P Note (Signed)
History and Physical Interval Note:  08/27/2022 8:08 AM  Bryan Hull  has presented today for surgery, with the diagnosis of hx of colon polyps.  The various methods of treatment have been discussed with the patient and family. After consideration of risks, benefits and other options for treatment, the patient has consented to  Procedure(s): COLONOSCOPY WITH PROPOFOL (N/A) as a surgical intervention.  The patient's history has been reviewed, patient examined, no change in status, stable for surgery.  I have reviewed the patient's chart and labs.  Questions were answered to the patient's satisfaction.     [name]

## 2022-08-27 NOTE — Discharge Instructions (Signed)
YOU HAD AN ENDOSCOPIC PROCEDURE TODAY: Refer to the procedure report and other information in the discharge instructions given to you for any specific questions about what was found during the examination. If this information does not answer your questions, please call [place] office at [phone] to clarify.  ° °YOU SHOULD EXPECT: Some feelings of bloating in the abdomen. Passage of more gas than usual. Walking can help get rid of the air that was put into your GI tract during the procedure and reduce the bloating. If you had a lower endoscopy (such as a colonoscopy or flexible sigmoidoscopy) you may notice spotting of blood in your stool or on the toilet paper. Some abdominal soreness may be present for a day or two, also. ° °DIET: Your first meal following the procedure should be a light meal and then it is ok to progress to your normal diet. A half-sandwich or bowl of soup is an example of a good first meal. Heavy or fried foods are harder to digest and may make you feel nauseous or bloated. Drink plenty of fluids but you should avoid alcoholic beverages for 24 hours. If you had a esophageal dilation, please see attached instructions for diet.   ° °ACTIVITY: Your care partner should take you home directly after the procedure. You should plan to take it easy, moving slowly for the rest of the day. You can resume normal activity the day after the procedure however YOU SHOULD NOT DRIVE, use power tools, machinery or perform tasks that involve climbing or major physical exertion for 24 hours (because of the sedation medicines used during the test).  ° °SYMPTOMS TO REPORT IMMEDIATELY: °A gastroenterologist can be reached at any hour. Please call [phone]  for any of the following symptoms:  °Following lower endoscopy (colonoscopy, flexible sigmoidoscopy) °Excessive amounts of blood in the stool  °Significant tenderness, worsening of abdominal pains  °Swelling of the abdomen that is new, acute  °Fever of 100° or  higher  °Following upper endoscopy (EGD, EUS, ERCP, esophageal dilation) °Vomiting of blood or coffee ground material  °New, significant abdominal pain  °New, significant chest pain or pain under the shoulder blades  °Painful or persistently difficult swallowing  °New shortness of breath  °Black, tarry-looking or red, bloody stools ° °FOLLOW UP:  °If any biopsies were taken you will be contacted by phone or by letter within the next 1-3 weeks. Call [phone]  if you have not heard about the biopsies in 3 weeks.  °Please also call with any specific questions about appointments or follow up tests. ° °

## 2022-08-27 NOTE — Anesthesia Procedure Notes (Signed)
Procedure Name: MAC Date/Time: 08/27/2022 8:20 AM  Performed by: Lieutenant Diego, CRNAPre-anesthesia Checklist: Patient identified, Emergency Drugs available, Patient being monitored, Timeout performed and Suction available Patient Re-evaluated:Patient Re-evaluated prior to induction Oxygen Delivery Method: Simple face mask Preoxygenation: Pre-oxygenation with 100% oxygen Induction Type: IV induction

## 2022-08-27 NOTE — Anesthesia Postprocedure Evaluation (Signed)
Anesthesia Post Note  Patient: Bryan Hull  Procedure(s) Performed: COLONOSCOPY WITH PROPOFOL POLYPECTOMY ENDOSCOPIC MUCOSAL RESECTION HEMOSTASIS CLIP PLACEMENT SUBMUCOSAL LIFTING INJECTION     Patient location during evaluation: Endoscopy Anesthesia Type: MAC Level of consciousness: awake Pain management: pain level controlled Vital Signs Assessment: post-procedure vital signs reviewed and stable Respiratory status: spontaneous breathing Cardiovascular status: stable Postop Assessment: no apparent nausea or vomiting Anesthetic complications: no  No notable events documented.  Last Vitals:  Vitals:   08/27/22 0905 08/27/22 0915  BP: (!) 152/92 136/88  Pulse: 81 71  Resp: 15   Temp:    SpO2: 97% 99%    Last Pain:  Vitals:   08/27/22 0915  TempSrc:   PainSc: 0-No pain                 Huston Foley

## 2022-08-27 NOTE — Op Note (Signed)
[facility] Patient Name: Bryan Hull Procedure Date: 08/27/2022 MRN: [id number] Attending MD: Estill Cotta. Loletha Carrow , MD, [id number] Date of Birth: August 08, 1975 CSN: [id number] Age: 47 Admit Type: Outpatient Procedure:                Colonoscopy Indications:              For therapy of adenomatous polyps in the colon                           advanced adenoma right colon discovered Dec 2023 -                            poor prep on that exam Providers:                Mallie Mussel L. Loletha Carrow, MD, Jobe Igo, RN, Cherylynn Ridges, Technician Referring MD:              Medicines:                Monitored Anesthesia Care Complications:            No immediate complications. Estimated Blood Loss:     Estimated blood loss was minimal. Procedure:                Pre-Anesthesia Assessment:                           - Prior to the procedure, a History and Physical                            was performed, and patient medications and                            allergies were reviewed. The patient's tolerance of                            previous anesthesia was also reviewed. The risks                            and benefits of the procedure and the sedation                            options and risks were discussed with the patient.                            All questions were answered, and informed consent                            was obtained. Prior Anticoagulants: The patient has                            taken no anticoagulant or antiplatelet agents. ASA  Grade Assessment: III - A patient with severe                            systemic disease. After reviewing the risks and                            benefits, the patient was deemed in satisfactory                            condition to undergo the procedure.                           After obtaining informed consent, the colonoscope                            was passed under  direct vision. Throughout the                            procedure, the patient's blood pressure, pulse, and                            oxygen saturations were monitored continuously. The                            CF-HQ190L ([id number]) Olympus colonoscope was                            introduced through the anus and advanced to the the                            cecum, identified by appendiceal orifice and                            ileocecal valve. The colonoscopy was performed                            without much difficulty (somewhat redundant and                            spastic colon). The patient tolerated the procedure                            well. The quality of the bowel preparation was good                            after lavage of opaque liquid. The ileocecal valve,                            appendiceal orifice, and anus were photographed                            (technical problem with Provation did not save  rectal retroflexion photo). The bowel preparation                            used was GoLYTELY via split dose instruction. Scope In: 8:17:55 AM Scope Out: 8:51:50 AM Total Procedure Duration: 0 hours 33 minutes 55 seconds  Findings:      The perianal and digital rectal examinations were normal.      A diminutive polyp was found in the cecum. The polyp was sessile. The       polyp was removed with a cold snare. Resection and retrieval were       complete.      A 20 mm polyp was found in the mid ascending colon (perhaps smaller than       previously noted - better visualization). The polyp was multi-lobulated       and sessile. Preparations were made for mucosal resection. Demarcation       of the lesion was performed with high-definition white light to clearly       identify the boundaries of the lesion. EverLift was injected to raise       the lesion. Snare mucosal resection was performed. Resection (en bloc)       and retrieval were  complete. Resected tissue margins were examined and       clear of polyp tissue. To prevent bleeding post-intervention, two       hemostatic clips were successfully placed (MR conditional).      Internal hemorrhoids were found.      The exam was otherwise without abnormality on direct and retroflexion       views. Impression:               - One diminutive polyp in the cecum, removed with a                            cold snare. Resected and retrieved.                           - One 20 mm polyp in the mid ascending colon,                            removed with mucosal resection. Resected and                            retrieved. Clips (MR conditional) were placed.                           - Internal hemorrhoids.                           - The examination was otherwise normal on direct                            and retroflexion views.                           - Mucosal resection was performed. Resection and  retrieval were complete. Moderate Sedation:      MAC sedation used Recommendation:           - Patient has a contact number available for                            emergencies. The signs and symptoms of potential                            delayed complications were discussed with the                            patient. Return to normal activities tomorrow.                            Written discharge instructions were provided to the                            patient.                           - Resume previous diet.                           - Continue present medications.                           - Await pathology results.                           - Repeat colonoscopy is recommended for                            surveillance. The colonoscopy date will be                            determined after pathology results from today's                            exam become available for review. Procedure Code(s):        --- Professional ---                            [phone], Colonoscopy, flexible; with endoscopic                            mucosal resection                           [id number], 69, Colonoscopy, flexible; with removal of                            tumor(s), polyp(s), or other lesion(s) by snare                            technique Diagnosis Code(s):        --- Professional ---  D12.6, Benign neoplasm of colon, unspecified                           D12.0, Benign neoplasm of cecum                           D12.2, Benign neoplasm of ascending colon CPT copyright 2022 American [employer] Association. All rights reserved. The codes documented in this report are preliminary and upon coder review may  be revised to meet current compliance requirements. Quamir Willemsen L. Loletha Carrow, MD 08/27/2022 9:05:21 AM This report has been signed electronically. Number of Addenda: 0

## 2022-08-28 LAB — SURGICAL PATHOLOGY

## 2022-08-30 ENCOUNTER — Encounter: Payer: Self-pay | Admitting: Gastroenterology

## 2022-09-09 NOTE — Unmapped (Signed)
Formatting of this note might be different from the original.  Sleep Hygiene Tips    - Set regular sleep hours/schedule.   - Go to bed only when sleepy.   - Regular wake up time.   - Sunlight exposure in the morning upon waking up.   - Avoid naps during the day (especially long naps).   - Avoid driving if/when feeling sleepy.  - Avoid of caffeine after noon.   - Give yourself adequate time to sleep, have a consistent sleep-wake cycle.   - If you cannot fall asleep in 20 minutes, get out of bed and go back to sleep when you feel sleepy.   - Do not keep a clock in the bedroom.   - Keep the bedroom cold (between 68 degrees and 70 degrees).  - No alcohol prior to going to sleep.   - Do not watch tv prior to going to sleep.   - Do not exercise before going to sleep.   - Avoid intake of food 2 hours prior to sleep time especially if he have  GERD.    Patient Education Resources    1) www.myapnea.org - Resource section    2) www.sleepeducation.org    3) The importance of weight reduction: a 10% reduction in body weight can reduce sleep apnea by 50%.     MyChart: Please sign up for MyChart account to have access to your medical records and ease of communication with your provider(s).  Electronically signed by Sharyon Cable, PA-C at 09/09/2022  3:22 PM EST

## 2022-09-09 NOTE — Progress Notes (Signed)
Formatting of this note is different from the original.  Sleep Medicine - Follow up Office visit    Date: 09/09/2022, Time: 3 13 pm10 30 pm, MRN# [id number]. Suan Halter, Male, May 07, 1976, 47yrs  Code Status: No Order  Referring MD: Lilian Kapur*   PCP: Cathlyn Parsons, FNP        Chief Complaint(s): OSA, sleep study follow-up    History of Present Illness:  Corey Shah is a 47yrs old Male here for follow up visit. Patient presents for follow up on sleep study (home sleep apnea test).  Patient goes to bed at 10 30 pm and gets out of bed at 8 am.  Per patient, since his previous visit, his sleep associated breathing disorders continue to persist.  Witnessed apnea, snoring,/choking for breath, restless/unrefreshed sleep, headaches, and feeling tired are present. Other relevant sleep-related questions were asked and Epworth sleepiness score as below.    Epworth Sleepiness Score:  9    PMHX:  Past Medical History:   Diagnosis Date    Hyperlipidemia     Obesity     Prediabetes      Surgical Hx:  No past surgical history on file.    Family Hx:  No family history on file.    Social Hx:   Social History     Tobacco Use    Smoking status: Former     Types: Cigarettes     Quit date: 08/30/2019     Years since quitting: 3.0    Smokeless tobacco: Never   Substance Use Topics    Alcohol use: Not Currently    Drug use: Not Currently     Medication:     Current Medication:  Current Outpatient Medications   Medication Sig Dispense Refill    Discharge Equipment: Home Supplies (HOME USE) as directed. To [facility]. Dispense/replace all needed CPAP supplies such as mask, heated tubing, filters, head gear, heated humidifier, etc as needed. 1 Each 0    atorvastatin (LIPITOR) 40 mg Oral Tablet 1 tablet Orally Once a day at bedtime for cholesterol for 90 days      meloxicam (MOBIC) 15 mg Oral Tablet Take 1 Tablet by mouth daily.      metFORMIN XR (GLUCOPHAGE-XR) 500 mg Oral Tablet Sustained Release 24HR SMARTSIG:1 Tablet(s) By  Mouth Every Evening       No current facility-administered medications for this visit.       Allergies:   Patient has no known allergies.    SLEEP STUDY RESULTS:  I reviewed the sleep study data independently.    Home Sleep Apnea Test report dated 08/08/2022    PHYSICIAN INTERPRETATION AND COMMENTS: Findings are consistent with moderate, positional obstructive sleep apnea(OSA).     CLINICAL HISTORY: 47 year old male presented with: 17.5 inch neck, BMI of 34.5, an Epworth sleepiness score of 7, no comorbidities and symptoms of nocturnal snoring, waking up choking and witnessed apneas. Based on the clinical history, the patient has a high pre-test probability of having Severe OSA.     SLEEP STUDY FINDINGS: Patient underwent a 1 night Home Sleep Test and by behavioral criteria, slept for approximately 5.88 hours, with a sleep latency of 4 minutes and a sleep efficiency of 91.1%. Moderate sleep disordered breathing (AHI=23) is noted based on a 4% hypopnea desaturation criteria, predominantly in the supine position (78 events/hour). The patient slept supine 21.9% of the night based on valid recording time of 5.88 hours and is 11.1 times as likely  to have apneas/hypopneas when supine. When considering more subtle measures of sleep disordered breathing, the overall respiratory disturbance index is moderate(RDI=32) based on a 1% hypopnea desaturation criteria with confirmation by surrogate arousal indicators. The apneas/hypopneas are accompanied by minimal oxygen desaturation (percent time below 90% SpO2: 1.9%, Min SpO2: 83.5%). The average desaturation across all sleep disordered breathing events is 3.7%. Snoring occurs for 41% (30 dB) of the study, 19.6% is extremely loud. The mean pulse rate is 82.5 BPM, with very frequent pulse rate variability (85 events with >= 6 BPM increase/decrease per hour).     TREATMENT CONSIDERATIONS: Consider nasal continuous positive airway pressure (CPAP/AutoPAP - ex. APAP 4-14 cm H20) as  the initial treatment choice based on the AHI severity. The patient should avoid sleeping supine; the non-supine AHI is 11.1 times less severe than the supine AHI.     DISEASE MANAGEMENT CONSIDERATIONS: Morning headaches are symptoms that can be associated with untreated OSA.    Review of Systems:  Review of Systems   Constitutional:  Positive for malaise/fatigue. Negative for weight loss.   HENT:  Positive for congestion and sinus pain.         Allergies   Respiratory:  Positive for cough and shortness of breath.    Cardiovascular:  Positive for leg swelling. Negative for chest pain and palpitations.   Gastrointestinal:  Positive for heartburn.   Genitourinary:  Negative for frequency.   Musculoskeletal:  Negative for joint pain.   Skin:  Negative for itching.   Neurological:  Positive for headaches. Negative for dizziness, seizures and weakness.   Psychiatric/Behavioral:  Negative for memory loss. The patient is not nervous/anxious and does not have insomnia.        Physical Exam:    VS:     BP 128/88   Pulse 97   Temp 98.1 F (36.7 C)   Resp 20   Ht 5\' 8"  (1.727 m)   Wt 224 lb (101.6 kg)   SpO2 95%   BMI 34.06 kg/m     General appearance: alert, no distress, cooperative  Nose: clear rhinorrhea  Oropharynx: no erythema or exudates noted. Teeth and gums normal.  Malampati: 3  Tonsils: Reduced posterior pharyngeal space  Neck: supple, no adenopathy, no JVD  Lungs: clear to auscultation and no wheezing  Heart: regular rate and rhythm and no murmurs, clicks, or gallops  Abdomen: Abdomen soft, non-tender. BS normal.  Skin: No lesions on exposed skin.  Extremities: Extremities normal. No deformities, edema, or skin discoloration.  Neuro:  Gait normal. Reflexes normal and symmetric. Sensation grossly normal.    Visit Diagnoses:     ICD-10-CM   1. OSA (obstructive sleep apnea) [G47.33]  G47.33   2. Loud snoring  R06.83   3. Witnessed episode of apnea  R06.81   4. Gasping for breath  R06.89   5. Restless sleeper   G47.9   6. Sleep related headaches  R51.9   7. 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 Class 1 obesity  E66.9   10. BMI 34.0-34.9,adult  Z68.34   11. Other chronic pain  G89.29   12. Prediabetes  R73.03   13. High cholesterol  E78.00   14. Former smoker  Z87.891       Assesment and Plan:   Obstructive sleep apnea (OSA) - moderate  and positional (AHI = 23/hr)  - Considering patient's symptoms, co-morbidities, and OSA severity, treatment recommended.  - Treatment options  discussed.  Has already tried MAS/oral appliance and did not tolerate.  - Recommended CPAP therapy as initial treatment of choice & patient is in agreement.   - Will initiate autoPAP therapy for the treatment of the OSA.  - AutoCPAP setting: 5 cwp - 15 cwp, EPR = 2.  - Heated humidity/tubing and mask of patient's choice.  - Patient strongly encouraged to start and continue CPAP therapy with good compliance/benefits.  - Order for new CPAP machine, setting, and supplies placed and will be sent to DME provider.    Recommended regular visits with PCP and other providers as well as adherence to management of other chronic diseases.  - Weight loss/management strategies discussed.  - Healthy lifestyle modifications and sleep positional therapy also encouraged    Return Visit in 3 months to reassess compliance and efficacy of treatment.    Discussed with patient the pathophysiology of obstructive sleep apnea. Also discussed the associated complications (cardiovascular, pulmonary, neurological, hepatic, etc) of OSA if left untreated.  Discussed the importance of weight reduction, that a 10% reduction in body weight can reduce sleep apnea by 50%. The patient was advised about driving precautions as hypersomnolence may contribute to accidents while driving or operating heavy equipment. Patient also advised to avoid excessive alcohol and other CNS suppressive medications.    Healthy Sleep Habits/Hygiene:  The patient was counseled on  the effects of poor sleep quality. Patient also advised on adequate sleep time and the sleep hygiene. Regular sleep hours. Go to bed only when sleepy. Regular wake up time. Sunlight exposure in the morning upon waking up. Avoid naps during the day (especially long naps). Avoid of caffeine after noon. Give yourself adequate time to sleep, have a consistent sleep-wake cycle. If you cannot fall asleep in 20 minutes, get out of bed and go back to sleep when you feel sleepy. Do not keep a clock in the bedroom. Keep the bedroom cold. No alcohol prior to going to sleep. Do not watch tv prior to going to sleep. Do not exercise before going to sleep. Avoid intake of food 2 hours prior to sleep time especially if you have GERD. Also avoid caffeine and nicotine    Explained the importance of CPAP compliance.  The device was explained and the proper cleaning explained.  Risks associated with non-compliance explained. Long-term benefit of compliance including improving cognition, avoiding progressive memory loss and preventing cardiovascular mortality and morbidity explained.    Also problems associated with CPAP therapy which includes dry mouth, intolerance to the mask, mask leak,  dryness of mouth due to oral breathing and prevention with chinstrap etc, was  explained to the patient. Patient was educated on proper measures to rectify the problems.    Also advised patient to discuss with the DME provider regarding any problems associated with CPAP equipment or supplies. Patient was also advised regarding downloading App [employer] MyAir or similar apps as recommended by the DME provider to monitor  patient's own CPAP compliance data.    Total time spent including counseling, reviewing notes/records, reviewing diagnostic test(s), explaining treatment options and/or alternatives, documenting, placing orders, etc. = 30 minutes.    Many parts of my notes are done by voice activated dictation device.  Please pardon any of the accidental  mistakes and nonsensical words created due to this.  I still make my best effort to correct.    Thank you for the consultation and the opportunity to assist in the care of your patient. Our recommendations are noted  above. We will continue to follow this patient with you.  Please contact us if we can be of further service at [phone].    Electronically signed by: Sharyon Cable, PA-C    Date: 09/09/2022  Electronically signed by Sharyon Cable, PA-C at 09/09/2022  3:58 PM EST

## 2022-09-11 ENCOUNTER — Ambulatory Visit: Payer: 59 | Admitting: Neurology

## 2022-09-11 ENCOUNTER — Ambulatory Visit (INDEPENDENT_AMBULATORY_CARE_PROVIDER_SITE_OTHER): Payer: BC Managed Care – PPO | Admitting: Neurology

## 2022-09-11 VITALS — BP 111/77 | HR 76 | Ht 74.0 in | Wt 216.0 lb

## 2022-09-11 DIAGNOSIS — R202 Paresthesia of skin: Secondary | ICD-10-CM

## 2022-09-11 DIAGNOSIS — E1142 Type 2 diabetes mellitus with diabetic polyneuropathy: Secondary | ICD-10-CM

## 2022-09-11 DIAGNOSIS — R531 Weakness: Secondary | ICD-10-CM

## 2022-09-11 DIAGNOSIS — R269 Unspecified abnormalities of gait and mobility: Secondary | ICD-10-CM

## 2022-09-11 NOTE — Progress Notes (Signed)
No chief complaint on file.     ASSESSMENT AND PLAN  Bryan Hull is a 47 y.o. male   Diabetic peripheral neuropathy  Laboratory evaluations showed no other treatable etiology, A1c has much improved to 6.0  Emphasized importance of optimize glucose control,  Only mildly symptomatic, may consider over-the-counter lidocaine, diclofenac gel,    DIAGNOSTIC DATA (LABS, IMAGING, TESTING) - I reviewed patient records, labs, notes, testing and imaging myself where available.  MEDICAL HISTORY:  Bryan Hull is a 47 year old male, seen in request by his primary care PA [name], Alyssa for evaluation of rapid weight loss, muscle weakness, he is accompanied by his mother at today's visit June 04, 2022  I reviewed and summarized the referring note. PMHX. 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 Desma Maxim on April 23, 2022, he had aortoiliac occlusive disease, he tolerated the procedure well  He was bridged with IV heparin, eventually restarted on Eliquis  He reported significant improvement in his lower extremity vascular claudication, he can walk better,  He noticed gradual onset lack of appetite, weight loss since December 2022, about 70 pounds over the past 1 year  He has lack of appetite, he did experience some anxiety perisurgical period of time, was started on Remeron just 3 days ago, did help him sleep better,  His  paternal grandfather died of [name] disease, at age 14, father suffered coronary vascular disease,  Patient always have some left leg difficulty, he contributed to his previous left lower extremity recurrent DVT, left leg tends to feel heavy, he denies bulbar weakness, no dysarthria, no double vision, sometimes felt his throat was closing on him, difficulty swallowing the food,  In addition he complains of intermittent headaches, sometimes preceding vision change, as if looking through a kaleidoscope, pounding headache with no significant light noise sensitivity  He was diagnosed with diabetes A1c of 9.6,  UPDATE Sep 11 2022: He was diagnosis of DM in 2023, A1C was 9.6, even before that he began to noticed bilateral plantar surface and toes paresthesia, left is worse than right, he also has low back pain, only occasionally shooting pain to bilateral legs.  He denies bowel and bladder incontinence.  Laboratory evaluations in November 2023 showed A1c 6.0, normal negative [id number], folic acid, HIV, RPR, C-reactive protein, CPK, thyroid functional test, ESR, ANA, protein electrophoresis,     PHYSICAL EXAM:   Vitals:   09/11/22 1036  Weight: 216 lb (98 kg)  Height: 6' 2"$  (1.88 m)     PHYSICAL EXAMNIATION:  Gen: NAD, conversant, well nourised, well groomed                     Cardiovascular: Regular rate rhythm, no peripheral edema, warm, nontender. Eyes: Conjunctivae clear without exudates or hemorrhage Neck: Supple, no carotid bruits. Pulmonary: Clear to auscultation bilaterally   NEUROLOGICAL EXAM:  MENTAL STATUS: Speech/cognition: Anxious looking middle-age male awake, alert, oriented to history taking and casual conversation CRANIAL  NERVES: CN II: Visual fields are full to confrontation. Pupils are round equal and briskly reactive to light. CN III, IV, VI: extraocular movement are normal. No ptosis. CN V: Facial sensation is intact to light touch CN VII: Face is symmetric  with normal eye closure  CN VIII: Hearing is normal to causal conversation. CN IX, X: Phonation is normal. CN XI: Head turning and shoulder shrug are intact  MOTOR: Upper extremity proximal and distal muscle strength is normal, bilateral lower extremity showed mild bilateral hamstring muscle weakness, moderate bilateral toe extension flexion weakness  REFLEXES: Reflexes are 2+ and symmetric at the biceps, triceps, knees, and trace at ankles. Plantar responses are flexor.  SENSORY: Length-dependent decreased light touch, pinprick vibratory sensation to ankle level  COORDINATION: There is no trunk or limb dysmetria noted.  GAIT/STANCE: Able to from seated position arm crossed, mildly limp, dragging left leg could not stand up on tiptoes and heels  REVIEW OF SYSTEMS:  Full 14 system review of systems performed and notable only for as above All other review of systems were negative.   ALLERGIES: No Known Allergies  HOME MEDICATIONS: Current Outpatient Medications  Medication Sig Dispense Refill   aspirin EC 81 MG tablet Take 1 tablet (81 mg total) by mouth daily. Swallow whole. 30 tablet 0   atorvastatin (LIPITOR) 40 MG tablet Take 1 tablet (40 mg total) by mouth daily. 90 tablet 3   Blood Glucose Monitoring Suppl (FREESTYLE LITE) w/Device KIT Please use as directed to check sugar twice daily. 1 kit 2   FREESTYLE LITE test strip 1 each by Other route 2 (two) times daily. 200 each 1   Lancets (FREESTYLE) lancets      metoCLOPramide (REGLAN) 5 MG tablet TAKE 30 MINUTES PRIOR TO EACH PRE3P DOSE 2 tablet 0   mirtazapine (REMERON) 15 MG tablet Take 1 tablet (15 mg total) by mouth at bedtime. (Patient taking differently: Take 15 mg by mouth at bedtime. Sunday-Thursday) 90 tablet 0   Current Facility-Administered Medications  Medication Dose Route Frequency Provider Last Rate Last Admin   0.9 %  sodium chloride infusion  500 mL Intravenous Once Danis, Kirke Corin, MD        PAST MEDICAL  HISTORY: Past Medical History:  Diagnosis Date   Asthma    as a child/teenager   Blood transfusion without reported diagnosis    SEPTEMBER   Clotting disorder ([place])    FACTOR V   Diabetes mellitus without complication ([place])    Factor V Leiden ([place])    Family history of factor V Leiden mutation    Neuromuscular disorder ([place])    NEUROPATHY   Peripheral vascular disease ([place])    PAD    PAST SURGICAL HISTORY: Past Surgical History:  Procedure Laterality Date   AORTA - BILATERAL FEMORAL ARTERY BYPASS GRAFT N/A 04/22/2022   Procedure: AORTOBIFEMORAL BYPASS GRAFT;  Surgeon: [name], MD;  Location: [facility];  Service: Vascular;  Laterality: N/A;   APPENDECTOMY     COLONOSCOPY     COLONOSCOPY WITH PROPOFOL N/A 08/27/2022   Procedure: COLONOSCOPY WITH PROPOFOL;  Surgeon: Doran Stabler, MD;  Location: [facility];  Service: Gastroenterology;  Laterality: N/A;   ENDOSCOPIC MUCOSAL RESECTION  08/27/2022   Procedure: ENDOSCOPIC MUCOSAL RESECTION;  Surgeon: Doran Stabler, MD;  Location: [facility];  Service: Gastroenterology;;   HEMOSTASIS CLIP PLACEMENT  08/27/2022   Procedure: HEMOSTASIS CLIP PLACEMENT;  Surgeon: Doran Stabler, MD;  [facility];  Service: Gastroenterology;;   POLYPECTOMY  08/27/2022   Procedure: POLYPECTOMY;  Surgeon: Doran Stabler, MD;  Location: [facility];  Service: Gastroenterology;;   Lia [place] LIFTING INJECTION  08/27/2022   Procedure: SUBMUCOSAL LIFTING INJECTION;  Surgeon: Doran Stabler, MD;  Location: [facility];  Service: Gastroenterology;;   WISDOM TOOTH EXTRACTION      FAMILY HISTORY: Family History  Problem Relation Age of Onset   Colon polyps Mother    Factor V Leiden deficiency Mother    Fibromyalgia Mother    Diabetes Father    Hyperlipidemia Father    Heart attack Father        threw a blood clot when kidney's shut down   Breast cancer Maternal Aunt    Non-Hodgkin's lymphoma Paternal Aunt    Ovarian  cancer Paternal Aunt    Stomach cancer Paternal Aunt    Colon cancer Neg Hx    Esophageal cancer Neg Hx    Rectal cancer Neg Hx    Crohn's disease Neg Hx    Ulcerative colitis Neg Hx     SOCIAL HISTORY: Social History   Socioeconomic History   Marital status: Married    Spouse name: Not on file   Number of children: 2   Years of education: Not on file   Highest education level: Not on file  Occupational History   Not on file  Tobacco Use   Smoking status: Former    Packs/day: 0.25    Types: Cigarettes    Start date: 05/21/1993   Smokeless tobacco: Never  Vaping Use   Vaping Use: Some days   Substances: THC  Substance and Sexual Activity   Alcohol use: Yes    Comment: rare   Drug use: Yes    Types: Marijuana    Comment: smoked maraijuana last night 08/12/22   Sexual activity: Yes    Birth control/protection: None  Other Topics Concern   Not on file  Social History Narrative   Not on file   Social Determinants of Health   Financial Resource Strain: Not on file  Food Insecurity: Not on file  Transportation Needs: Not on file  Physical Activity: Not on file  Stress: Not on file  Social Connections: Not on file  Intimate Partner Violence: Not on file      Marcial Pacas, M.D. Ph.D.  [facility] [address], [place], [place] [address] Ph: [phone] Fax: [phone]  CC:  [name], Randa Evens, PA-C [place],  [place] [address]  [name], Randa Evens, PA-C

## 2022-09-11 NOTE — Procedures (Signed)
Full Name: Bryan Hull Gender: Male MRN #: [id number] Date of Birth: 09/22/1975    Visit Date: 09/11/2022 10:33 Age: 48 Years Examining Physician: Dr. Marcial Pacas Referring Physician: Dr. Marcial Pacas Height: 6 feet 2 inch History: 47 year old male with poorly controlled diabetes presenting with bilateral feet paresthesia  Summary of the test:  Nerve conduction study:  Bilateral sural, superficial peroneal sensory response was absent.  Bilateral median, ulnar, radial sensory responses were normal.  Bilateral tibial motor responses showed significantly decreased CMAP amplitude.  Bilateral peroneal to EDB motor response showed mild to moderately decreased CMAP amplitude.  Bilateral median motor responses were normal.  Bilateral ulnar motor responses showed borderline or mildly decreased CMAP amplitude, with normal conduction velocity, and F-wave latency.  Electromyography: Selected needle examination of right lower extremity muscles were normal.  Conclusion: This is an abnormal study.  There is electrodiagnostic evidence of length-dependent moderate axonal sensorimotor polyneuropathy, most consistent with his poorly controlled diabetes.   ------------------------------- Marcial Pacas, M.D.Ph.D.  [facility] [address] [place], [place] [address] Tel: [phone] Fax: [phone]  Verbal informed consent was obtained from the patient, patient was informed of potential risk of procedure, including bruising, bleeding, hematoma formation, infection, muscle weakness, muscle pain, numbness, among others.        [place]    Nerve / Sites Muscle Latency Ref. Amplitude Ref. Rel Amp Segments Distance Velocity Ref. Area    ms ms mV mV %  cm m/s m/s mVms  R Median - APB     Wrist APB 3.7 ?4.4 7.3 ?4.0 100 Wrist - APB 7   31.6     Upper arm APB 9.3  6.4  87.4 Upper arm - Wrist 29 52 ?49 28.9  L Median - APB     Wrist APB 3.8 ?4.4 7.7 ?4.0 100 Wrist - APB 7    31.4     Upper arm APB 9.3  10.0  129 Upper arm - Wrist 29 53 ?49 40.2  R Ulnar - ADM     Wrist ADM 3.9 ?3.3 5.5 ?6.0 100 Wrist - ADM 7   17.0     B.Elbow ADM 6.1  6.3  115 B.Elbow - Wrist 15 70 ?49 20.2     A.Elbow ADM 9.6  6.2  98.4 A.Elbow - B.Elbow 17 49 ?49 21.3  L Ulnar - ADM     Wrist ADM 3.2 ?3.3 5.1 ?6.0 100 Wrist - ADM 7   19.8     B.Elbow ADM 6.0  5.1  98.3 B.Elbow - Wrist 15 55 ?49 21.0     A.Elbow ADM 8.9  5.0  99.5 A.Elbow - B.Elbow 16 54 ?49 21.1  R Peroneal - EDB     Ankle EDB 6.5 ?6.5 0.6 ?2.0 100 Ankle - EDB 9   2.2     Fib head EDB 12.8  0.1  13.3 Fib head - Ankle 25 40 ?44 0.2     Pop fossa EDB 17.0  0.3  407 Pop fossa - Fib head 12.5 30 ?44 1.9         Pop fossa - Ankle      L Peroneal - EDB     Ankle EDB 6.5 ?6.5 1.7 ?2.0 100 Ankle - EDB 9   6.3     Fib head EDB 15.4  1.0  58.1 Fib head - Ankle 30 34 ?44 3.4     Pop fossa EDB 21.8  0.9  91.8 Pop fossa - Fib head 13 20 ?44 2.7         Pop fossa - Ankle      R Tibial - AH     Ankle AH 7.0 ?5.8 1.5 ?4.0 100 Ankle - AH 9   3.2     Pop fossa AH 21.3  1.1  76.1 Pop fossa - Ankle 45 31 ?41 3.8  L Tibial - AH     Ankle AH 7.5 ?5.8 0.6 ?4.0 100 Ankle - AH 9   1.2     Pop fossa AH 23.5  0.4  65.8 Pop fossa - Ankle 46 29 ?41 1.0                     SNC    Nerve / Sites Rec. Site Peak Lat Ref.  Amp Ref. Segments Distance    ms ms V V  cm  L Radial - Anatomical snuff box (Forearm)     Forearm Wrist 2.7 ?2.9 30 ?15 Forearm - Wrist 10  R Radial - Anatomical snuff box (Forearm)     Forearm Wrist 2.5 ?2.9 16 ?15 Forearm - Wrist 10  R Sural - Ankle (Calf)     Calf Ankle NR ?4.4 NR ?6 Calf - Ankle 14  L Sural - Ankle (Calf)     Calf Ankle NR ?4.4 NR ?6 Calf - Ankle 14  R Superficial peroneal - Ankle     Lat leg Ankle NR ?4.4 NR ?6 Lat leg - Ankle 14  L Superficial peroneal - Ankle     Lat leg Ankle NR ?4.4 NR ?6 Lat leg - Ankle 14  R Median - Orthodromic (Dig II, Mid palm)     Dig II Wrist 3.0 ?3.4 11 ?10 Dig II -  Wrist 13  L Median - Orthodromic (Dig II, Mid palm)     Dig II Wrist 3.3 ?3.4 13 ?10 Dig II - Wrist 13  R Ulnar - Orthodromic, (Dig V, Mid palm)     Dig V Wrist 3.1 ?3.1 6 ?5 Dig V - Wrist 11  L Ulnar - Orthodromic, (Dig V, Mid palm)     Dig V Wrist 3.1 ?3.1 5 ?5 Dig V - Wrist 66                         F  Wave    Nerve F Lat Ref.   ms ms  R Ulnar - ADM 32.0 ?32.0  L Ulnar - ADM 31.8 ?32.0  R Tibial - AH 77.1 ?56.0  L Tibial - AH 78.6 ?56.0             EMG Summary Table    Spontaneous MUAP Recruitment  Muscle IA Fib PSW Fasc Other Amp Dur. Poly Pattern  R. Tibialis anterior Normal None None None _______ Normal Normal Normal Normal  R. Tibialis posterior Normal None None None _______ Normal Normal Normal Normal  R. Peroneus longus Normal None None None _______ Normal Normal Normal Normal  R. Gastrocnemius (Medial head) Normal None None None _______ Normal Normal Normal Normal  R. Lumbar paraspinals Normal None None None _______ Normal Normal Normal Normal

## 2022-10-09 ENCOUNTER — Other Ambulatory Visit: Payer: Self-pay | Admitting: Physician Assistant

## 2022-10-09 LAB — HM DIABETES EYE EXAM

## 2022-11-18 ENCOUNTER — Encounter: Payer: Self-pay | Admitting: Physician Assistant

## 2022-11-18 ENCOUNTER — Ambulatory Visit (INDEPENDENT_AMBULATORY_CARE_PROVIDER_SITE_OTHER): Payer: 59 | Admitting: Physician Assistant

## 2022-11-18 VITALS — BP 130/86 | HR 82 | Temp 97.8°F | Ht 74.0 in | Wt 227.2 lb

## 2022-11-18 DIAGNOSIS — G629 Polyneuropathy, unspecified: Secondary | ICD-10-CM

## 2022-11-18 DIAGNOSIS — E1140 Type 2 diabetes mellitus with diabetic neuropathy, unspecified: Secondary | ICD-10-CM | POA: Diagnosis not present

## 2022-11-18 DIAGNOSIS — I7409 Other arterial embolism and thrombosis of abdominal aorta: Secondary | ICD-10-CM

## 2022-11-18 DIAGNOSIS — D6851 Activated protein C resistance: Secondary | ICD-10-CM | POA: Diagnosis not present

## 2022-11-18 DIAGNOSIS — L03032 Cellulitis of left toe: Secondary | ICD-10-CM

## 2022-11-18 DIAGNOSIS — S90422A Blister (nonthermal), left great toe, initial encounter: Secondary | ICD-10-CM

## 2022-11-18 DIAGNOSIS — S90425A Blister (nonthermal), left lesser toe(s), initial encounter: Secondary | ICD-10-CM

## 2022-11-18 LAB — POCT GLYCOSYLATED HEMOGLOBIN (HGB A1C): Hemoglobin A1C: 7 % — AB (ref 4.0–5.6)

## 2022-11-18 MED ORDER — GABAPENTIN 300 MG PO CAPS: 300.0000 mg | ORAL_CAPSULE | Freq: Every day | ORAL | 0 refills | Status: DC

## 2022-11-18 MED ORDER — METFORMIN HCL ER 500 MG PO TB24: 500.0000 mg | ORAL_TABLET | Freq: Two times a day (BID) | ORAL | 2 refills | Status: DC

## 2022-11-18 MED ORDER — AMOXICILLIN-POT CLAVULANATE 875-125 MG PO TABS: 1.0000 | ORAL_TABLET | Freq: Two times a day (BID) | ORAL | 0 refills | Status: AC

## 2022-11-18 NOTE — Patient Instructions (Addendum)
Samples of Xarelto 15 mg provided - take one tablet twice daily until your appointment this Friday.  IF any sudden severe symptoms such as chest pain, short of breath, pain or swelling in calf, go to Emergency dept immediately.  Appt with Dr. Karin Lieu office on Friday @ 1:40 pm   Start back on Metformin. Keep working on nutrition.   Gabapentin 300 mg in the evenings for nerve pain.   Augmentin as directed to help with possible developing cellulitis.  MONITOR YOUR FEET DAILY.

## 2022-11-18 NOTE — Assessment & Plan Note (Signed)
Start gabapentin 300 mg qhs  Pt aware of risks vs benefits and possible adverse reactions F/up in 2 weeks on this or sooner prn

## 2022-11-18 NOTE — Assessment & Plan Note (Signed)
Lab Results  Component Value Date   HGBA1C 7.0 (A) 11/18/2022   HGBA1C 6.0 (A) 05/31/2022   HGBA1C 6.0 (A) 05/03/2022   Start back on Metformin XR 500 mg one tab BID Careful with food choices

## 2022-11-18 NOTE — Assessment & Plan Note (Addendum)
Gave samples of Xarelto 15 mg to take 1 tab twice daily until appt with [facility] this Friday. DOUBT DVT as he does not have any pain, swelling, or redness in the left lower leg and just complains of stiffness (also went hiking this weekend); but out of an abundance of caution, as well as pt declining any imaging today.   Currently only taking ASA 81 mg.  Plan note from hematology from 05/20/22:  I also explained to the patient that there is no need for lifelong anticoagulation unless he has recurrent deep venous thrombosis or blood clots. The patient had recent vascular surgery and he is currently on anticoagulation with Eliquis 5 mg p.o. twice daily.  I recommended for the patient to continue on this treatment for at least 2 more months and then he can switch to aspirin 81 mg p.o. daily after that.

## 2022-11-18 NOTE — Progress Notes (Signed)
Subjective:    Patient ID: Bryan Hull, male    DOB: 26-Dec-1975, 47 y.o.   MRN: [id number]  Chief Complaint  Patient presents with   Leg Pain    Pt in office in regards to same feeling he had previously in hips when having blockage; pt has little feeling in feet but also knows he can feel blister on left foot especially when stepping to walk, pt experiencing cramps like previously as well;     Leg Pain    Patient is in today for leg and feet concerns. Hx PAD, aortoiliac occlusive disease, Factor V Leiden, T2DM.   Toes are numb, progressively worsening in the last few months.  Somewhat numb in right foot / toes, but nothing like the left foot.   Starting to feel pain, cramps, in bilateral hips, & feels tightness in his left calf. No swelling. No CP or SOB. No redness. Started in the last few weeks. Makes him nervous because states these are symptoms prior to his bilateral femoral artery bypass surgery.   Blisters x 2 weeks left foot - one by pinky toe and one at base of R great toe. Can't really tell if painful or not, just notices something's going on when he's walking. Thinks the great toe is looking better. Still some weeping / oozing from left toe lesion.   Past Medical History:  Diagnosis Date   Asthma    as a child/teenager   Blood transfusion without reported diagnosis    SEPTEMBER   Clotting disorder    FACTOR V   Diabetes mellitus without complication    Factor V Leiden    Family history of factor V Leiden mutation    Neuromuscular disorder    NEUROPATHY   Peripheral vascular disease    PAD    Past Surgical History:  Procedure Laterality Date   AORTA - BILATERAL FEMORAL ARTERY BYPASS GRAFT N/A 04/22/2022   Procedure: AORTOBIFEMORAL BYPASS GRAFT;  Surgeon: Victorino Sparrow, MD;  Location: [facility] OR;  Service: Vascular;  Laterality: N/A;   APPENDECTOMY     COLONOSCOPY     COLONOSCOPY WITH PROPOFOL N/A 08/27/2022   Procedure: COLONOSCOPY WITH PROPOFOL;  Surgeon:  Sherrilyn Rist, MD;  Location: [facility];  Service: Gastroenterology;  Laterality: N/A;   ENDOSCOPIC MUCOSAL RESECTION  08/27/2022   Procedure: ENDOSCOPIC MUCOSAL RESECTION;  Surgeon: Sherrilyn Rist, MD;  Location: [facility];  Service: Gastroenterology;;   HEMOSTASIS CLIP PLACEMENT  08/27/2022   Procedure: HEMOSTASIS CLIP PLACEMENT;  Surgeon: Sherrilyn Rist, MD;  Location[facility];  Service: Gastroenterology;;   POLYPECTOMY  08/27/2022   Procedure: POLYPECTOMY;  Surgeon: Sherrilyn Rist, MD;  [facility];  Service: Gastroenterology;;   SUBMUCOSAL LIFTING INJECTION  08/27/2022   Procedure: SUBMUCOSAL LIFTING INJECTION;  Surgeon: Sherrilyn Rist, MD;  Location: [facility];  Service: Gastroenterology;;   WISDOM TOOTH EXTRACTION      Family History  Problem Relation Age of Onset   Colon polyps Mother    Factor V Leiden deficiency Mother    Fibromyalgia Mother    Diabetes Father    Hyperlipidemia Father    Heart attack Father        threw a blood clot when kidney's shut down   Breast cancer Maternal Aunt    Non-Hodgkin's lymphoma Paternal Aunt    Ovarian cancer Paternal Aunt    Stomach cancer Paternal Aunt    Colon cancer Neg Hx  Esophageal cancer Neg Hx    Rectal cancer Neg Hx    Crohn's disease Neg Hx    Ulcerative colitis Neg Hx     Social History   Tobacco Use   Smoking status: Former    Packs/day: .25    Types: Cigarettes    Start date: 05/21/1993   Smokeless tobacco: Never  Vaping Use   Vaping Use: Some days   Substances: THC  Substance Use Topics   Alcohol use: Yes    Comment: rare   Drug use: Yes    Types: Marijuana    Comment: smoked maraijuana last night 08/12/22     No Known Allergies  Review of Systems NEGATIVE UNLESS OTHERWISE INDICATED IN HPI      Objective:     BP 130/86 (BP Location: Left Arm)   Pulse 82   Temp 97.8 F (36.6 C) (Temporal)   Ht  (1.88 m)   Wt 227 lb 3.2 oz (103.1 kg)   SpO2 98%    BMI 29.17 kg/m   Wt Readings from Last 3 Encounters:  11/18/22 227 lb 3.2 oz (103.1 kg)  09/11/22 216 lb (98 kg)  08/27/22 215 lb (97.5 kg)    BP Readings from Last 3 Encounters:  11/18/22 130/86  09/11/22 111/77  08/27/22 136/88     Physical Exam Constitutional:      Appearance: Normal appearance.  Cardiovascular:     Rate and Rhythm: Normal rate and regular rhythm.     Pulses: Normal pulses.          Dorsalis pedis pulses are 2+ on the left side.       Posterior tibial pulses are 2+ on the left side.     Heart sounds: No murmur heard. Pulmonary:     Effort: Pulmonary effort is normal.     Breath sounds: Normal breath sounds.  Musculoskeletal:        General: No swelling or tenderness.     Right lower leg: No edema.     Left lower leg: No edema.     Comments: Homan's sign neg LLE No redness or swelling LLE  Neurological:     General: No focal deficit present.     Mental Status: He is alert and oriented to person, place, and time.     Sensory: Sensory deficit (monofilament sensation absent entire left foot below ankle) present.  Psychiatric:        Mood and Affect: Mood normal.        Behavior: Behavior normal.        Assessment & Plan:  Type 2 diabetes mellitus with diabetic neuropathy, without long-term current use of insulin Assessment & Plan: Lab Results  Component Value Date   HGBA1C 7.0 (A) 11/18/2022   HGBA1C 6.0 (A) 05/31/2022   HGBA1C 6.0 (A) 05/03/2022   Start back on Metformin XR 500 mg one tab BID Careful with food choices  Orders: -     POCT glycosylated hemoglobin (Hb A1C)  Neuropathy Assessment & Plan: Start gabapentin 300 mg qhs  Pt aware of risks vs benefits and possible adverse reactions F/up in 2 weeks on this or sooner prn    Factor V Leiden Assessment & Plan: Gave samples of Xarelto 15 mg to take 1 tab twice daily until appt with [facility] this Friday. DOUBT DVT as he does not have any pain, swelling, or redness in the left lower leg  and just complains of stiffness (also went hiking this weekend); but out  of an abundance of caution, as well as pt declining any imaging today.   Currently only taking ASA 81 mg.  Plan note from hematology from 05/20/22:  I also explained to the patient that there is no need for lifelong anticoagulation unless he has recurrent deep venous thrombosis or blood clots. The patient had recent vascular surgery and he is currently on anticoagulation with Eliquis 5 mg p.o. twice daily.  I recommended for the patient to continue on this treatment for at least 2 more months and then he can switch to aspirin 81 mg p.o. daily after that.   Aortoiliac occlusive disease Assessment & Plan: Follows with Dr. Karin Lieu    Cellulitis of left toe  Blister of fifth toe of left foot, initial encounter  Blister of left great toe, initial encounter  Other orders -     Amoxicillin-Pot Clavulanate; Take 1 tablet by mouth 2 (two) times daily for 7 days. Take with food.  Dispense: 14 tablet; Refill: 0 -     metFORMIN HCl ER; Take 1 tablet (500 mg total) by mouth 2 (two) times daily with a meal.  Dispense: 60 tablet; Refill: 2 -     Gabapentin; Take 1 capsule (300 mg total) by mouth at bedtime for 15 days. Take for nerve pain.  Dispense: 15 capsule; Refill: 0   Augmentin as directed to help with possible developing cellulitis left little toe. Will monitor area closely. Keep cushion bandages over the sores to help healing.    ED PRECAUTIONS DISCUSSED. PT UNDERSTANDING AND AGREEABLE.   Return in about 2 weeks (around 12/02/2022) for recheck/follow-up.     Quientin Jent M Terrika Zuver, PA-C

## 2022-11-18 NOTE — Assessment & Plan Note (Signed)
Follows with Dr. Karin Lieu

## 2022-11-22 ENCOUNTER — Encounter: Payer: Self-pay | Admitting: Vascular Surgery

## 2022-11-22 ENCOUNTER — Ambulatory Visit (INDEPENDENT_AMBULATORY_CARE_PROVIDER_SITE_OTHER): Payer: Managed Care, Other (non HMO) | Admitting: Vascular Surgery

## 2022-11-22 VITALS — BP 121/81 | HR 74 | Temp 97.9°F | Resp 20 | Ht 74.0 in | Wt 229.0 lb

## 2022-11-22 DIAGNOSIS — Z95828 Presence of other vascular implants and grafts: Secondary | ICD-10-CM

## 2022-11-22 DIAGNOSIS — I739 Peripheral vascular disease, unspecified: Secondary | ICD-10-CM

## 2022-11-22 MED ORDER — APIXABAN 5 MG PO TABS: 5.0000 mg | ORAL_TABLET | Freq: Two times a day (BID) | ORAL | 10 refills | Status: DC

## 2022-11-22 NOTE — Progress Notes (Signed)
OFFICE NOTE   HPI:  This is a 47 y.o. male who is s/p end to end ABF with inferior mesenteric vein ligation and right CFA endarterectomy on 04/22/2022 by Dr. Karin Lieu for LLE CLI with rest pain.    He had palpable DP pulses bilaterally.  He was started on heparin 500U on the day of surgery due to factor V leiden trait and was started on Eliquis prior to discharge home.    On exam today, Bryan Hull was doing well.  He presents with concern for new onset claudication symptoms that are present bilaterally ( L>R) which occur after roughly 300 yards of ambulation.  He first appreciated this 2 weeks ago while at a car show and noted it was the first time since surgery he has had claudication.  Pressure he appreciated was not strong enough to affect his ambulation, but made him nervous.  He asked for a visit today to ensure his bypass graft was patent. Denies rest pain.  He has 2 lesions on the left foot which have an eschar, and been present for roughly 2 weeks.  He states he still has some numbness in his feet (left worse than right) but the numbness and pain in his hips and lower legs are improved.  His appetite is okay.  He has quit smoking cigarettes since the day before surgery.   No Known Allergies  Current Outpatient Medications  Medication Sig Dispense Refill   amoxicillin-clavulanate (AUGMENTIN) 875-125 MG tablet Take 1 tablet by mouth 2 (two) times daily for 7 days. Take with food. 14 tablet 0   aspirin EC 81 MG tablet Take 1 tablet (81 mg total) by mouth daily. Swallow whole. 30 tablet 0   atorvastatin (LIPITOR) 40 MG tablet Take 1 tablet (40 mg total) by mouth daily. 90 tablet 3   Blood Glucose Monitoring Suppl (FREESTYLE LITE) w/Device KIT Please use as directed to check sugar twice daily. 1 kit 2   FREESTYLE LITE test strip 1 each by Other route 2 (two) times daily. 200 each 1   gabapentin (NEURONTIN) 300 MG capsule Take 1 capsule (300 mg total) by mouth at bedtime for 15 days. Take for nerve  pain. 15 capsule 0   Lancets (FREESTYLE) lancets      metFORMIN (GLUCOPHAGE-XR) 500 MG 24 hr tablet Take 1 tablet (500 mg total) by mouth 2 (two) times daily with a meal. 60 tablet 2   metoCLOPramide (REGLAN) 5 MG tablet TAKE 30 MINUTES PRIOR TO EACH PRE3P DOSE 2 tablet 0   mirtazapine (REMERON) 15 MG tablet TAKE ONE TABLET BY MOUTH AT BEDTIME 90 tablet 0   Current Facility-Administered Medications  Medication Dose Route Frequency Provider Last Rate Last Admin   0.9 %  sodium chloride infusion  500 mL Intravenous Once Charlie Pitter III, MD         ROS:  See HPI  Physical Exam:  Today's Vitals   11/22/22 1346  BP: 121/81  Pulse: 74  Resp: 20  Temp: 97.9 F (36.6 C)  SpO2: 95%  Weight: 229 lb (103.9 kg)  Height: 6\' 2"  (1.88 m)   Body mass index is 29.4 kg/m.   Incision:  bilateral groins and laparotomy incisions are healing nicely. Extremities:  palpable femorals, palpable right DP/PT and palpable left DP; bilateral feet are warm Neuro: in tact Abdomen:  soft, NT/ND    Assessment/Plan:  This is a 47 y.o. male who is s/p: end to end ABF with inferior mesenteric vein ligation and  right CFA endarterectomy on 04/22/2022 by Dr. Karin Lieu for LLE CLI with rest pain.   I recently appreciated mild hip discomfort (L>R), which occurred after roughly 300 yards of ambulation.  This occurred at a car show and noted mostly when he was walking up hills.   On physical exam, he had palpable femoral pulses, and palpable pulses in the feet.  I have ordered an ABI, as well as aortobifemoral duplex ultrasound to ensure there is no stenosis at his distal anastomoses which could lead to claudication symptoms.  I asked that he continue his current medication regimen.  I plan to call him with the results of the study. No restrictions at this time.  I asked that he continue walking as much as possible.  Victorino Sparrow MD

## 2022-11-22 NOTE — Addendum Note (Signed)
Addended by: Gerarda Fraction E on: 11/22/2022 02:11 PM   Modules accepted: Orders

## 2022-12-01 ENCOUNTER — Emergency Department: Admit: 2022-12-01 | Payer: BLUE CROSS/BLUE SHIELD

## 2022-12-01 ENCOUNTER — Inpatient Hospital Stay
Admit: 2022-12-01 | Discharge: 2022-12-01 | Disposition: A | Discharge status: Home / Self Care | Payer: BLUE CROSS/BLUE SHIELD | Attending: Emergency Medicine

## 2022-12-01 DIAGNOSIS — S39012A Strain of muscle, fascia and tendon of lower back, initial encounter: Secondary | ICD-10-CM

## 2022-12-01 MED ORDER — CYCLOBENZAPRINE HCL 10 MG PO TABS
10 | ORAL | Status: AC
Start: 2022-12-01 — End: 2022-12-01
  Administered 2022-12-01: 12:00:00 10 mg via ORAL

## 2022-12-01 MED ORDER — OXYCODONE-ACETAMINOPHEN 5-325 MG PO TABS
5-325 | ORAL | Status: AC
Start: 2022-12-01 — End: 2022-12-01
  Administered 2022-12-01: 12:00:00 1 via ORAL

## 2022-12-01 MED ORDER — NAPROXEN 500 MG PO TABS
500 | ORAL_TABLET | Freq: Two times a day (BID) | ORAL | 0 refills | Status: AC
Start: 2022-12-01 — End: ?

## 2022-12-01 MED ORDER — KETOROLAC TROMETHAMINE 30 MG/ML IJ SOLN
30 | INTRAMUSCULAR | Status: AC
Start: 2022-12-01 — End: 2022-12-01
  Administered 2022-12-01: 12:00:00 30 mg via INTRAMUSCULAR

## 2022-12-01 MED ORDER — METHOCARBAMOL 750 MG PO TABS
750 | ORAL_TABLET | Freq: Four times a day (QID) | ORAL | 0 refills | Status: AC
Start: 2022-12-01 — End: 2022-12-11

## 2022-12-01 MED FILL — CYCLOBENZAPRINE HCL 10 MG PO TABS: 10 MG | ORAL | Qty: 1

## 2022-12-01 MED FILL — OXYCODONE-ACETAMINOPHEN 5-325 MG PO TABS: 5-325 MG | ORAL | Qty: 1

## 2022-12-01 MED FILL — KETOROLAC TROMETHAMINE 30 MG/ML IJ SOLN: 30 MG/ML | INTRAMUSCULAR | Qty: 1

## 2022-12-01 NOTE — ED Provider Notes (Signed)
[facility] EMERGENCY DEPT  EMERGENCY DEPARTMENT ENCOUNTER      Pt Name: Corey Shah  MRN: [id number]  Birthdate [birth year]  Date of evaluation: 12/01/2022  Provider: Unknown Jim, MD  7:29 AM    CHIEF COMPLAINT       Chief Complaint   Patient presents with    Back Pain         HISTORY OF PRESENT ILLNESS    Corey Shah is a 47 y.o. male who presents to the emergency department with complaint of worsening low back pain for 1 week with spasm. Painful to stand or sit . Pain 8/10 . No history of trauma.       HPI    Nursing Notes were reviewed.    REVIEW OF SYSTEMS       Review of Systems   Constitutional: Negative.    HENT: Negative.     Eyes: Negative.    Respiratory: Negative.     Cardiovascular: Negative.  Negative for chest pain.   Gastrointestinal: Negative.    Endocrine: Negative.    Genitourinary: Negative.    Musculoskeletal:  Positive for back pain.   Skin: Negative.    Allergic/Immunologic: Negative.    Neurological: Negative.  Negative for weakness.   Hematological: Negative.    Psychiatric/Behavioral: Negative.  Negative for behavioral problems.    All other systems reviewed and are negative.      Except as noted above the remainder of the review of systems was reviewed and negative.       PAST MEDICAL HISTORY   History reviewed. No pertinent past medical history.      SURGICAL HISTORY     History reviewed. No pertinent surgical history.      CURRENT MEDICATIONS       Previous Medications    No medications on file       ALLERGIES     Patient has no known allergies.    FAMILY HISTORY     History reviewed. No pertinent family history.       SOCIAL HISTORY       Social History     Socioeconomic History    Marital status: Married     Spouse name: None    Number of children: None    Years of education: None    Highest education level: None       SCREENINGS         Glasgow Coma Scale  Eye Opening: Spontaneous  Best Verbal Response: Oriented  Best Motor Response: Obeys commands  Glasgow Coma Scale Score: 15                      CIWA Assessment  BP: (!) 128/97  Pulse: 89                 PHYSICAL EXAM       ED Triage Vitals [12/01/22 0720]   BP Temp Temp src Pulse Respirations SpO2 Height Weight - Scale   (!) 128/97 98.7 F (37.1 C) -- 89 18 98 % 1.727 m (5\' 8" ) 104.3 kg (230 lb)       Physical Exam  Vitals and nursing note reviewed.   HENT:      Head: Normocephalic and atraumatic.      Nose: Nose normal.   Eyes:      Extraocular Movements: Extraocular movements intact.      Pupils: Pupils are equal, round, and reactive to light.   Cardiovascular:  Rate and Rhythm: Normal rate and regular rhythm.   Pulmonary:      Breath sounds: Normal breath sounds.   Abdominal:      General: Abdomen is flat. Bowel sounds are normal.      Palpations: Abdomen is soft.   Musculoskeletal:         General: Tenderness present. Normal range of motion.      Cervical back: Normal range of motion and neck supple.      Comments: Lower back tender with spasm and decreased ROM.     Skin:     General: Skin is warm.   Neurological:      General: No focal deficit present.      Mental Status: He is alert.   Psychiatric:         Mood and Affect: Mood normal.         DIAGNOSTIC RESULTS     EKG: All EKG's are interpreted by the Emergency Department Physician who either signs or Co-signs this chart in the absence of a cardiologist.        RADIOLOGY:   Non-plain film images such as CT, Ultrasound and MRI are read by the radiologist. Plain radiographic images are visualized and preliminarily interpreted by the emergency physician with the below findings:        Interpretation per the Radiologist below, if available at the time of this note:    CT LUMBAR SPINE WO CONTRAST    (Results Pending)         ED BEDSIDE ULTRASOUND:   Performed by ED Physician - none    LABS:  Labs Reviewed - No data to display    All other labs were within normal range or not returned as of this dictation.    EMERGENCY DEPARTMENT COURSE and DIFFERENTIAL DIAGNOSIS/MDM:   Vitals:    Vitals:     12/01/22 0720   BP: (!) 128/97   Pulse: 89   Resp: 18   Temp: 98.7 F (37.1 C)   SpO2: 98%   Weight: 104.3 kg (230 lb)   Height: 1.727 m (5\' 8" )           Medical Decision Making  Amount and/or Complexity of Data Reviewed  Radiology: ordered.    Risk  Prescription drug management.            REASSESSMENT          CRITICAL CARE TIME   Total Critical Care time was 0 minutes, excluding separately reportable procedures.  There was a high probability of clinically significant/life threatening deterioration in the patient's condition which required my urgent intervention.      CONSULTS:  None    PROCEDURES:  Unless otherwise noted below, none     Procedures        FINAL IMPRESSION    No diagnosis found.      DISPOSITION/PLAN   DISPOSITION        PATIENT REFERRED TO:  No follow-up provider specified.    DISCHARGE MEDICATIONS:  New Prescriptions    No medications on file     Controlled Substances Monitoring:          No data to display                (Please note that portions of this note were completed with a voice recognition program.  Efforts were made to edit the dictations but occasionally words are mis-transcribed.)    Brain Sherilyn Cooter, MD (electronically signed)  Attending Emergency  Physician           Modena Jansky, MD  12/01/22 [phone]

## 2022-12-01 NOTE — ED Triage Notes (Signed)
Pt reports back pain ongoing for a week that worsened over night. Pt rates pain at 8/10. Denies any injury.

## 2022-12-02 ENCOUNTER — Other Ambulatory Visit: Payer: Self-pay

## 2022-12-02 DIAGNOSIS — I70222 Atherosclerosis of native arteries of extremities with rest pain, left leg: Secondary | ICD-10-CM

## 2022-12-02 DIAGNOSIS — I739 Peripheral vascular disease, unspecified: Secondary | ICD-10-CM

## 2022-12-03 ENCOUNTER — Telehealth: Payer: Managed Care, Other (non HMO) | Admitting: Physician Assistant

## 2022-12-03 ENCOUNTER — Encounter: Payer: Self-pay | Admitting: Physician Assistant

## 2022-12-03 VITALS — Ht 74.0 in | Wt 229.0 lb

## 2022-12-03 DIAGNOSIS — G629 Polyneuropathy, unspecified: Secondary | ICD-10-CM | POA: Diagnosis not present

## 2022-12-03 DIAGNOSIS — D6851 Activated protein C resistance: Secondary | ICD-10-CM | POA: Diagnosis not present

## 2022-12-03 DIAGNOSIS — E1140 Type 2 diabetes mellitus with diabetic neuropathy, unspecified: Secondary | ICD-10-CM | POA: Diagnosis not present

## 2022-12-03 DIAGNOSIS — I7409 Other arterial embolism and thrombosis of abdominal aorta: Secondary | ICD-10-CM | POA: Diagnosis not present

## 2022-12-03 DIAGNOSIS — M25551 Pain in right hip: Secondary | ICD-10-CM

## 2022-12-03 DIAGNOSIS — Z7984 Long term (current) use of oral hypoglycemic drugs: Secondary | ICD-10-CM

## 2022-12-03 DIAGNOSIS — M25552 Pain in left hip: Secondary | ICD-10-CM

## 2022-12-03 MED ORDER — GABAPENTIN 300 MG PO CAPS: 300.0000 mg | ORAL_CAPSULE | Freq: Two times a day (BID) | ORAL | 2 refills | Status: DC

## 2022-12-03 NOTE — Patient Instructions (Addendum)
Plan: STOP Lipitor (atorvastatin) at this time - need to see if any pain is coming from statin-myopathy  Increase Gabapentin to 300 mg twice daily  Keep on the Eliquis as directed by Dr. Prentiss Bells see what your vascular scans show  Keep follow up with me as scheduled on the 31st

## 2022-12-03 NOTE — Progress Notes (Signed)
   Virtual Visit via Video Note  I connected with  Bryan Hull  on 12/03/22 at 10:00 AM EDT by a video enabled telemedicine application and verified that I am speaking with the correct person using two identifiers.  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 The patient expressed understanding and agreed to proceed.   History of Present Illness:  47 yo male presenting for VV recheck from last visit on 11/18/22.   Noticing improvement with Gabapentin 300 mg in regard to his neuropathic pain. Denies any side effects. Willing to increase dose as he's still getting stinging pain intermittently.  He did have visit with V&V, Dr. Karin Lieu - scheduled in a few weeks to have ultrasound imaging repeated. He is taking Eliquis 5 mg BID.   Still having pain as described last visit in bilateral hips and tightness in calves.    Observations/Objective:   Gen: Awake, alert, no acute distress Resp: Breathing is even and non-labored Psych: calm/pleasant demeanor Neuro: Alert and Oriented x 3, + facial symmetry, speech is clear.   Assessment and Plan:  Problem List Items Addressed This Visit       Cardiovascular and Mediastinum   Aortoiliac occlusive disease (HCC)     Endocrine   Type 2 diabetes mellitus with diabetic neuropathy, without long-term current use of insulin (HCC) - Primary     Nervous and Auditory   Neuropathy     Hematopoietic and Hemostatic   Factor V Leiden (HCC)   Other Visit Diagnoses     Bilateral hip pain          Plan: STOP Lipitor (atorvastatin) at this time - need to see if any pain is coming from statin-myopathy  Increase Gabapentin to 300 mg twice daily  Keep on the Eliquis as directed by Dr. Prentiss Bells see what your vascular scans  show  Keep follow up with me as scheduled on the 31st   Follow Up Instructions:    I discussed the assessment and treatment plan with the patient. The patient was provided an opportunity to ask questions and all were answered. The patient agreed with the plan and demonstrated an understanding of the instructions.   The patient was advised to call back or seek an in-person evaluation if the symptoms worsen or if the condition fails to improve as anticipated.  Austan Nicholl M Joylyn Duggin, PA-C

## 2022-12-19 NOTE — Progress Notes (Signed)
  OFFICE NOTE   HPI:  This is a 47 y.o. male who is s/p end to end ABF with inferior mesenteric vein ligation and right CFA endarterectomy on 04/22/2022 by Dr. Karin Lieu for LLE CLI with rest pain.    He had palpable DP pulses bilaterally.  He was started on heparin 500U on the day of surgery due to factor V leiden trait and was started on Eliquis prior to discharge home.    At his last clinic visit, Mordechai noted new onset claudication, specifically in the hips-left greater than right which occur after roughly 300 yards of ambulation.    He presents today via phone encounter to discuss recent graft imaging.  I called Ceferino's cell phone from my office on [address] [employer] [address].  Overall, Johnson was doing well.  He had no complaints.  Claudication symptoms in the hips was minimal.  No progression.  Compliant with Eliquis, aspirin  No Known Allergies  Current Outpatient Medications  Medication Sig Dispense Refill   apixaban (ELIQUIS) 5 MG TABS tablet Take 1 tablet (5 mg total) by mouth 2 (two) times daily. 60 tablet 10   aspirin EC 81 MG tablet Take 1 tablet (81 mg total) by mouth daily. Swallow whole. 30 tablet 0   atorvastatin (LIPITOR) 40 MG tablet Take 1 tablet (40 mg total) by mouth daily. 90 tablet 3   Blood Glucose Monitoring Suppl (FREESTYLE LITE) w/Device KIT Please use as directed to check sugar twice daily. 1 kit 2   FREESTYLE LITE test strip 1 each by Other route 2 (two) times daily. 200 each 1   gabapentin (NEURONTIN) 300 MG capsule Take 1 capsule (300 mg total) by mouth 2 (two) times daily. Take for nerve pain. 60 capsule 2   Lancets (FREESTYLE) lancets      metFORMIN (GLUCOPHAGE-XR) 500 MG 24 hr tablet Take 1 tablet (500 mg total) by mouth 2 (two) times daily with a meal. 60 tablet 2   metoCLOPramide (REGLAN) 5 MG tablet TAKE 30 MINUTES PRIOR TO EACH PRE3P DOSE 2 tablet 0   mirtazapine (REMERON) 15 MG tablet TAKE ONE TABLET BY MOUTH AT BEDTIME 90 tablet 0   Current Facility-Administered  Medications  Medication Dose Route Frequency Provider Last Rate Last Admin   0.9 %  sodium chloride infusion  500 mL Intravenous Once Danis, Andreas Blower, MD         ROS:  See HPI  Physical Exam: Deferred due to phone visit    Assessment/Plan:  This is a 47 y.o. male who is s/p: end to end ABF with inferior mesenteric vein ligation and right CFA endarterectomy on 04/22/2022 by Dr. Karin Lieu for LLE CLI with rest pain.   I recently appreciated mild hip discomfort (L>R), which occurred after roughly 300 yards of ambulation.  This occurred at a car show and noted mostly when he was walking up hills.   On physical exam, at his last visit he had palpable femoral pulses, and palpable pulses in the feet.  ABI is normal, with no changes from prior No concern for flow-limiting stenosis within the aortobifemoral bypass graft on the arterial duplex. I asked that he continue his current medication regimen.   My plan is to see him in 1 years time  I spent 6 minutes with the patient via telephone encounter with an additional 5 minutes interpreting imaging.    Victorino Sparrow [facility] Office: [phone]

## 2022-12-20 ENCOUNTER — Ambulatory Visit (HOSPITAL_COMMUNITY)
Admission: RE | Admit: 2022-12-20 | Discharge: 2022-12-20 | Disposition: A | Discharge status: Home / Self Care | Payer: Managed Care, Other (non HMO) | Source: Ambulatory Visit | Attending: Vascular Surgery

## 2022-12-20 ENCOUNTER — Ambulatory Visit: Payer: Managed Care, Other (non HMO) | Admitting: Vascular Surgery

## 2022-12-20 ENCOUNTER — Ambulatory Visit (INDEPENDENT_AMBULATORY_CARE_PROVIDER_SITE_OTHER)
Admission: RE | Admit: 2022-12-20 | Discharge: 2022-12-20 | Disposition: A | Discharge status: Home / Self Care | Payer: Managed Care, Other (non HMO) | Source: Ambulatory Visit | Attending: Vascular Surgery

## 2022-12-20 DIAGNOSIS — I739 Peripheral vascular disease, unspecified: Secondary | ICD-10-CM

## 2022-12-20 DIAGNOSIS — I70222 Atherosclerosis of native arteries of extremities with rest pain, left leg: Secondary | ICD-10-CM | POA: Diagnosis present

## 2022-12-20 DIAGNOSIS — Z95828 Presence of other vascular implants and grafts: Secondary | ICD-10-CM

## 2022-12-20 LAB — VAS US ABI WITH/WO TBI
Left ABI: 1.11
Right ABI: 1.13

## 2022-12-27 ENCOUNTER — Ambulatory Visit (INDEPENDENT_AMBULATORY_CARE_PROVIDER_SITE_OTHER): Payer: Managed Care, Other (non HMO) | Admitting: Physician Assistant

## 2022-12-27 ENCOUNTER — Encounter: Payer: Self-pay | Admitting: Physician Assistant

## 2022-12-27 VITALS — BP 110/78 | HR 76 | Temp 97.8°F | Ht 74.0 in | Wt 224.4 lb

## 2022-12-27 DIAGNOSIS — M25552 Pain in left hip: Secondary | ICD-10-CM

## 2022-12-27 DIAGNOSIS — M25551 Pain in right hip: Secondary | ICD-10-CM

## 2022-12-27 DIAGNOSIS — D6851 Activated protein C resistance: Secondary | ICD-10-CM

## 2022-12-27 DIAGNOSIS — Z7984 Long term (current) use of oral hypoglycemic drugs: Secondary | ICD-10-CM | POA: Diagnosis not present

## 2022-12-27 DIAGNOSIS — E1140 Type 2 diabetes mellitus with diabetic neuropathy, unspecified: Secondary | ICD-10-CM

## 2022-12-27 DIAGNOSIS — Z87891 Personal history of nicotine dependence: Secondary | ICD-10-CM

## 2022-12-27 DIAGNOSIS — G629 Polyneuropathy, unspecified: Secondary | ICD-10-CM

## 2022-12-27 LAB — COMPREHENSIVE METABOLIC PANEL
ALT: 30 U/L (ref 0–53)
AST: 23 U/L (ref 0–37)
Albumin: 4.8 g/dL (ref 3.5–5.2)
Alkaline Phosphatase: 47 U/L (ref 39–117)
BUN: 16 mg/dL (ref 6–23)
CO2: 27 mEq/L (ref 19–32)
Calcium: 9.5 mg/dL (ref 8.4–10.5)
Chloride: 104 mEq/L (ref 96–112)
Creatinine, Ser: 1.03 mg/dL (ref 0.40–1.50)
GFR: 86.21 mL/min (ref >60.00)
Glucose, Bld: 129 mg/dL — ABNORMAL HIGH (ref 70–99)
Potassium: 4.3 mEq/L (ref 3.5–5.1)
Sodium: 138 mEq/L (ref 135–145)
Total Bilirubin: 2.2 mg/dL — ABNORMAL HIGH (ref 0.2–1.2)
Total Protein: 7.4 g/dL (ref 6.0–8.3)

## 2022-12-27 LAB — CBC WITH DIFFERENTIAL/PLATELET
Basophils Absolute: 0.1 10*3/uL (ref 0.0–0.1)
Basophils Relative: 0.9 % (ref 0.0–3.0)
Eosinophils Absolute: 0.3 10*3/uL (ref 0.0–0.7)
Eosinophils Relative: 4 % (ref 0.0–5.0)
HCT: 48.3 % (ref 39.0–52.0)
Hemoglobin: 16.7 g/dL (ref 13.0–17.0)
Lymphocytes Relative: 30.9 % (ref 12.0–46.0)
Lymphs Abs: 2.5 10*3/uL (ref 0.7–4.0)
MCHC: 34.5 g/dL (ref 30.0–36.0)
MCV: 89.7 fl (ref 78.0–100.0)
Monocytes Absolute: 0.9 10*3/uL (ref 0.1–1.0)
Monocytes Relative: 10.7 % (ref 3.0–12.0)
Neutro Abs: 4.3 10*3/uL (ref 1.4–7.7)
Neutrophils Relative %: 53.5 % (ref 43.0–77.0)
Platelets: 184 10*3/uL (ref 150.0–400.0)
RBC: 5.38 Mil/uL (ref 4.22–5.81)
RDW: 13.3 % (ref 11.5–15.5)
WBC: 8.1 10*3/uL (ref 4.0–10.5)

## 2022-12-27 LAB — MAGNESIUM: Magnesium: 2 mg/dL (ref 1.5–2.5)

## 2022-12-27 MED ORDER — ALBUTEROL SULFATE HFA 108 (90 BASE) MCG/ACT IN AERS: 2.0000 | INHALATION_SPRAY | Freq: Four times a day (QID) | RESPIRATORY_TRACT | 2 refills | Status: DC | PRN

## 2022-12-27 MED ORDER — GABAPENTIN 300 MG PO CAPS: 300.0000 mg | ORAL_CAPSULE | Freq: Two times a day (BID) | ORAL | 1 refills | Status: DC

## 2022-12-27 NOTE — Progress Notes (Unsigned)
Subjective:    Patient ID: Bryan Hull, male    DOB: Jan 27, 1976, 47 y.o.   MRN: [id number]  Chief Complaint  Patient presents with   Medical Management of Chronic Issues    Pt in the office to recheck fasting labs and follow up. Pt states he is still having the dull nagging feeling in both hips and left calf. Pt states feels like a blood clot but everything checked out fine with Dr Sherral Hammers and imaging. Pt needs to get medication renewed and trying to get all on the same schedule to pick up at the same time;     HPI Patient is in today for follow-up on chronic concerns.   Past Medical History:  Diagnosis Date   Asthma    as a child/teenager   Blood transfusion without reported diagnosis    SEPTEMBER   Clotting disorder (HCC)    FACTOR V   Diabetes mellitus without complication (HCC)    Factor V Leiden (HCC)    Family history of factor V Leiden mutation    Neuromuscular disorder (HCC)    NEUROPATHY   Peripheral vascular disease (HCC)    PAD    Past Surgical History:  Procedure Laterality Date   AORTA - BILATERAL FEMORAL ARTERY BYPASS GRAFT N/A 04/22/2022   Procedure: AORTOBIFEMORAL BYPASS GRAFT;  Surgeon: Victorino Sparrow, MD;  Location: [facility] OR;  Service: Vascular;  Laterality: N/A;   APPENDECTOMY     COLONOSCOPY     COLONOSCOPY WITH PROPOFOL N/A 08/27/2022   Procedure: COLONOSCOPY WITH PROPOFOL;  Surgeon: Sherrilyn Rist, MD;  Location: [facility];  Service: Gastroenterology;  Laterality: N/A;   ENDOSCOPIC MUCOSAL RESECTION  08/27/2022   Procedure: ENDOSCOPIC MUCOSAL RESECTION;  Surgeon: Sherrilyn Rist, MD;  Location: [facility];  Service: Gastroenterology;;   HEMOSTASIS CLIP PLACEMENT  08/27/2022   Procedure: HEMOSTASIS CLIP PLACEMENT;  Surgeon: Sherrilyn Rist, MD;  Location: [facility];  Service: Gastroenterology;;   POLYPECTOMY  08/27/2022   Procedure: POLYPECTOMY;  Surgeon: Sherrilyn Rist, MD;  Location: [facility];  Service: Gastroenterology;;    SUBMUCOSAL LIFTING INJECTION  08/27/2022   Procedure: SUBMUCOSAL LIFTING INJECTION;  Surgeon: Sherrilyn Rist, MD;  Location: [facility];  Service: Gastroenterology;;   WISDOM TOOTH EXTRACTION      Family History  Problem Relation Age of Onset   Colon polyps Mother    Factor V Leiden deficiency Mother    Fibromyalgia Mother    Diabetes Father    Hyperlipidemia Father    Heart attack Father        threw a blood clot when kidney's shut down   Breast cancer Maternal Aunt    Non-Hodgkin's lymphoma Paternal Aunt    Ovarian cancer Paternal Aunt    Stomach cancer Paternal Aunt    Colon cancer Neg Hx    Esophageal cancer Neg Hx    Rectal cancer Neg Hx    Crohn's disease Neg Hx    Ulcerative colitis Neg Hx     Social History   Tobacco Use   Smoking status: Former    Packs/day: .25    Types: Cigarettes    Start date: 05/21/1993   Smokeless tobacco: Never  Vaping Use   Vaping Use: Some days   Substances: THC  Substance Use Topics   Alcohol use: Yes    Comment: rare   Drug use: Yes    Types: Marijuana    Comment: smoked maraijuana last night 08/12/22  No Known Allergies  Review of Systems NEGATIVE UNLESS OTHERWISE INDICATED IN HPI      Objective:     BP 110/78 (BP Location: Left Arm)   Pulse 76   Temp 97.8 F (36.6 C) (Temporal)   Ht 6\' 2"  (1.88 m)   Wt 224 lb 6.4 oz (101.8 kg)   SpO2 97%   BMI 28.81 kg/m   Wt Readings from Last 3 Encounters:  12/27/22 224 lb 6.4 oz (101.8 kg)  12/03/22 229 lb (103.9 kg)  11/22/22 229 lb (103.9 kg)    BP Readings from Last 3 Encounters:  12/27/22 110/78  11/22/22 121/81  11/18/22 130/86     Physical Exam Vitals and nursing note reviewed.  Constitutional:      Appearance: Normal appearance.  Eyes:     Extraocular Movements: Extraocular movements intact.     Conjunctiva/sclera: Conjunctivae normal.     Pupils: Pupils are equal, round, and reactive to light.  Cardiovascular:     Rate and Rhythm: Normal rate  and regular rhythm.     Pulses: Normal pulses.  Pulmonary:     Effort: Pulmonary effort is normal.     Breath sounds: No wheezing (scattered, faint, diffuse) or rhonchi.  Neurological:     Mental Status: He is alert.  Psychiatric:        Mood and Affect: Mood normal.        Assessment & Plan:  Type 2 diabetes mellitus with diabetic neuropathy, without long-term current use of insulin [facility]) Assessment & Plan: Lab Results  Component Value Date   HGBA1C 7.0 (A) 11/18/2022   HGBA1C 6.0 (A) 05/31/2022   HGBA1C 6.0 (A) 05/03/2022   Metformin XR 500 mg one tab BID Careful with food choices   Orders: -     CBC with Differential/Platelet -     Comprehensive metabolic panel  Neuropathy -     Magnesium -     CBC with Differential/Platelet -     Comprehensive metabolic panel -     Gabapentin; Take 1 capsule (300 mg total) by mouth 2 (two) times daily. Take for nerve pain.  Dispense: 180 capsule; Refill: 1  Bilateral hip pain -     Magnesium -     Gabapentin; Take 1 capsule (300 mg total) by mouth 2 (two) times daily. Take for nerve pain.  Dispense: 180 capsule; Refill: 1  Factor V Leiden [facility]) Assessment & Plan: Patient feels more comfortable staying on lifelong anticoagulation with Eliquis 5 mg BID.  I'm agreeable with this plan. He'll call for refills as needed. Understands to call if any bleeding concerns.    Former smoker -     Albuterol Sulfate HFA; Inhale 2 puffs into the lungs every 6 (six) hours as needed for wheezing or shortness of breath.  Dispense: 1 each; Refill: 2        Return in about 4 months (around 04/28/2023) for recheck/follow-up, POC A1c .     Akeylah Hendel M Arloa Prak, PA-C

## 2022-12-31 ENCOUNTER — Other Ambulatory Visit: Payer: Self-pay

## 2022-12-31 NOTE — Assessment & Plan Note (Signed)
Patient feels more comfortable staying on lifelong anticoagulation with Eliquis 5 mg BID.  I'm agreeable with this plan. He'll call for refills as needed. Understands to call if any bleeding concerns.

## 2022-12-31 NOTE — Assessment & Plan Note (Signed)
Lab Results  Component Value Date   HGBA1C 7.0 (A) 11/18/2022   HGBA1C 6.0 (A) 05/31/2022   HGBA1C 6.0 (A) 05/03/2022   Metformin XR 500 mg one tab BID Careful with food choices

## 2023-01-03 ENCOUNTER — Other Ambulatory Visit: Payer: Managed Care, Other (non HMO)

## 2023-01-04 LAB — BILIRUBIN, FRACTIONATED(TOT/DIR/INDIR)
Bilirubin, Direct: 0.2 mg/dL (ref 0.0–0.2)
Indirect Bilirubin: 1.1 mg/dL (calc) (ref 0.2–1.2)
Total Bilirubin: 1.3 mg/dL — ABNORMAL HIGH (ref 0.2–1.2)

## 2023-01-29 ENCOUNTER — Other Ambulatory Visit: Payer: Self-pay | Admitting: Physician Assistant

## 2023-01-31 ENCOUNTER — Telehealth: Payer: Self-pay | Admitting: Physician Assistant

## 2023-01-31 NOTE — Telephone Encounter (Signed)
Patient called in stating he went to pharmacy to pick up his remeron, albuterol, gabapentin, eliquis, and metformin but was informed it would be $75. Pt states pharmacy informed him it was possibly due to change in insurance. Instead of patient calling the insurance he just stated he would stop taking the medication despite what that would do for his health.

## 2023-02-03 NOTE — Telephone Encounter (Signed)
Please see pt call msg as FYI

## 2023-02-03 NOTE — Telephone Encounter (Signed)
Sent information via Mychart message to patient

## 2023-02-03 NOTE — Telephone Encounter (Signed)
Pt states without alternative he will not be able to take for a week without an alternative. Pt will contact insurance company to see why the increase. Pt also wanted to advise he started taking an Asprin a day again as well since unable to get blood thinner this week. Please advise

## 2023-03-13 ENCOUNTER — Encounter (INDEPENDENT_AMBULATORY_CARE_PROVIDER_SITE_OTHER): Payer: Self-pay

## 2023-04-24 ENCOUNTER — Encounter: Payer: Self-pay | Admitting: Physician Assistant

## 2023-04-24 ENCOUNTER — Ambulatory Visit: Payer: Managed Care, Other (non HMO) | Admitting: Physician Assistant

## 2023-04-24 VITALS — BP 130/86 | HR 84 | Temp 98.2°F | Ht 74.0 in | Wt 240.2 lb

## 2023-04-24 DIAGNOSIS — Z7984 Long term (current) use of oral hypoglycemic drugs: Secondary | ICD-10-CM

## 2023-04-24 DIAGNOSIS — Z87891 Personal history of nicotine dependence: Secondary | ICD-10-CM | POA: Diagnosis not present

## 2023-04-24 DIAGNOSIS — M25551 Pain in right hip: Secondary | ICD-10-CM

## 2023-04-24 DIAGNOSIS — M25552 Pain in left hip: Secondary | ICD-10-CM

## 2023-04-24 DIAGNOSIS — Z23 Encounter for immunization: Secondary | ICD-10-CM | POA: Diagnosis not present

## 2023-04-24 DIAGNOSIS — G629 Polyneuropathy, unspecified: Secondary | ICD-10-CM | POA: Diagnosis not present

## 2023-04-24 DIAGNOSIS — G479 Sleep disorder, unspecified: Secondary | ICD-10-CM

## 2023-04-24 DIAGNOSIS — E1140 Type 2 diabetes mellitus with diabetic neuropathy, unspecified: Secondary | ICD-10-CM | POA: Diagnosis not present

## 2023-04-24 LAB — MICROALBUMIN / CREATININE URINE RATIO
Creatinine,U: 141.7 mg/dL
Microalb Creat Ratio: 0.5 mg/g (ref 0.0–30.0)
Microalb, Ur: <0.7 mg/dL (ref 0.0–1.9)

## 2023-04-24 LAB — POCT GLYCOSYLATED HEMOGLOBIN (HGB A1C): Hemoglobin A1C: 7.4 % — AB (ref 4.0–5.6)

## 2023-04-24 MED ORDER — MIRTAZAPINE 15 MG PO TABS
15.0000 mg | ORAL_TABLET | Freq: Every day | ORAL | 0 refills | Status: DC
Start: 2023-04-24 — End: 2023-09-04

## 2023-04-24 MED ORDER — METFORMIN HCL ER 500 MG PO TB24
500.0000 mg | ORAL_TABLET | Freq: Two times a day (BID) | ORAL | 2 refills | Status: DC
Start: 2023-04-24 — End: 2023-07-22

## 2023-04-24 MED ORDER — ALBUTEROL SULFATE HFA 108 (90 BASE) MCG/ACT IN AERS
2.0000 | INHALATION_SPRAY | Freq: Four times a day (QID) | RESPIRATORY_TRACT | 2 refills | Status: DC | PRN
Start: 2023-04-24 — End: 2024-04-14

## 2023-04-24 MED ORDER — GABAPENTIN 300 MG PO CAPS
300.0000 mg | ORAL_CAPSULE | Freq: Two times a day (BID) | ORAL | 1 refills | Status: DC
Start: 2023-04-24 — End: 2024-03-16

## 2023-04-24 NOTE — Assessment & Plan Note (Signed)
Stable with Remeron 15 mg at bedtime. Will continue at this dose.  Talked about appetite increase - may need to change off this if steadily rising in weight continues.

## 2023-04-24 NOTE — Progress Notes (Signed)
Subjective:    Patient ID: Bryan Hull, male    DOB: 1975-08-08, 47 y.o.   MRN: [id number]  Chief Complaint  Patient presents with   Diabetes    Pt in office for A1C check and f/u on diabetes; pt states feet is still numb and pain in hip but learning to deal with it; pt states gaining weight. Was off meds for about 30 days but started back when he could get financially prepared.     Diabetes   Patient is in today for recheck. No changes in the interim. States he had to be off medication about 1 month due to finances, but back taking everything again. Spent 2 weeks at [employer], states he's been feeling well and like himself again.   Past Medical History:  Diagnosis Date   Asthma    as a child/teenager   Blood transfusion without reported diagnosis    SEPTEMBER   Clotting disorder (HCC)    FACTOR V   Diabetes mellitus without complication (HCC)    Factor V Leiden (HCC)    Family history of factor V Leiden mutation    Neuromuscular disorder (HCC)    NEUROPATHY   Peripheral vascular disease (HCC)    PAD    Past Surgical History:  Procedure Laterality Date   AORTA - BILATERAL FEMORAL ARTERY BYPASS GRAFT N/A 04/22/2022   Procedure: AORTOBIFEMORAL BYPASS GRAFT;  Surgeon: Victorino Sparrow, MD;  Location: [facility] OR;  Service: Vascular;  Laterality: N/A;   APPENDECTOMY     COLONOSCOPY     COLONOSCOPY WITH PROPOFOL N/A 08/27/2022   Procedure: COLONOSCOPY WITH PROPOFOL;  Surgeon: Sherrilyn Rist, MD;  Location: [facility];  Service: Gastroenterology;  Laterality: N/A;   ENDOSCOPIC MUCOSAL RESECTION  08/27/2022   Procedure: ENDOSCOPIC MUCOSAL RESECTION;  Surgeon: Sherrilyn Rist, MD;  Location: [facility];  Service: Gastroenterology;;   HEMOSTASIS CLIP PLACEMENT  08/27/2022   Procedure: HEMOSTASIS CLIP PLACEMENT;  Surgeon: Sherrilyn Rist, MD;  Location: [facility];  Service: Gastroenterology;;   POLYPECTOMY  08/27/2022   Procedure: POLYPECTOMY;  Surgeon: Sherrilyn Rist, MD;  Location: [facility];  Service: Gastroenterology;;   SUBMUCOSAL LIFTING INJECTION  08/27/2022   Procedure: SUBMUCOSAL LIFTING INJECTION;  Surgeon: Sherrilyn Rist, MD;  Location: [facility];  Service: Gastroenterology;;   WISDOM TOOTH EXTRACTION      Family History  Problem Relation Age of Onset   Colon polyps Mother    Factor V Leiden deficiency Mother    Fibromyalgia Mother    Diabetes Father    Hyperlipidemia Father    Heart attack Father        threw a blood clot when kidney's shut down   Breast cancer Maternal Aunt    Non-Hodgkin's lymphoma Paternal Aunt    Ovarian cancer Paternal Aunt    Stomach cancer Paternal Aunt    Colon cancer Neg Hx    Esophageal cancer Neg Hx    Rectal cancer Neg Hx    Crohn's disease Neg Hx    Ulcerative colitis Neg Hx     Social History   Tobacco Use   Smoking status: Former    Current packs/day: 0.25    Average packs/day: 0.3 packs/day for 29.9 years (7.5 ttl pk-yrs)    Types: Cigarettes    Start date: 05/21/1993   Smokeless tobacco: Never  Vaping Use   Vaping status: Some Days   Substances: THC  Substance Use Topics  Alcohol use: Yes    Comment: rare   Drug use: Yes    Types: Marijuana    Comment: smoked maraijuana last night 08/12/22     No Known Allergies  Review of Systems NEGATIVE UNLESS OTHERWISE INDICATED IN HPI      Objective:     BP 130/86 (BP Location: Left Arm)   Pulse 84   Temp 98.2 F (36.8 C) (Temporal)   Ht 6\' 2"  (1.88 m)   Wt 240 lb 3.2 oz (109 kg)   SpO2 96%   BMI 30.84 kg/m   Wt Readings from Last 3 Encounters:  04/24/23 240 lb 3.2 oz (109 kg)  12/27/22 224 lb 6.4 oz (101.8 kg)  12/03/22 229 lb (103.9 kg)    BP Readings from Last 3 Encounters:  04/24/23 130/86  12/27/22 110/78  11/22/22 121/81     Physical Exam Vitals and nursing note reviewed.  Constitutional:      Appearance: Normal appearance.  Eyes:     Extraocular Movements: Extraocular movements intact.      Conjunctiva/sclera: Conjunctivae normal.     Pupils: Pupils are equal, round, and reactive to light.  Cardiovascular:     Rate and Rhythm: Normal rate and regular rhythm.     Pulses: Normal pulses.  Pulmonary:     Effort: Pulmonary effort is normal.     Breath sounds: No wheezing or rhonchi.  Neurological:     Mental Status: He is alert.  Psychiatric:        Mood and Affect: Mood normal.        Assessment & Plan:  Type 2 diabetes mellitus with diabetic neuropathy, without long-term current use of insulin (HCC) Assessment & Plan: Lab Results  Component Value Date   HGBA1C 7.4 (A) 04/24/2023   HGBA1C 7.0 (A) 11/18/2022   HGBA1C 6.0 (A) 05/31/2022   Metformin XR 500 mg BID refilled Work on Therapist, music, good job walking more Microalbumin today  Orders: -     POCT glycosylated hemoglobin (Hb A1C) -     metFORMIN HCl ER; Take 1 tablet (500 mg total) by mouth 2 (two) times daily with a meal.  Dispense: 60 tablet; Refill: 2 -     Microalbumin / creatinine urine ratio  Former smoker -     Albuterol Sulfate HFA; Inhale 2 puffs into the lungs every 6 (six) hours as needed for wheezing or shortness of breath.  Dispense: 1 each; Refill: 2  Neuropathy Assessment & Plan: Gabapentin 300 mg qhs is helping, refilled   Orders: -     Gabapentin; Take 1 capsule (300 mg total) by mouth 2 (two) times daily. Take for nerve pain.  Dispense: 180 capsule; Refill: 1  Bilateral hip pain -     Gabapentin; Take 1 capsule (300 mg total) by mouth 2 (two) times daily. Take for nerve pain.  Dispense: 180 capsule; Refill: 1  Immunization due -     Flu vaccine trivalent PF, 6mos and older(Flulaval,Afluria,Fluarix,Fluzone)  Difficulty sleeping Assessment & Plan: Stable with Remeron 15 mg at bedtime. Will continue at this dose.  Talked about appetite increase - may need to change off this if steadily rising in weight continues.   Orders: -     Mirtazapine; Take 1 tablet (15 mg total) by mouth at  bedtime.  Dispense: 90 tablet; Refill: 0        Return in about 4 months (around 08/24/2023) for recheck/follow-up, fasting labs .     Akshat Minehart M Zitlaly Malson, PA-C

## 2023-04-24 NOTE — Assessment & Plan Note (Signed)
Lab Results  Component Value Date   HGBA1C 7.4 (A) 04/24/2023   HGBA1C 7.0 (A) 11/18/2022   HGBA1C 6.0 (A) 05/31/2022   Metformin XR 500 mg BID refilled Work on food choices, good job walking more Microalbumin today

## 2023-04-24 NOTE — Assessment & Plan Note (Signed)
Gabapentin 300 mg qhs is helping, refilled

## 2023-05-08 ENCOUNTER — Encounter: Payer: Self-pay | Admitting: Physician Assistant

## 2023-07-21 ENCOUNTER — Other Ambulatory Visit: Payer: Self-pay | Admitting: Physician Assistant

## 2023-07-21 DIAGNOSIS — E1140 Type 2 diabetes mellitus with diabetic neuropathy, unspecified: Secondary | ICD-10-CM

## 2023-08-29 ENCOUNTER — Ambulatory Visit: Payer: Managed Care, Other (non HMO) | Admitting: Physician Assistant

## 2023-09-04 ENCOUNTER — Ambulatory Visit: Payer: Managed Care, Other (non HMO) | Admitting: Physician Assistant

## 2023-09-04 ENCOUNTER — Encounter: Payer: Self-pay | Admitting: Physician Assistant

## 2023-09-04 VITALS — BP 108/72 | HR 81 | Temp 97.7°F | Ht 74.0 in | Wt 246.2 lb

## 2023-09-04 DIAGNOSIS — Z23 Encounter for immunization: Secondary | ICD-10-CM

## 2023-09-04 DIAGNOSIS — E1140 Type 2 diabetes mellitus with diabetic neuropathy, unspecified: Secondary | ICD-10-CM | POA: Diagnosis not present

## 2023-09-04 DIAGNOSIS — G629 Polyneuropathy, unspecified: Secondary | ICD-10-CM

## 2023-09-04 DIAGNOSIS — G479 Sleep disorder, unspecified: Secondary | ICD-10-CM

## 2023-09-04 DIAGNOSIS — K0889 Other specified disorders of teeth and supporting structures: Secondary | ICD-10-CM

## 2023-09-04 LAB — POCT GLYCOSYLATED HEMOGLOBIN (HGB A1C): Hemoglobin A1C: 7.2 % — AB (ref 4.0–5.6)

## 2023-09-04 MED ORDER — AMOXICILLIN-POT CLAVULANATE 875-125 MG PO TABS: 1.0000 | ORAL_TABLET | Freq: Two times a day (BID) | ORAL | 0 refills | Status: DC

## 2023-09-04 MED ORDER — TRAZODONE HCL 50 MG PO TABS: 50.0000 mg | ORAL_TABLET | Freq: Every day | ORAL | 2 refills | Status: DC

## 2023-09-04 NOTE — Progress Notes (Signed)
 Patient ID: Bryan Hull, male    DOB: [birth year], 48 y.o.   MRN: [id number]   Assessment & Plan:  Type 2 diabetes mellitus with diabetic neuropathy, without long-term current use of insulin  (HCC) -     POCT glycosylated hemoglobin (Hb A1C)  Difficulty sleeping -     traZODone  HCl; Take 1 tablet (50 mg total) by mouth at bedtime.  Dispense: 30 tablet; Refill: 2  Neuropathy  Need for pneumococcal vaccine -     Pneumococcal conjugate vaccine 20-valent  Pain, dental -     Amoxicillin -Pot Clavulanate; Take 1 tablet by mouth 2 (two) times daily. Take with food.  Dispense: 20 tablet; Refill: 0   Assessment and Plan    Type 2 Diabetes Mellitus A1c of 7.2, indicating fair glycemic control. Patient admits to occasional non-adherence to medication regimen due to work schedule. No reported hypoglycemic episodes or significant illnesses over the winter. -Encourage consistent medication adherence. -Plan to check fasting labs at next visit in 4-6 months.  Peripheral Neuropathy Patient reports persistent numbness in feet, with sensation beginning at the level of the calves. No reported pain, but occasional stinging sensations. Patient is currently on gabapentin  for symptom management. -Continue gabapentin  as prescribed. -Encourage patient to monitor for any changes in sensation or appearance of feet.  Sleep Disturbance Patient reports good sleep with current regimen but expresses concern about potential weight gain associated with current sleep medication (Remeron ). -Discontinue Remeron . -Start Trazodone  50mg  at bedtime, with instructions to adjust dose as needed for sleep quality up to 100 -150 mg.  -Plan to reassess in a few weeks.   Dental Pain Patient reports dental pain and has a dental appointment scheduled in the near future. -Prescribe Augmentin  to be taken with food for dental pain until dental appointment.  Hyperlipidemia Patient is not currently on a statin due to previous  side effects (hip pain). Patient reports improvement in hip pain since discontinuing statin. -Plan to check lipid panel at next visit in 4-6 months. -Discuss potential need for alternative cholesterol-lowering therapy (e.g., Repatha) depending on results of lipid panel.     Return in about 4 months (around 01/02/2024) for recheck/follow-up, fasting labs .    Subjective:    Chief Complaint  Patient presents with   Diabetes    Pt in office for 3 mon diabetes f/u; pt states he has a tooth bothering him can't see dentist until end of this month. Pt also still having numbness and tingling in both feet, no skin breakdown and sores; pt states toe nails on both feet are purple but thinks this is due to shoes he's wearing.     Diabetes   Discussed the use of AI scribe software for clinical note transcription with the patient, who gave verbal consent to proceed.  History of Present Illness   The patient, with a history of diabetes, presents with an A1c of 7.2. 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 Despite this, he reports feeling generally well and did not experience any significant illnesses over the winter. He also mentions occasional foot discoloration and numbness, which he attributes to his diabetes.  The patient also discusses his sleep patterns and the use of sleep medication. He expresses satisfaction with his current sleep medication, Remeron , as it allows him to get six to seven hours of uninterrupted sleep most nights. However, he expresses a willingness to switch to a different medication if it  can help manage his weight without compromising his sleep quality.  The patient also mentions dental pain and requests antibiotics until he can see a dentist. He reports that he has an appointment scheduled to address his dental issues, as all his teeth are in poor condition.       Past Medical History:  Diagnosis  Date   Asthma    as a child/teenager   Blood transfusion without reported diagnosis    SEPTEMBER   Clotting disorder (HCC)    FACTOR V   Diabetes mellitus without complication (HCC)    Factor V Leiden (HCC)    Family history of factor V Leiden mutation    Neuromuscular disorder (HCC)    NEUROPATHY   Peripheral vascular disease (HCC)    PAD    Past Surgical History:  Procedure Laterality Date   AORTA - BILATERAL FEMORAL ARTERY BYPASS GRAFT N/A 04/22/2022   Procedure: AORTOBIFEMORAL BYPASS GRAFT;  Surgeon: Lanis Fonda BRAVO, MD;  Location: [facility] OR;  Service: Vascular;  Laterality: N/A;   APPENDECTOMY     COLONOSCOPY     COLONOSCOPY WITH PROPOFOL  N/A 08/27/2022   Procedure: COLONOSCOPY WITH PROPOFOL ;  Surgeon: Legrand Victory LITTIE DOUGLAS, MD;  Location: [facility];  Service: Gastroenterology;  Laterality: N/A;   ENDOSCOPIC MUCOSAL RESECTION  08/27/2022   Procedure: ENDOSCOPIC MUCOSAL RESECTION;  Surgeon: Legrand Victory LITTIE DOUGLAS, MD;  Location: [facility];  Service: Gastroenterology;;   HEMOSTASIS CLIP PLACEMENT  08/27/2022   Procedure: HEMOSTASIS CLIP PLACEMENT;  Surgeon: Legrand Victory LITTIE DOUGLAS, MD;  Location: [facility];  Service: Gastroenterology;;   POLYPECTOMY  08/27/2022   Procedure: POLYPECTOMY;  Surgeon: Legrand Victory LITTIE DOUGLAS, MD;  Location: [facility];  Service: Gastroenterology;;   SUBMUCOSAL LIFTING INJECTION  08/27/2022   Procedure: SUBMUCOSAL LIFTING INJECTION;  Surgeon: Legrand Victory LITTIE DOUGLAS, MD;  Location: [facility];  Service: Gastroenterology;;   WISDOM TOOTH EXTRACTION      Family History  Problem Relation Age of Onset   Colon polyps Mother    Factor V Leiden deficiency Mother    Fibromyalgia Mother    Diabetes Father    Hyperlipidemia Father    Heart attack Father        threw a blood clot when kidney's shut down   Breast cancer Maternal Aunt    Non-Hodgkin's lymphoma Paternal Aunt    Ovarian cancer Paternal Aunt    Stomach cancer Paternal Aunt    Colon cancer Neg Hx     Esophageal cancer Neg Hx    Rectal cancer Neg Hx    Crohn's disease Neg Hx    Ulcerative colitis Neg Hx     Social History   Tobacco Use   Smoking status: Former    Current packs/day: 0.25    Average packs/day: 0.3 packs/day for 30.3 years (7.6 ttl pk-yrs)    Types: Cigarettes    Start date: 05/21/1993   Smokeless tobacco: Never  Vaping Use   Vaping status: Some Days   Substances: THC  Substance Use Topics   Alcohol use: Yes    Comment: rare   Drug use: Yes    Types: Marijuana    Comment: smoked maraijuana last night 08/12/22     No Known Allergies  Review of Systems NEGATIVE UNLESS OTHERWISE INDICATED IN HPI      Objective:     BP 108/72 (BP Location: Left Arm, Patient Position: Sitting, Cuff Size: Normal)   Pulse 81   Temp 97.7 F (36.5 C) (Temporal)  Ht 6' 2 (1.88 m)   Wt 246 lb 3.2 oz (111.7 kg)   SpO2 97%   BMI 31.61 kg/m   Wt Readings from Last 3 Encounters:  09/04/23 246 lb 3.2 oz (111.7 kg)  04/24/23 240 lb 3.2 oz (109 kg)  12/27/22 224 lb 6.4 oz (101.8 kg)    BP Readings from Last 3 Encounters:  09/04/23 108/72  04/24/23 130/86  12/27/22 110/78     Physical Exam Vitals and nursing note reviewed.  Constitutional:      Appearance: Normal appearance.  Eyes:     Extraocular Movements: Extraocular movements intact.     Conjunctiva/sclera: Conjunctivae normal.     Pupils: Pupils are equal, round, and reactive to light.  Cardiovascular:     Rate and Rhythm: Normal rate and regular rhythm.     Pulses: Normal pulses.  Pulmonary:     Effort: Pulmonary effort is normal.     Breath sounds: No wheezing or rhonchi.  Musculoskeletal:     Right lower leg: No edema.     Left lower leg: No edema.  Neurological:     Mental Status: He is alert.     Sensory: Sensory deficit (monofilament sensation absent bilateral feet and ankles until about mid-calf) present.  Psychiatric:        Mood and Affect: Mood normal.        Avalon Coppinger M Jordon Bourquin, PA-C

## 2023-09-10 ENCOUNTER — Encounter: Payer: Self-pay | Admitting: Gastroenterology

## 2023-10-08 ENCOUNTER — Emergency Department: Admit: 2023-10-08 | Payer: 59

## 2023-10-08 ENCOUNTER — Inpatient Hospital Stay
Admit: 2023-10-08 | Discharge: 2023-10-09 | Disposition: A | Discharge status: Home / Self Care | Payer: PRIVATE HEALTH INSURANCE | Attending: Emergency Medicine

## 2023-10-08 ENCOUNTER — Other Ambulatory Visit: Payer: Self-pay

## 2023-10-08 ENCOUNTER — Other Ambulatory Visit: Payer: Self-pay | Admitting: Physician Assistant

## 2023-10-08 DIAGNOSIS — G44209 Tension-type headache, unspecified, not intractable: Secondary | ICD-10-CM

## 2023-10-08 DIAGNOSIS — G479 Sleep disorder, unspecified: Secondary | ICD-10-CM

## 2023-10-08 LAB — COMPREHENSIVE METABOLIC PANEL
ALT: 42 U/L (ref 12–78)
AST: 34 U/L (ref 15–37)
Albumin/Globulin Ratio: 1.1 (ref 1.1–2.2)
Albumin: 3.8 g/dL (ref 3.5–5.0)
Alk Phosphatase: 94 U/L (ref 45–117)
Anion Gap: 11 mmol/L (ref 2–12)
BUN/Creatinine Ratio: 11 — ABNORMAL LOW (ref 12–20)
BUN: 13 mg/dL (ref 6–20)
CO2: 27 mmol/L (ref 21–32)
Calcium: 8.5 mg/dL (ref 8.5–10.1)
Chloride: 101 mmol/L (ref 97–108)
Creatinine: 1.17 mg/dL (ref 0.70–1.30)
Est, Glom Filt Rate: 77 mL/min/{1.73_m2} (ref >60)
Globulin: 3.6 g/dL (ref 2.0–4.0)
Glucose: 102 mg/dL — ABNORMAL HIGH (ref 65–100)
Potassium: 3.8 mmol/L (ref 3.5–5.1)
Sodium: 139 mmol/L (ref 136–145)
Total Bilirubin: 0.5 mg/dL (ref 0.2–1.0)
Total Protein: 7.4 g/dL (ref 6.4–8.2)

## 2023-10-08 LAB — CBC WITH AUTO DIFFERENTIAL
Basophils %: 0.5 % (ref 0.0–1.0)
Basophils Absolute: 0.03 10*3/uL (ref 0.00–0.10)
Eosinophils %: 1.1 % (ref 0.0–7.0)
Eosinophils Absolute: 0.07 10*3/uL (ref 0.00–0.40)
Hematocrit: 43.9 % (ref 36.6–50.3)
Hemoglobin: 15 g/dL (ref 12.1–17.0)
Immature Granulocytes %: 0.6 % — ABNORMAL HIGH (ref 0–0.5)
Immature Granulocytes Absolute: 0.04 10*3/uL (ref 0.00–0.04)
Lymphocytes %: 14.5 % (ref 12.0–49.0)
Lymphocytes Absolute: 0.91 10*3/uL (ref 0.80–3.50)
MCH: 29.1 pg (ref 26.0–34.0)
MCHC: 34.2 g/dL (ref 30.0–36.5)
MCV: 85.2 FL (ref 80.0–99.0)
MPV: 9.8 FL (ref 8.9–12.9)
Monocytes %: 13.6 % — ABNORMAL HIGH (ref 5.0–13.0)
Monocytes Absolute: 0.85 10*3/uL (ref 0.00–1.00)
Neutrophils %: 69.7 % (ref 32.0–75.0)
Neutrophils Absolute: 4.37 10*3/uL (ref 1.80–8.00)
Nucleated RBCs: 0 /100{WBCs}
Platelets: 261 10*3/uL (ref 150–400)
RBC: 5.15 M/uL (ref 4.10–5.70)
RDW: 13.3 % (ref 11.5–14.5)
WBC: 6.3 10*3/uL (ref 4.1–11.1)
nRBC: 0 10*3/uL (ref 0.00–0.01)

## 2023-10-08 LAB — TROPONIN: Troponin, High Sensitivity: 4 ng/L (ref 0–76)

## 2023-10-08 MED ORDER — IOPAMIDOL 76 % IV SOLN
76 | Freq: Once | INTRAVENOUS | Status: AC | PRN
Start: 2023-10-08 — End: 2023-10-08
  Administered 2023-10-08: 100 mL via INTRAVENOUS

## 2023-10-08 MED FILL — ISOVUE-370 76 % IV SOLN: 76 % | INTRAVENOUS | Qty: 100 | Fill #0

## 2023-10-08 NOTE — ED Triage Notes (Signed)
 Sent from [facility] for HA, blurred vision and stiff neck intermittently x1 week but worsened last night; sent d/t family hx aneurysm. Last dose Tylenol 1645. Reports pain is at base of skull primarily on right side that wraps around skull and radiates up.

## 2023-10-08 NOTE — ED Provider Notes (Signed)
 [facility] EMERGENCY DEPT  EMERGENCY DEPARTMENT HISTORY AND PHYSICAL EXAM      Date: 10/08/2023  Patient Name: Corey Shah  MRN: [id number]  Birthdate 01-28-1976  Date of evaluation: 10/08/2023  Provider: Dr. Unknown Jim, MD  Note Started: 5:47 AM EDT 10/09/23    HISTORY OF PRESENT ILLNESS     Chief Complaint   Patient presents with    Headache       History Provided By: Patient    HPI: Corey Shah is a 48 y.o. male.  Patient presents with a complaint of occipital headache and stiff neck for 1 week.  Patient states that he woke up with the symptoms about a week ago without history of injury.  Pain also aggravates when he lays down at night.  No paresthesia or motor deficit.  No slurred speech, visual changes, altered mental status, or vertigo.  No history of head injury.  Patient was seen at an urgent care and was sent to the emergency room for further evaluation.  Stroke alert was called prior to my beginning of the night shift at 7 PM.  Labs and CTA and head CT were completed upon my arrival to emergency room at 7 PM.  No teleneuro notified        PAST MEDICAL HISTORY   Past Medical History:  Past Medical History:   Diagnosis Date    High triglycerides     Prediabetes        Past Surgical History:  Past Surgical History:   Procedure Laterality Date    SPINAL FUSION      04/2023       Family History:  History reviewed. No pertinent family history.    Social History:  Social History     Tobacco Use    Smoking status: Never    Smokeless tobacco: Never   Vaping Use    Vaping status: Never Used   Substance Use Topics    Alcohol use: Never    Drug use: Never       Allergies:  No Known Allergies    PCP: No primary care provider on file.    Current Meds:   No current facility-administered medications for this encounter.     Current Outpatient Medications   Medication Sig Dispense Refill    acetaminophen (TYLENOL) 500 MG tablet Take 2 tablets by mouth every 8 hours as needed for Fever or Pain 360 tablet 1    ibuprofen (ADVIL) 200  MG tablet Take 2 tablets by mouth every 8 hours as needed for Pain 20 tablet 0    lidocaine 4 % external patch Place 1 patch onto the skin daily 30 patch 0    fenofibrate (TRICOR) 48 MG tablet Take 1 tablet by mouth daily      ergocalciferol (ERGOCALCIFEROL) 1.25 MG (50000 UT) capsule Take 1 capsule by mouth once a week      ferrous sulfate (FE TABS 325) 325 (65 Fe) MG EC tablet Take 1 tablet by mouth every other day      naproxen (NAPROSYN) 500 MG tablet Take 1 tablet by mouth 2 times daily (with meals) (Patient not taking: Reported on 10/08/2023) 60 tablet 0       Social Determinants of Health:   Social Drivers of Health     Tobacco Use: Low Risk  (10/08/2023)    Patient History     Smoking Tobacco Use: Never     Smokeless Tobacco Use: Never     Passive Exposure: Not  on file   Alcohol Use: Not At Risk (10/08/2023)    AUDIT-C     Frequency of Alcohol Consumption: Never     Average Number of Drinks: Patient does not drink     Frequency of Binge Drinking: Never   Financial Resource Strain: Not on file   Food Insecurity: Not on file   Transportation Needs: Not on file   Physical Activity: Not on file   Stress: Not on file   Social Connections: Not on file   Intimate Partner Violence: Not on file   Depression: Not at risk (09/09/2022)    [facility]    PHQ-2     PHQ Total Score: 0   Housing Stability: Not on file   Interpersonal Safety: Not At Risk (12/01/2022)    Interpersonal Safety Domain Source: IP Abuse Screening     Physical abuse: Denies     Verbal abuse: Denies     Emotional abuse: Denies     Financial abuse: Denies     Sexual abuse: Denies   Utilities: Not on file       PHYSICAL EXAM   Physical Exam  Vitals and nursing note reviewed.   Constitutional:       General: He is not in acute distress.     Appearance: Normal appearance. He is not ill-appearing, toxic-appearing or diaphoretic.   HENT:      Head: Normocephalic and atraumatic.      Nose:  Nose normal.      Mouth/Throat:      Pharynx: No oropharyngeal exudate or posterior oropharyngeal erythema.   Eyes:      General: No scleral icterus.     Extraocular Movements: Extraocular movements intact.      Conjunctiva/sclera: Conjunctivae normal.      Pupils: Pupils are equal, round, and reactive to light.      Comments: No photophobia.   Neck:      Comments: Mild paravertebral spasm on movement of the neck  Cardiovascular:      Rate and Rhythm: Normal rate and regular rhythm.      Heart sounds: Normal heart sounds.   Pulmonary:      Effort: Pulmonary effort is normal.      Breath sounds: Normal breath sounds.   Abdominal:      Palpations: Abdomen is soft.      Tenderness: There is no abdominal tenderness. There is no guarding.   Musculoskeletal:      Cervical back: Neck supple. No tenderness.      Right lower leg: No edema.      Left lower leg: No edema.   Skin:     General: Skin is warm and dry.   Neurological:      General: No focal deficit present.      Mental Status: He is alert and oriented to person, place, and time.      Cranial Nerves: No cranial nerve deficit.      Motor: No weakness.      Gait: Gait normal.   Psychiatric:         Behavior: Behavior normal.           SCREENINGS     NIH Stroke Scale  NIH Stroke Scale Assessed: Yes  Interval: Baseline  Level of Consciousness (1a): Alert  LOC Questions (1b): Answers both correctly  LOC Commands (1c): Performs both tasks correctly  Best Gaze (2): Normal  Visual (3): No  visual loss  Facial Palsy (4): Normal symmetrical movement  Motor Arm, Left (5a): No drift  Motor Arm, Right (5b): No drift  Motor Leg, Left (6a): No drift  Motor Leg, Right (6b): No drift  Limb Ataxia (7): Absent  Sensory (8): Normal  Best Language (9): No aphasia  Dysarthria (10): Normal  Extinction and Inattention (11): No abnormality  Total: 0             LAB, EKG AND DIAGNOSTIC RESULTS   Labs:  Recent Results (from the past 12 hours)   CBC with Auto Differential    Collection Time:  10/08/23  7:13 PM   Result Value Ref Range    WBC 6.3 4.1 - 11.1 K/uL    RBC 5.15 4.10 - 5.70 M/uL    Hemoglobin 15.0 12.1 - 17.0 g/dL    Hematocrit 16.1 09.6 - 50.3 %    MCV 85.2 80.0 - 99.0 FL    MCH 29.1 26.0 - 34.0 PG    MCHC 34.2 30.0 - 36.5 g/dL    RDW 04.5 40.9 - 81.1 %    Platelets 261 150 - 400 K/uL    MPV 9.8 8.9 - 12.9 FL    Nucleated RBCs 0.0 0.0 PER 100 WBC    nRBC 0.00 0.00 - 0.01 K/uL    Neutrophils % 69.7 32.0 - 75.0 %    Lymphocytes % 14.5 12.0 - 49.0 %    Monocytes % 13.6 (H) 5.0 - 13.0 %    Eosinophils % 1.1 0.0 - 7.0 %    Basophils % 0.5 0.0 - 1.0 %    Immature Granulocytes % 0.6 (H) 0 - 0.5 %    Neutrophils Absolute 4.37 1.80 - 8.00 K/UL    Lymphocytes Absolute 0.91 0.80 - 3.50 K/UL    Monocytes Absolute 0.85 0.00 - 1.00 K/UL    Eosinophils Absolute 0.07 0.00 - 0.40 K/UL    Basophils Absolute 0.03 0.00 - 0.10 K/UL    Immature Granulocytes Absolute 0.04 0.00 - 0.04 K/UL    Differential Type AUTOMATED     Comprehensive Metabolic Panel    Collection Time: 10/08/23  7:13 PM   Result Value Ref Range    Sodium 139 136 - 145 mmol/L    Potassium 3.8 3.5 - 5.1 mmol/L    Chloride 101 97 - 108 mmol/L    CO2 27 21 - 32 mmol/L    Anion Gap 11 2 - 12 mmol/L    Glucose 102 (H) 65 - 100 mg/dL    BUN 13 6 - 20 mg/dL    Creatinine 9.14 7.82 - 1.30 mg/dL    BUN/Creatinine Ratio 11 (L) 12 - 20      Est, Glom Filt Rate 77 >60 ml/min/1.34m2    Calcium 8.5 8.5 - 10.1 mg/dL    Total Bilirubin 0.5 0.2 - 1.0 mg/dL    AST 34 15 - 37 U/L    ALT 42 12 - 78 U/L    Alk Phosphatase 94 45 - 117 U/L    Total Protein 7.4 6.4 - 8.2 g/dL    Albumin 3.8 3.5 - 5.0 g/dL    Globulin 3.6 2.0 - 4.0 g/dL    Albumin/Globulin Ratio 1.1 1.1 - 2.2     Troponin    Collection Time: 10/08/23  7:13 PM   Result Value Ref Range    Troponin, High Sensitivity 4 0 - 76 ng/L   EKG 12 Lead    Collection Time: 10/08/23  8:09 PM   Result Value Ref Range    Ventricular Rate 95 BPM    Atrial Rate 96 BPM    P-R Interval 126 ms    QRS Duration 78 ms    Q-T  Interval 332 ms    QTc Calculation (Bazett) 418 ms    P Axis 28 degrees    R Axis -4 degrees    T Axis 24 degrees    Diagnosis       Sinus rhythm  Probable left atrial enlargement  Abnormal R-wave progression, early transition  Probable left ventricular hypertrophy         EKG: interpreted by me -normal sinus rhythm at rate of 95 normal QRS QT nonspecific ST-T wave changes no STEMI    Radiologic Studies:  Non-plain film images such as CT, Ultrasound and MRI are read by the radiologist. Plain radiographic images are visualized and preliminarily interpreted by the ED Provider with the following findings:   Xray interpreted by me pending radiologist's report - no acute changes    Interpretation per the Radiologist below, if available at the time of this note:  CTA HEAD NECK W CONTRAST   Final Result   No large vessel occlusion, intracranial aneurysm, or hemodynamically significant   carotid stenosis.   7 mm nodule at the right lung apex.   Guidelines by the Fleischner society (Radiology 2017 special report) suggest   that patients with a low risk for lung cancer who have solid nodule(s) greater   than 6 mm and less than or equal to 8 mm in diameter should have follow up in   approximately 6 to 12 months for single nodule and 3 to 6 months for multiple   solid nodules and, if no change, at 18-24 months.  In patients with a higher   risk, such as smokers, follow-up is recommended in 6 to 12 months for single   nodule and 3 to 6 months for multiple nodules and, if no change, at 18-24   months.  Patients with a known malignancy are at increased risk for metastasis   and should receive a three month follow-up.            Electronically signed by Towanda Octave      CT HEAD WO CONTRAST   Final Result   No acute intracranial abnormality.            Electronically signed by Renaldo Fiddler           ED COURSE and DIFFERENTIAL DIAGNOSIS/MDM   5:57 AM Differential and Considerations:     Differential diagnosis that were considered  include:     Patient presents with 1 week history of occipital headache and stiff neck without injury.  No symptoms of neurodeficit.  Patient was sent to the emergency from urgent care for further evaluation.  Alert orient x 4.  Neurologically nonfocal.  No respiratory distress.  No meningeal signs.  Exam was unremarkable except for mild paravertebral spasm on movement.  CT of the head and CT of the head and neck was unremarkable except for the incidental finding of a pulmonary nodule.  I discussed with the CT result with the patient.  NIH is 0.  Patient remained alert and neurologically nonfocal throughout the stay emergency room.  The headache and stiff neck is likely due to mild form of torticollis and tension headache.  Lab work was unremarkable.  EKG normal sinus rhythm.  P.o. Tylenol/IV Toradol given with improvement in headache  and neck pain.  Patient is discharged in stable condition with instruction to follow-up with his primary for follow-up that include repeat CT for the workup of pulmonary nodule    Records Reviewed (source and summary of external notes): Prior medical records and Nursing notes.    Vitals:    Vitals:    10/08/23 1902 10/08/23 2000 10/08/23 2030   BP: (!) 148/90 (!) 124/91 136/89   Pulse: (!) 114     Resp: 17     Temp: 97.7 F (36.5 C)     TempSrc: Oral     SpO2: 96% 95% 95%   Weight: 106.1 kg (233 lb 12.8 oz)     Height: 1.727 m (5\' 8" )          ED COURSE     Alert orient x 4.  Neurologically nonfocal.  Ambulate without difficulty.  No respiratory distress        Patient was given the following medications:  Medications   iopamidol (ISOVUE-370) 76 % injection 100 mL (100 mLs IntraVENous Given 10/08/23 1932)   acetaminophen (TYLENOL) tablet 1,000 mg (1,000 mg Oral Given 10/08/23 2038)   ketorolac (TORADOL) injection 15 mg (15 mg IntraVENous Given 10/08/23 2039)       CONSULTS: See ED Course/MDM for further details.            PROCEDURES   Unless otherwise noted above, none  Procedures       CRITICAL CARE TIME       ED IMPRESSION     1. Tension headache    2. Torticollis          DISPOSITION/PLAN   DISPOSITION Decision To Discharge 10/08/2023 08:21:48 PM   DISPOSITION CONDITION Stable         Discharge Note: The patient is stable for discharge home. The signs, symptoms, diagnosis, and discharge instructions have been discussed, understanding conveyed, and agreed upon. The patient is to follow up as recommended or return to ER should their symptoms worsen.      PATIENT REFERRED TO:  Your Primary Care doctor    Call in 1 day          DISCHARGE MEDICATIONS:     Medication List        START taking these medications      acetaminophen 500 MG tablet  Commonly known as: TYLENOL  Take 2 tablets by mouth every 8 hours as needed for Fever or Pain     ibuprofen 200 MG tablet  Commonly known as: Advil  Take 2 tablets by mouth every 8 hours as needed for Pain     lidocaine 4 % external patch  Place 1 patch onto the skin daily            ASK your doctor about these medications      ergocalciferol 1.25 MG (50000 UT) capsule  Commonly known as: ERGOCALCIFEROL     fenofibrate 48 MG tablet  Commonly known as: TRICOR     ferrous sulfate 325 (65 Fe) MG EC tablet  Commonly known as: FE TABS 325     naproxen 500 MG tablet  Commonly known as: NAPROSYN  Take 1 tablet by mouth 2 times daily (with meals)               Where to Get Your Medications        These medications were sent to [facility],  - [address] - P [phone] - F  [phone]  [address] [place] [address]      Phone: [phone]   acetaminophen 500 MG tablet  ibuprofen 200 MG tablet  lidocaine 4 % external patch           DISCONTINUED MEDICATIONS:  Discharge Medication List as of 10/08/2023  8:43 PM          I am the Primary Clinician of Record. Jaxn Chiquito Theotis Barrio, MD (electronically signed)    (Please note that parts of this dictation were completed with voice recognition software. Quite often unanticipated  grammatical, syntax, homophones, and other interpretive errors are inadvertently transcribed by the computer software. Please disregards these errors. Please excuse any errors that have escaped final proofreading.)     Rosalin Hawking, MD  10/09/23 [phone]

## 2023-10-08 NOTE — ED Notes (Signed)
 Code stroke initiated per Dr Mallie Mussel.

## 2023-10-08 NOTE — ED Notes (Signed)
 Pt returned from CT. In room on monitor. NAD

## 2023-10-09 ENCOUNTER — Telehealth: Payer: Self-pay

## 2023-10-09 LAB — EKG 12-LEAD
Atrial Rate: 96 {beats}/min
P Axis: 28 degrees
P-R Interval: 126 ms
Q-T Interval: 332 ms
QRS Duration: 78 ms
QTc Calculation (Bazett): 418 ms
R Axis: -4 degrees
T Axis: 24 degrees
Ventricular Rate: 95 {beats}/min

## 2023-10-09 MED ORDER — KETOROLAC TROMETHAMINE 30 MG/ML IJ SOLN
30 | Freq: Once | INTRAMUSCULAR | Status: AC
Start: 2023-10-09 — End: 2023-10-08
  Administered 2023-10-09: 01:00:00 15 mg via INTRAVENOUS

## 2023-10-09 MED ORDER — IBUPROFEN 200 MG PO TABS
200 | ORAL_TABLET | Freq: Three times a day (TID) | ORAL | 0 refills | Status: DC | PRN
Start: 2023-10-09 — End: 2024-03-21

## 2023-10-09 MED ORDER — ACETAMINOPHEN 500 MG PO TABS
500 | ORAL | Status: AC
Start: 2023-10-09 — End: 2023-10-08
  Administered 2023-10-09: 01:00:00 1000 mg via ORAL

## 2023-10-09 MED ORDER — ACETAMINOPHEN 500 MG PO TABS
500 | ORAL_TABLET | Freq: Three times a day (TID) | ORAL | 1 refills | Status: AC | PRN
Start: 2023-10-09 — End: ?

## 2023-10-09 MED ORDER — LIDOCAINE 4 % EX PTCH
4 | MEDICATED_PATCH | Freq: Every day | CUTANEOUS | 0 refills | Status: AC
Start: 2023-10-09 — End: 2023-11-07

## 2023-10-09 MED FILL — ACETAMINOPHEN EXTRA STRENGTH 500 MG PO TABS: 500 MG | ORAL | Qty: 2 | Fill #0

## 2023-10-09 MED FILL — KETOROLAC TROMETHAMINE 30 MG/ML IJ SOLN: 30 MG/ML | INTRAMUSCULAR | Qty: 1 | Fill #0

## 2023-10-09 NOTE — Telephone Encounter (Signed)
 Copied from CRM [id number]. Topic: General - Other >> Oct 09, 2023  1:55 PM Isabelle Course C wrote: Reason for CRM: Patient can not get any sleep on Trazadone. Patient needs another medication that will not disturb his sleep  Please see pt concern regarding sleep medication and current insomnia and advise

## 2023-10-10 ENCOUNTER — Other Ambulatory Visit: Payer: Self-pay | Admitting: Physician Assistant

## 2023-10-10 DIAGNOSIS — G479 Sleep disorder, unspecified: Secondary | ICD-10-CM

## 2023-10-10 MED ORDER — RAMELTEON 8 MG PO TABS: 8.0000 mg | ORAL_TABLET | Freq: Every day | ORAL | 1 refills | Status: DC

## 2023-10-31 ENCOUNTER — Telehealth: Payer: Self-pay | Admitting: *Deleted

## 2023-10-31 ENCOUNTER — Other Ambulatory Visit: Payer: Self-pay | Admitting: Physician Assistant

## 2023-10-31 MED ORDER — MIRTAZAPINE 15 MG PO TABS: 15.0000 mg | ORAL_TABLET | Freq: Every day | ORAL | 1 refills | Status: DC

## 2023-10-31 NOTE — Telephone Encounter (Signed)
 Copied from CRM [id number]. Topic: Clinical - Medication Question >> Oct 31, 2023 12:34 PM [place] M wrote: Reason for CRM: Patient wanting to know if he can go back to taking mirtazapine (REMERON) 15 MG tablet, he said the new one is NOT helping at all.

## 2023-11-12 ENCOUNTER — Other Ambulatory Visit: Payer: Self-pay | Admitting: Physician Assistant

## 2023-11-12 DIAGNOSIS — E1140 Type 2 diabetes mellitus with diabetic neuropathy, unspecified: Secondary | ICD-10-CM

## 2023-12-16 ENCOUNTER — Other Ambulatory Visit: Payer: Self-pay | Admitting: Vascular Surgery

## 2023-12-16 ENCOUNTER — Other Ambulatory Visit: Payer: Self-pay | Admitting: Physician Assistant

## 2023-12-16 DIAGNOSIS — G479 Sleep disorder, unspecified: Secondary | ICD-10-CM

## 2023-12-17 ENCOUNTER — Other Ambulatory Visit: Payer: Self-pay

## 2023-12-17 DIAGNOSIS — Z95828 Presence of other vascular implants and grafts: Secondary | ICD-10-CM

## 2023-12-17 DIAGNOSIS — I70222 Atherosclerosis of native arteries of extremities with rest pain, left leg: Secondary | ICD-10-CM

## 2023-12-17 DIAGNOSIS — I739 Peripheral vascular disease, unspecified: Secondary | ICD-10-CM

## 2023-12-17 MED ORDER — APIXABAN 5 MG PO TABS: 5.0000 mg | ORAL_TABLET | Freq: Two times a day (BID) | ORAL | 10 refills | Status: DC

## 2023-12-17 NOTE — Telephone Encounter (Signed)
 Please see pt refill request and advise if ok to refill

## 2024-01-02 ENCOUNTER — Ambulatory Visit: Payer: Managed Care, Other (non HMO) | Admitting: Physician Assistant

## 2024-01-02 ENCOUNTER — Telehealth: Payer: Self-pay

## 2024-01-02 ENCOUNTER — Other Ambulatory Visit (HOSPITAL_COMMUNITY): Payer: Self-pay

## 2024-01-02 VITALS — BP 114/78 | HR 95 | Temp 97.7°F | Ht 74.0 in | Wt 241.0 lb

## 2024-01-02 DIAGNOSIS — D6851 Activated protein C resistance: Secondary | ICD-10-CM | POA: Diagnosis not present

## 2024-01-02 DIAGNOSIS — Z7984 Long term (current) use of oral hypoglycemic drugs: Secondary | ICD-10-CM

## 2024-01-02 DIAGNOSIS — G479 Sleep disorder, unspecified: Secondary | ICD-10-CM

## 2024-01-02 DIAGNOSIS — E1140 Type 2 diabetes mellitus with diabetic neuropathy, unspecified: Secondary | ICD-10-CM

## 2024-01-02 DIAGNOSIS — Z87891 Personal history of nicotine dependence: Secondary | ICD-10-CM

## 2024-01-02 DIAGNOSIS — Z7985 Long-term (current) use of injectable non-insulin antidiabetic drugs: Secondary | ICD-10-CM

## 2024-01-02 DIAGNOSIS — I739 Peripheral vascular disease, unspecified: Secondary | ICD-10-CM | POA: Diagnosis not present

## 2024-01-02 DIAGNOSIS — I7409 Other arterial embolism and thrombosis of abdominal aorta: Secondary | ICD-10-CM | POA: Diagnosis not present

## 2024-01-02 LAB — LIPID PANEL
Cholesterol: 158 mg/dL (ref 0–200)
HDL: 35.1 mg/dL — ABNORMAL LOW (ref >39.00)
LDL Cholesterol: 69 mg/dL (ref 0–99)
NonHDL: 122.55
Total CHOL/HDL Ratio: 4
Triglycerides: 266 mg/dL — ABNORMAL HIGH (ref 0.0–149.0)
VLDL: 53.2 mg/dL — ABNORMAL HIGH (ref 0.0–40.0)

## 2024-01-02 LAB — POCT GLYCOSYLATED HEMOGLOBIN (HGB A1C): Hemoglobin A1C: 7.1 % — AB (ref 4.0–5.6)

## 2024-01-02 LAB — COMPREHENSIVE METABOLIC PANEL WITH GFR
ALT: 43 U/L (ref 0–53)
AST: 22 U/L (ref 0–37)
Albumin: 4.7 g/dL (ref 3.5–5.2)
Alkaline Phosphatase: 41 U/L (ref 39–117)
BUN: 12 mg/dL (ref 6–23)
CO2: 28 meq/L (ref 19–32)
Calcium: 9.3 mg/dL (ref 8.4–10.5)
Chloride: 104 meq/L (ref 96–112)
Creatinine, Ser: 1 mg/dL (ref 0.40–1.50)
GFR: 88.68 mL/min (ref >60.00)
Glucose, Bld: 150 mg/dL — ABNORMAL HIGH (ref 70–99)
Potassium: 4.5 meq/L (ref 3.5–5.1)
Sodium: 140 meq/L (ref 135–145)
Total Bilirubin: 1 mg/dL (ref 0.2–1.2)
Total Protein: 7.3 g/dL (ref 6.0–8.3)

## 2024-01-02 LAB — CBC WITH DIFFERENTIAL/PLATELET
Basophils Absolute: 0.1 10*3/uL (ref 0.0–0.1)
Basophils Relative: 0.8 % (ref 0.0–3.0)
Eosinophils Absolute: 0.4 10*3/uL (ref 0.0–0.7)
Eosinophils Relative: 4.3 % (ref 0.0–5.0)
HCT: 46.5 % (ref 39.0–52.0)
Hemoglobin: 16.2 g/dL (ref 13.0–17.0)
Lymphocytes Relative: 28.2 % (ref 12.0–46.0)
Lymphs Abs: 2.4 10*3/uL (ref 0.7–4.0)
MCHC: 34.9 g/dL (ref 30.0–36.0)
MCV: 89.7 fl (ref 78.0–100.0)
Monocytes Absolute: 0.8 10*3/uL (ref 0.1–1.0)
Monocytes Relative: 10.1 % (ref 3.0–12.0)
Neutro Abs: 4.7 10*3/uL (ref 1.4–7.7)
Neutrophils Relative %: 56.6 % (ref 43.0–77.0)
Platelets: 165 10*3/uL (ref 150.0–400.0)
RBC: 5.19 Mil/uL (ref 4.22–5.81)
RDW: 13.4 % (ref 11.5–15.5)
WBC: 8.4 10*3/uL (ref 4.0–10.5)

## 2024-01-02 MED ORDER — OZEMPIC (0.25 OR 0.5 MG/DOSE) 2 MG/3ML ~~LOC~~ SOPN
0.2500 mg | PEN_INJECTOR | SUBCUTANEOUS | 0 refills | Status: DC
Start: 2024-01-02 — End: 2024-02-10

## 2024-01-02 NOTE — Telephone Encounter (Signed)
 Contacted pt and educated concerning warfarin. A full discussion of the nature of anticoagulants has been carried out.  A benefit risk analysis has been presented to the patient, so that they understand the justification for choosing anticoagulation at this time. The need for frequent and regular monitoring, precise dosage adjustment and compliance is stressed.  Side effects of potential bleeding are discussed.  The patient should avoid any OTC items containing aspirin  or ibuprofen, and should avoid great swings in general diet.  Avoid alcohol consumption.  Call if any signs of abnormal bleeding.    Pt reported he is willing to use Lovenox injections to initiate warfarin dosing. He also reported he just refilled the Eliquis  so has another 30 day supply. He reports he is going out of town for a week at the end of the month. He would like to come to coumadin clinic for education the first week in July and initiate warfarin and Lovenox at that time.   Scheduled pt for coumadin clinic for 7/2 at [facility]. Provided direct number to coumadin clinic. Advised if any questions before apt to contact coumadin clinic. Will not send in warfarin or Lovenox until he has apt on 7/2.   Also sent mychart msg with phone number and further information, per pt request.

## 2024-01-02 NOTE — Telephone Encounter (Signed)
 Pharmacy Patient Advocate Encounter   Received notification from Onbase that prior authorization for Ozempic (0.25 or 0.5 MG/DOSE) 2MG /3ML pen-injectors is required/requested.   Insurance verification completed.   The patient is insured through [employer] .    Per test claim: PA required; PA submitted to above mentioned insurance via CoverMyMeds Key/confirmation #/EOC [id number] Status is pending

## 2024-01-02 NOTE — Telephone Encounter (Signed)
 Per PCP, Eliquis  has become cost prohibitive for the pt and he would like to transition to warfarin.   Best practice is to use a Lovenox bridge until pt is therapeutic.  Actual wt: 109.3 kg Ideal wt: 82 kg (actual 133% > ideal) Adjusted wt: 93 kg  CrCl: using adjusted wt due to actual wt > 125% of ideal wt; 117/54 mL/min  Can use once daily dosing with CrCl > 51mL/min  Recommend 150 mg daily lovenox injections and starting warfarin dose of 5 mg daily. Check INR one week after start.

## 2024-01-02 NOTE — Patient Instructions (Addendum)
 Coumadin Clinic Nurse: Ian Maine [phone]

## 2024-01-02 NOTE — Progress Notes (Signed)
 Patient ID: DILLON LIVERMORE, male    DOB: [birth year], 48 y.o.   MRN: [id number]   Assessment & Plan:  Type 2 diabetes mellitus with diabetic neuropathy, without long-term current use of insulin  (HCC) -     POCT glycosylated hemoglobin (Hb A1C) -     Ozempic (0.25 or 0.5 MG/DOSE); Inject 0.25 mg into the skin once a week.  Dispense: 3 mL; Refill: 0 -     CBC with Differential/Platelet -     Comprehensive metabolic panel with GFR -     Lipid panel  Factor V Leiden (HCC) -     CBC with Differential/Platelet -     Comprehensive metabolic panel with GFR -     Lipid panel  PAD (peripheral artery disease) (HCC) -     CBC with Differential/Platelet -     Comprehensive metabolic panel with GFR -     Lipid panel  Aortoiliac occlusive disease (HCC) -     CBC with Differential/Platelet -     Comprehensive metabolic panel with GFR -     Lipid panel  Difficulty sleeping  Former smoker     Assessment & Plan Type 2 Diabetes Mellitus Type 2 diabetes with current HbA1c of 7.1%, improved from 7.4% and 7.2%. Metformin  twice daily has contributed to this improvement. The goal is to reduce HbA1c to below 7%. Ozempic is considered for weight management and further glycemic control. Discussed potential side effects of Ozempic, including nausea, and the need to adjust metformin  if side effects occur. Explained that Ozempic increases insulin  sensitivity and reduces appetite. - Continue metformin  twice daily. - Initiate Ozempic with a starting dose, administered once weekly. Monitor for side effects such as nausea, especially if injected in the stomach. Adjust metformin  dosage if experiencing nausea or feeling unwell. - Reassess HbA1c and diabetes management in three months. Lab Results  Component Value Date   HGBA1C 7.1 (A) 01/02/2024   HGBA1C 7.2 (A) 09/04/2023   HGBA1C 7.4 (A) 04/24/2023     Factor V Leiden with Peripheral Artery Disease Factor V Leiden mutation with peripheral artery  disease, managed with Eliquis . He is considering switching to a more cost-effective anticoagulant due to the high cost of Eliquis . Warfarin is a potential alternative, but requires regular monitoring and dietary considerations. Discussed the need for INR monitoring and dietary restrictions with warfarin, and the potential use of Lovenox as a bridge therapy during transition. - Consult with Dr. Christia Cowboy regarding the potential switch from Eliquis  to warfarin. - Coordinate with the Coumadin clinic nurse manager, Cathleen Coach, to assess candidacy for warfarin and provide education on its management. - Consider using Lovenox as a bridge therapy if transitioning to warfarin.  Chronic Insomnia Chronic insomnia managed with Remeron  15 mg at bedtime, significantly improving sleep quality. He reports waking up only twice a night with medication, compared to every 15-20 minutes without it. He is not interested in pursuing a sleep study or using a CPAP machine for potential sleep apnea due to personal preference and perceived inability to tolerate CPAP. - Continue Remeron  15 mg at bedtime. - Consider halving the dose to 7.5 mg if needed, but maintain current regimen if effective.  General Health Maintenance Routine health maintenance requires updating, including cholesterol and kidney function monitoring. - Order cholesterol and kidney function tests.      Return in about 3 months (around 04/03/2024) for recheck/follow-up.    Subjective:    Chief Complaint  Patient presents with   Diabetes  Pt in today for diabetes follow up;     HPI Discussed the use of AI scribe software for clinical note transcription with the patient, who gave verbal consent to proceed.  History of Present Illness Bryan Hull is a 48 year old male with type 2 diabetes and chronic insomnia who presents for diabetes management and sleep issues.  His recent A1c is 7.1, improved from 7.4 eight months ago and 7.2 four months  ago. He was not aware he should have been taking his medication twice a day until three months ago, which he has since corrected. He is currently taking metformin  twice daily.  He has chronic insomnia for the last five years. He takes Remeron  15 mg at bedtime, which significantly improves his sleep, allowing him to wake up only twice a night compared to every 15-20 minutes without it. He has tried a lower dose but found it ineffective. He is not interested in pursuing a sleep study or using a CPAP machine. Family history of sleep apnea.  He has a history of factor V Leiden and peripheral artery disease, for which he is on Eliquis . He mentions that he bleeds easily but not excessively. He is scheduled to see his specialist next week to discuss his medication.  He is a former smoker, having quit nearly two years ago, and he expresses pride in this achievement. No significant gastrointestinal issues or family history of pancreatic or thyroid  cancer.     Past Medical History:  Diagnosis Date   Asthma    as a child/teenager   Blood transfusion without reported diagnosis    SEPTEMBER   Clotting disorder (HCC)    FACTOR V   Diabetes mellitus without complication (HCC)    Factor V Leiden (HCC)    Family history of factor V Leiden mutation    Neuromuscular disorder (HCC)    NEUROPATHY   Peripheral vascular disease (HCC)    PAD    Past Surgical History:  Procedure Laterality Date   AORTA - BILATERAL FEMORAL ARTERY BYPASS GRAFT N/A 04/22/2022   Procedure: AORTOBIFEMORAL BYPASS GRAFT;  Surgeon: Kayla Part, MD;  Location: [facility];  Service: Vascular;  Laterality: N/A;   APPENDECTOMY     COLONOSCOPY     COLONOSCOPY WITH PROPOFOL  N/A 08/27/2022   Procedure: COLONOSCOPY WITH PROPOFOL ;  Surgeon: Albertina Hugger, MD;  Location: [facility];  Service: Gastroenterology;  Laterality: N/A;   ENDOSCOPIC MUCOSAL RESECTION  08/27/2022   Procedure: ENDOSCOPIC MUCOSAL RESECTION;  Surgeon: Albertina Hugger, MD;  Location: [facility];  Service: Gastroenterology;;   HEMOSTASIS CLIP PLACEMENT  08/27/2022   Procedure: HEMOSTASIS CLIP PLACEMENT;  Surgeon: Albertina Hugger, MD;  Location: [facility];  Service: Gastroenterology;;   POLYPECTOMY  08/27/2022   Procedure: POLYPECTOMY;  Surgeon: Albertina Hugger, MD;  Location: [facility];  Service: Gastroenterology;;   SUBMUCOSAL LIFTING INJECTION  08/27/2022   Procedure: SUBMUCOSAL LIFTING INJECTION;  Surgeon: Albertina Hugger, MD;  Location: [facility];  Service: Gastroenterology;;   WISDOM TOOTH EXTRACTION      Family History  Problem Relation Age of Onset   Colon polyps Mother    Factor V Leiden deficiency Mother    Fibromyalgia Mother    Diabetes Father    Hyperlipidemia Father    Heart attack Father        threw a blood clot when kidney's shut down   Breast cancer Maternal Aunt    Non-Hodgkin's lymphoma Paternal Aunt  Ovarian cancer Paternal Aunt    Stomach cancer Paternal Aunt    Colon cancer Neg Hx    Esophageal cancer Neg Hx    Rectal cancer Neg Hx    Crohn's disease Neg Hx    Ulcerative colitis Neg Hx     Social History   Tobacco Use   Smoking status: Former    Current packs/day: 0.25    Average packs/day: 0.3 packs/day for 30.6 years (7.7 ttl pk-yrs)    Types: Cigarettes    Start date: 05/21/1993   Smokeless tobacco: Never  Vaping Use   Vaping status: Some Days   Substances: THC  Substance Use Topics   Alcohol use: Yes    Comment: rare   Drug use: Yes    Types: Marijuana    Comment: smoked maraijuana last night 08/12/22     No Known Allergies  Review of Systems NEGATIVE UNLESS OTHERWISE INDICATED IN HPI      Objective:     BP 114/78 (BP Location: Right Arm, Patient Position: Sitting, Cuff Size: Normal)   Pulse 95   Temp 97.7 F (36.5 C) (Temporal)   Ht 6\' 2"  (1.88 m)   Wt 241 lb (109.3 kg)   SpO2 95%   BMI 30.94 kg/m   Wt Readings from Last 3 Encounters:  01/02/24 241 lb  (109.3 kg)  09/04/23 246 lb 3.2 oz (111.7 kg)  04/24/23 240 lb 3.2 oz (109 kg)    BP Readings from Last 3 Encounters:  01/02/24 114/78  09/04/23 108/72  04/24/23 130/86     Physical Exam Vitals and nursing note reviewed.  Constitutional:      Appearance: Normal appearance.  Eyes:     Extraocular Movements: Extraocular movements intact.     Conjunctiva/sclera: Conjunctivae normal.     Pupils: Pupils are equal, round, and reactive to light.  Cardiovascular:     Rate and Rhythm: Normal rate and regular rhythm.     Pulses: Normal pulses.  Pulmonary:     Effort: Pulmonary effort is normal.     Breath sounds: No wheezing or rhonchi.  Musculoskeletal:     Right lower leg: No edema.     Left lower leg: No edema.  Neurological:     Mental Status: He is alert.     Sensory: Sensory deficit (monofilament sensation absent bilateral feet and ankles until about mid-calf) present.  Psychiatric:        Mood and Affect: Mood normal.             Tequila Rottmann M Tonette Koehne, PA-C

## 2024-01-05 ENCOUNTER — Other Ambulatory Visit: Payer: Self-pay | Admitting: Physician Assistant

## 2024-01-05 ENCOUNTER — Ambulatory Visit: Payer: Self-pay | Admitting: Physician Assistant

## 2024-01-05 ENCOUNTER — Other Ambulatory Visit (HOSPITAL_COMMUNITY): Payer: Self-pay

## 2024-01-05 MED ORDER — EZETIMIBE 10 MG PO TABS: 10.0000 mg | ORAL_TABLET | Freq: Every day | ORAL | 2 refills | Status: DC

## 2024-01-05 NOTE — Telephone Encounter (Signed)
Patient aware of PA approval.

## 2024-01-05 NOTE — Telephone Encounter (Signed)
 Pharmacy Patient Advocate Encounter  Received notification from [facility] that Prior Authorization for Ozempic  has been APPROVED from 12/03/2023 to 01/01/2025. Unable to obtain price due to refill too soon rejection, last fill date 01/02/24 next available fill date06/23/25   PA #/Case ID/Reference #: [id number]

## 2024-01-05 NOTE — Telephone Encounter (Signed)
 Noted and agreed, thank you.

## 2024-01-07 NOTE — Progress Notes (Signed)
 OFFICE NOTE   HPI:  This is a 48 y.o. male who is s/p end to end ABF with inferior mesenteric vein ligation and right CFA endarterectomy on 04/22/2022 by Dr. Rosalva Comber for LLE CLI with rest pain.    He had palpable DP pulses bilaterally.  He was started on heparin  500U on the day of surgery due to factor V leiden trait and was started on Eliquis  prior to discharge home.    At his last clinic visit, Bryan Hull had some mild claudication symptoms in his hips, which have been present for years.    On exam today, he was doing well with no complaints.  He continues to work full-time, states that he has some cramping in his legs after standing for 12 to 14 hours a day.  This is intermittent.  Compliant with Eliquis , aspirin   No Known Allergies  Current Outpatient Medications  Medication Sig Dispense Refill   albuterol  (PROAIR  HFA) 108 (90 Base) MCG/ACT inhaler Inhale 2 puffs into the lungs every 6 (six) hours as needed for wheezing or shortness of breath. 1 each 2   apixaban  (ELIQUIS ) 5 MG TABS tablet Take 1 tablet (5 mg total) by mouth 2 (two) times daily. 60 tablet 10   Blood Glucose Monitoring Suppl (FREESTYLE LITE) w/Device KIT Please use as directed to check sugar twice daily. 1 kit 2   ezetimibe (ZETIA) 10 MG tablet Take 1 tablet (10 mg total) by mouth daily. 30 tablet 2   FREESTYLE LITE test strip 1 each by Other route 2 (two) times daily. 200 each 1   gabapentin  (NEURONTIN ) 300 MG capsule Take 1 capsule (300 mg total) by mouth 2 (two) times daily. Take for nerve pain. 180 capsule 1   Lancets (FREESTYLE) lancets      metFORMIN  (GLUCOPHAGE -XR) 500 MG 24 hr tablet TAKE ONE TABLET TWICE DAILY WITH MEAL(S) 60 tablet 2   mirtazapine  (REMERON ) 15 MG tablet Take 1 tablet (15 mg total) by mouth at bedtime. 90 tablet 1   Semaglutide ,0.25 or 0.5MG /DOS, (OZEMPIC , 0.25 OR 0.5 MG/DOSE,) 2 MG/3ML SOPN Inject 0.25 mg into the skin once a week. 3 mL 0   Current Facility-Administered Medications  Medication Dose  Route Frequency Provider Last Rate Last Admin   0.9 %  sodium chloride  infusion  500 mL Intravenous Once Danis, Cordelia Dessert, MD         ROS:  See HPI  Physical Exam:  Physical Exam Constitutional:      Appearance: Normal appearance.  HENT:     Nose: No congestion.   Cardiovascular:     Rate and Rhythm: Normal rate and regular rhythm.     Pulses: Normal pulses.  Pulmonary:     Effort: Pulmonary effort is normal.     Breath sounds: Normal breath sounds.  Abdominal:     General: Abdomen is flat.     Palpations: Abdomen is soft.     Hernia: A hernia is present.   Musculoskeletal:        General: Normal range of motion.     Cervical back: Normal range of motion.   Skin:    General: Skin is warm and dry.   Neurological:     General: No focal deficit present.     Mental Status: He is alert and oriented to person, place, and time.   Psychiatric:        Mood and Affect: Mood normal.      Imaging:     Assessment/Plan:  This  is a 48 y.o. male who is s/p: end to end ABF with inferior mesenteric vein ligation and right CFA endarterectomy on 04/22/2022 by Dr. Rosalva Comber for LLE CLI with rest pain.   On physical exam, at his last visit he had palpable femoral pulses, and palpable pulses in the feet.  ABI is normal, with no changes from prior No concern for flow-limiting stenosis within the aortobifemoral bypass graft on the arterial duplex. I asked that he continue his current medication regimen.   We discussed his small incisional hernia.  This is reducible.  No pain.  No significant change in size from last year.  I offered referral to general surgery for repair.  Bryan Hull stated he will call me should it worsen, but at this time he is not concerned about it.  We discussed that bowel can get stuck, leading to obstruction.  He is aware that if he has significant abdominal pain he needs to go to the emergency department immediately.  My plan is to see him in 1 years time   Kayla Part [facility] Office: [phone]

## 2024-01-08 ENCOUNTER — Ambulatory Visit (HOSPITAL_COMMUNITY)
Admission: RE | Admit: 2024-01-08 | Discharge: 2024-01-08 | Disposition: A | Discharge status: Home / Self Care | Source: Ambulatory Visit | Attending: Cardiology | Admitting: Cardiology

## 2024-01-08 ENCOUNTER — Ambulatory Visit: Attending: Vascular Surgery | Admitting: Vascular Surgery

## 2024-01-08 VITALS — BP 125/91 | HR 72 | Temp 98.1°F | Resp 18 | Ht 74.0 in | Wt 238.9 lb

## 2024-01-08 DIAGNOSIS — Z95828 Presence of other vascular implants and grafts: Secondary | ICD-10-CM | POA: Diagnosis not present

## 2024-01-08 DIAGNOSIS — I70222 Atherosclerosis of native arteries of extremities with rest pain, left leg: Secondary | ICD-10-CM

## 2024-01-08 DIAGNOSIS — I739 Peripheral vascular disease, unspecified: Secondary | ICD-10-CM | POA: Insufficient documentation

## 2024-01-08 LAB — VAS US ABI WITH/WO TBI
Left ABI: 1.13
Right ABI: 1.17

## 2024-01-26 NOTE — Telephone Encounter (Signed)
 Noted and agreed, thank you.

## 2024-01-26 NOTE — Patient Instructions (Signed)
 Pre visit review using our clinic review tool, if applicable. No additional management support is needed unless otherwise documented below in the visit note.  Stop taking Eliquis . Start taking warfarin. Take 1 tablet every evening. Start Lovenox injections tomorrow and continue one injection daily until next apt on 7/9.

## 2024-01-26 NOTE — Telephone Encounter (Signed)
 Contacted pt to inquire if he still wants to transition from Eliquis  to warfarin. Educated pt on differences between both medications. Pt would like to transition to warfarin due to cost. Reviewed pt will need to use Lovenox injections until therapeutic INR. Pt verbalized understanding. Reminded of date and time for coumadin clinic apt. Advised to continue Eliquis  until apt with coumadin clinic. Advised pt to take Eliquis  the morning of the apt. Advised pt will stop Eliquis  that evening and start warfarin that evening and then start Lovenox injections, Q24H the next morning. Pt verbalized understanding.

## 2024-01-28 ENCOUNTER — Ambulatory Visit (INDEPENDENT_AMBULATORY_CARE_PROVIDER_SITE_OTHER)

## 2024-01-28 DIAGNOSIS — Z7901 Long term (current) use of anticoagulants: Secondary | ICD-10-CM | POA: Diagnosis not present

## 2024-01-28 MED ORDER — WARFARIN SODIUM 5 MG PO TABS: 5.0000 mg | ORAL_TABLET | Freq: Every day | ORAL | 0 refills | Status: DC

## 2024-01-28 MED ORDER — ENOXAPARIN SODIUM 150 MG/ML IJ SOSY: 150.0000 mg | PREFILLED_SYRINGE | INTRAMUSCULAR | 0 refills | Status: DC

## 2024-01-28 NOTE — Progress Notes (Signed)
 Pt is transitioning from Eliquis  to warfarin because Eliquis  has become cost prohibitive. Pt is in today for education and will start warfarin this evening and Lovenox injections tomorrow morning. No POCT INR was obtained today A full discussion of the nature of anticoagulants has been carried out.  A benefit risk analysis has been presented to the patient, so that they understand the justification for choosing anticoagulation at this time. The need for frequent and regular monitoring, precise dosage adjustment and compliance is stressed.  Side effects of potential bleeding are discussed.  The patient should avoid any OTC items containing aspirin  or ibuprofen, and should avoid great swings in general diet.  Avoid alcohol consumption.  Call if any signs of abnormal bleeding.   Pt was provided with written educational material, websites and a pamphlet from the [employer].  All questions answered to patients satisfaction and pt verbalized understanding.  Stop taking Eliquis . Start taking warfarin. Take 1 tablet every evening. Start Lovenox injections tomorrow and continue one injection daily until next apt on 7/9.  Sent in Lovenox and warfarin to pt's preferred pharmacy. Pt was advised if any problems receiving the medications to contact the coumadin clinic. Pt verbalized understanding.

## 2024-02-04 ENCOUNTER — Ambulatory Visit

## 2024-02-04 DIAGNOSIS — Z7901 Long term (current) use of anticoagulants: Secondary | ICD-10-CM

## 2024-02-04 LAB — POCT INR: INR: 1.1 — AB (ref 2.0–3.0)

## 2024-02-04 MED ORDER — ENOXAPARIN SODIUM 150 MG/ML IJ SOSY: 150.0000 mg | PREFILLED_SYRINGE | INTRAMUSCULAR | 0 refills | Status: DC

## 2024-02-04 NOTE — Patient Instructions (Addendum)
 Pre visit review using our clinic review tool, if applicable. No additional management support is needed unless otherwise documented below in the visit note.  Continue Lovenox  injections. Increase dose today to take 1 1/2 tablets and then change weekly dose to take 1 1/2 tablets daily except take 1 tablet on Monday, Wednesday and Friday. Recheck in one week.

## 2024-02-04 NOTE — Progress Notes (Signed)
 Pt has transitioed from Eliquis  to warfarin because Eliquis  has become cost prohibitive. Pt started Lovenox  injections and warfarin one week ago. Continue Lovenox  injections. Increase dose today to take 1 1/2 tablets and then change weekly dose to take 1 1/2 tablets daily except take 1 tablet on Monday, Wednesday and Friday. Recheck in one week.  Pt denies any difficulties with Lovenox  injections. Advised if any s/s of abnormal bruising or bleeding to go to the ER. Pt verbalized understanding.   Sent in new warfarin script.

## 2024-02-09 ENCOUNTER — Other Ambulatory Visit: Payer: Self-pay | Admitting: Physician Assistant

## 2024-02-09 DIAGNOSIS — E1140 Type 2 diabetes mellitus with diabetic neuropathy, unspecified: Secondary | ICD-10-CM

## 2024-02-11 ENCOUNTER — Ambulatory Visit (INDEPENDENT_AMBULATORY_CARE_PROVIDER_SITE_OTHER)

## 2024-02-11 DIAGNOSIS — Z7901 Long term (current) use of anticoagulants: Secondary | ICD-10-CM | POA: Diagnosis not present

## 2024-02-11 LAB — POCT INR: INR: 1.4 — AB (ref 2.0–3.0)

## 2024-02-11 MED ORDER — ENOXAPARIN SODIUM 150 MG/ML IJ SOSY: 150.0000 mg | PREFILLED_SYRINGE | INTRAMUSCULAR | 0 refills | Status: DC

## 2024-02-11 MED ORDER — WARFARIN SODIUM 5 MG PO TABS: ORAL_TABLET | ORAL | 0 refills | Status: DC

## 2024-02-11 NOTE — Patient Instructions (Addendum)
 Pre visit review using our clinic review tool, if applicable. No additional management support is needed unless otherwise documented below in the visit note.  Continue Lovenox  injections. Increase dose today and tomorrow to take 2 tablets and then change weekly dose to take 1 1/2 tablets daily. Recheck in one week.

## 2024-02-11 NOTE — Progress Notes (Addendum)
 Pt has transitioned from Eliquis  to warfarin because Eliquis  has become cost prohibitive. Pt started Lovenox  injections and warfarin two week ago. Continue Lovenox  injections. Increase dose today and tomorrow to take 2 tablets and then change weekly dose to take 1 1/2 tablets daily. Recheck in one week.   Advised pt if any s/s of abnormal bruising or bleeding to go to ER.  Pt verbalized understanding.  Pt needs refill of warfarin and also lovenox . Sent in new scripts.

## 2024-02-18 ENCOUNTER — Ambulatory Visit

## 2024-02-18 DIAGNOSIS — Z7901 Long term (current) use of anticoagulants: Secondary | ICD-10-CM | POA: Diagnosis not present

## 2024-02-18 LAB — POCT INR: INR: 1.3 — AB (ref 2.0–3.0)

## 2024-02-18 NOTE — Patient Instructions (Addendum)
 Pre visit review using our clinic review tool, if applicable. No additional management support is needed unless otherwise documented below in the visit note.  Increase dose today to take 2 1/2 tablets and increase dose tomorrow to take 2 tablets and then change weekly dose to take 2 tablets daily except take 1 1/2 tablets on Monday, Wednesday and Friday. Recheck in one week.

## 2024-02-18 NOTE — Progress Notes (Signed)
 Pt has transitioned from Eliquis  to warfarin because Eliquis  has become cost prohibitive. Pt started Lovenox  injections and warfarin three weeks ago. Pt reports he does not want to continue Lovenox  injections due to cost. One week of injections are costing $100. Pt reports he has recently had other financial responsibilities and does not was to continue injections due to cost. Advised pt risk since he is not therapeutic with warfarin yet. Advised pt of s/s of a DVT, PE and stroke. Pt verbalized understanding and will decided he will not continue Lovenox  at this time. Pt's warfarin dose has been increased per protocol for INR result today. Increase dose today to take 2 1/2 tablets and increase dose tomorrow to take 2 tablets and then change weekly dose to take 2 tablets daily except take 1 1/2 tablets on Monday, Wednesday and Friday. Recheck in one week.

## 2024-02-22 ENCOUNTER — Other Ambulatory Visit: Payer: Self-pay | Admitting: Physician Assistant

## 2024-02-22 DIAGNOSIS — E1140 Type 2 diabetes mellitus with diabetic neuropathy, unspecified: Secondary | ICD-10-CM

## 2024-02-25 ENCOUNTER — Ambulatory Visit (INDEPENDENT_AMBULATORY_CARE_PROVIDER_SITE_OTHER)

## 2024-02-25 DIAGNOSIS — Z7901 Long term (current) use of anticoagulants: Secondary | ICD-10-CM | POA: Diagnosis not present

## 2024-02-25 LAB — POCT INR: INR: 1.2 — AB (ref 2.0–3.0)

## 2024-02-25 NOTE — Patient Instructions (Addendum)
 Pre visit review using our clinic review tool, if applicable. No additional management support is needed unless otherwise documented below in the visit note.  Increase dose today to take 3 tablets and increase dose tomorrow to take 3 tablets and then change weekly dose to take 2 tablets daily. Recheck in one week.

## 2024-02-25 NOTE — Progress Notes (Signed)
 Pt has transitioned from Eliquis  to warfarin because Eliquis  had become cost prohibitive. Pt started Lovenox  injections and warfarin four weeks ago. Pt reports he did not want to continue Lovenox  injections after last week due to cost. One week of injections are costing $100. Pt reports he has recently had other financial responsibilities and does not want to continue injections due to cost. Advised pt risk since he is not therapeutic with warfarin yet. Advised pt of s/s of a DVT, PE and stroke. Pt verbalized understanding and decided he will not continue Lovenox  at this time. Pt reports he missed warfarin dose 3 days ago.  Increase dose today to take 3 tablets and increase dose tomorrow to take 3 tablets and then change weekly dose to take 2 tablets daily. Recheck in one week.

## 2024-03-03 ENCOUNTER — Ambulatory Visit (INDEPENDENT_AMBULATORY_CARE_PROVIDER_SITE_OTHER)

## 2024-03-03 DIAGNOSIS — Z7901 Long term (current) use of anticoagulants: Secondary | ICD-10-CM

## 2024-03-03 LAB — POCT INR: INR: 2.2 (ref 2.0–3.0)

## 2024-03-03 NOTE — Progress Notes (Signed)
 Indication: Factor V Leiden This is the first week pt has reach therapeutic range. Will check in one week again and if doing well will spread next apt to 2 weeks.  Continue 2 tablets daily except take 3 tablets on Wednesday. Recheck in one week.  Advised to watch for s/s of abnormal bruising or bleeding and if any s/s to go to ER. Pt verbalized understanding.

## 2024-03-03 NOTE — Patient Instructions (Addendum)
 Pre visit review using our clinic review tool, if applicable. No additional management support is needed unless otherwise documented below in the visit note.  Continue 2 tablets daily except take 3 tablets on Wednesday. Recheck in one week.

## 2024-03-10 ENCOUNTER — Ambulatory Visit

## 2024-03-10 DIAGNOSIS — Z7901 Long term (current) use of anticoagulants: Secondary | ICD-10-CM | POA: Diagnosis not present

## 2024-03-10 LAB — POCT INR: INR: 2 (ref 2.0–3.0)

## 2024-03-10 NOTE — Patient Instructions (Addendum)
 Pre visit review using our clinic review tool, if applicable. No additional management support is needed unless otherwise documented below in the visit note.  Continue 2 tablets daily except take 3 tablets on Wednesday. Recheck in two week.

## 2024-03-10 NOTE — Progress Notes (Signed)
 Indication: Factor V Leiden Second week of therapeutic INRs. Continue 2 tablets daily except take 3 tablets on Wednesday. Recheck in two week.  Advised to watch for s/s of abnormal bruising or bleeding and if any s/s to go to ER. Pt verbalized understanding.

## 2024-03-15 ENCOUNTER — Telehealth: Payer: Self-pay

## 2024-03-15 DIAGNOSIS — Z7901 Long term (current) use of anticoagulants: Secondary | ICD-10-CM

## 2024-03-15 MED ORDER — WARFARIN SODIUM 5 MG PO TABS: ORAL_TABLET | ORAL | 0 refills | Status: DC

## 2024-03-15 NOTE — Telephone Encounter (Signed)
 Pt reports he only has enough warfarin for 3 days. His next INR check is next week.   Sent in refill for 30 day supply. May change tablet dose once INR is regulated.   Advised if any problems at the pharmacy to contact the coumadin  clinic. Pt verbalized understanding.

## 2024-03-16 ENCOUNTER — Other Ambulatory Visit: Payer: Self-pay | Admitting: Physician Assistant

## 2024-03-16 DIAGNOSIS — G629 Polyneuropathy, unspecified: Secondary | ICD-10-CM

## 2024-03-16 DIAGNOSIS — M25552 Pain in left hip: Secondary | ICD-10-CM

## 2024-03-16 NOTE — Telephone Encounter (Signed)
 Last Visit: 01/02/24  Next Visit: 03/24/24  Last Filled: 04/24/23  Quantity: 180 w/ 1 refill

## 2024-03-21 ENCOUNTER — Emergency Department: Admit: 2024-03-21 | Payer: 59

## 2024-03-21 ENCOUNTER — Inpatient Hospital Stay
Admit: 2024-03-21 | Discharge: 2024-03-21 | Disposition: A | Discharge status: Home / Self Care | Payer: 59 | Arrived: WI | Attending: Emergency Medicine

## 2024-03-21 DIAGNOSIS — S8392XA Sprain of unspecified site of left knee, initial encounter: Secondary | ICD-10-CM

## 2024-03-21 DIAGNOSIS — S86912A Strain of unspecified muscle(s) and tendon(s) at lower leg level, left leg, initial encounter: Principal | ICD-10-CM

## 2024-03-21 MED ORDER — IBUPROFEN 600 MG PO TABS
600 | ORAL_TABLET | Freq: Four times a day (QID) | ORAL | 0 refills | Status: AC | PRN
Start: 2024-03-21 — End: 2024-03-26

## 2024-03-21 MED ORDER — KETOROLAC TROMETHAMINE 30 MG/ML IJ SOLN
30 | INTRAMUSCULAR | Status: AC
Start: 2024-03-21 — End: 2024-03-21
  Administered 2024-03-21: 16:00:00 30 mg via INTRAMUSCULAR

## 2024-03-21 MED ORDER — PREDNISONE 20 MG PO TABS
20 | ORAL_TABLET | Freq: Every day | ORAL | 0 refills | Status: AC
Start: 2024-03-21 — End: 2024-03-26

## 2024-03-21 MED ORDER — STERILE WATER FOR INJECTION (MIXTURES ONLY)
125 | Freq: Once | INTRAMUSCULAR | Status: AC
Start: 2024-03-21 — End: 2024-03-21
  Administered 2024-03-21: 16:00:00 125 mg via INTRAMUSCULAR

## 2024-03-21 MED FILL — METHYLPREDNISOLONE SODIUM SUCC 125 MG IJ SOLR: 125 mg | INTRAMUSCULAR | Qty: 125

## 2024-03-21 MED FILL — KETOROLAC TROMETHAMINE 30 MG/ML IJ SOLN: 30 mg/mL | INTRAMUSCULAR | Qty: 1

## 2024-03-21 NOTE — ED Triage Notes (Signed)
 Patient reports left knee pain that is chronic same has been exacerbated by new job that requires more walking patient report wearing a knee brace and taking ibuprofen  for same with little relief patient reports xrays of same in past with no bone involvement causing pain

## 2024-03-21 NOTE — ED Provider Notes (Signed)
 [facility] EMERGENCY DEPT  EMERGENCY DEPARTMENT HISTORY AND PHYSICAL EXAM      Date: 03/21/2024  Patient Name: Corey Shah  MRN: [id number]  Birthdate: 06-27-1976  Date of evaluation: 03/21/2024  Provider: Shaunna Gibes, MD   Note Started: 11:01 AM EDT 03/21/24    HISTORY OF PRESENT ILLNESS     Chief Complaint   Patient presents with   . Knee Pain       History Provided By: Patient    HPI: Lundy Cozart is a 48 y.o. male     PAST MEDICAL HISTORY   Past Medical History:  Past Medical History:   Diagnosis Date   . High triglycerides    . Prediabetes        Past Surgical History:  Past Surgical History:   Procedure Laterality Date   . SPINAL FUSION      04/2023       Family History:  History reviewed. No pertinent family history.    Social History:  Social History     Tobacco Use   . Smoking status: Never   . Smokeless tobacco: Never   Vaping Use   . Vaping status: Never Used   Substance Use Topics   . Alcohol use: Never   . Drug use: Never       Allergies:  No Known Allergies    PCP: No primary care provider on file.    Current Meds:   No current facility-administered medications for this encounter.     Current Outpatient Medications   Medication Sig Dispense Refill   . fenofibrate (TRICOR) 48 MG tablet Take 1 tablet by mouth daily     . ergocalciferol (ERGOCALCIFEROL) 1.25 MG (50000 UT) capsule Take 1 capsule by mouth once a week     . ferrous sulfate (FE TABS 325) 325 (65 Fe) MG EC tablet Take 1 tablet by mouth every other day     . acetaminophen  (TYLENOL ) 500 MG tablet Take 2 tablets by mouth every 8 hours as needed for Fever or Pain 360 tablet 1   . ibuprofen  (ADVIL ) 200 MG tablet Take 2 tablets by mouth every 8 hours as needed for Pain 20 tablet 0   . naproxen  (NAPROSYN ) 500 MG tablet Take 1 tablet by mouth 2 times daily (with meals) (Patient not taking: Reported on 10/08/2023) 60 tablet 0       Social Determinants of Health:   Social Drivers of Health     Tobacco Use: Low Risk  (03/21/2024)    Patient History    .  Smoking Tobacco Use: Never    . Smokeless Tobacco Use: Never    . Passive Exposure: Not on file   Alcohol Use: Patient Declined (03/21/2024)    AUDIT-C    . Frequency of Alcohol Consumption: Patient declined    . Average Number of Drinks: Patient declined    . Frequency of Binge Drinking: Patient declined   Programmer, applications: Not on file   Food Insecurity: Not on file   Transportation Needs: Not on file   Physical Activity: Not on file   Stress: Not on file   Social Connections: Not on file   Intimate Partner Violence: Not on file   Depression: Not at risk (09/09/2022)    Received from [facility] ([facility]    PHQ-2    . PHQ Total Score: 0   Housing Stability: Not on file   Interpersonal Safety: Not At Risk (12/01/2022)    Interpersonal Safety Domain  Source: IP Abuse Screening    . Physical abuse: Denies    . Verbal abuse: Denies    . Emotional abuse: Denies    . Financial abuse: Denies    . Sexual abuse: Denies   Utilities: Not on file       PHYSICAL EXAM   Physical Exam  Vitals and nursing note reviewed.   Constitutional:       General: He is not in acute distress.     Appearance: Normal appearance. He is normal weight. He is not ill-appearing, toxic-appearing or diaphoretic.   HENT:      Head: Normocephalic and atraumatic.      Nose: Nose normal.      Mouth/Throat:      Mouth: Mucous membranes are moist.      Pharynx: No oropharyngeal exudate.   Eyes:      Extraocular Movements: Extraocular movements intact.      Conjunctiva/sclera: Conjunctivae normal.      Pupils: Pupils are equal, round, and reactive to light.   Cardiovascular:      Rate and Rhythm: Normal rate and regular rhythm.      Pulses: Normal pulses.      Heart sounds: Normal heart sounds.   Pulmonary:      Effort: Pulmonary effort is normal.      Breath sounds: Normal breath sounds.   Abdominal:      General: Bowel sounds are normal.      Palpations: Abdomen is soft.      Tenderness: There is no abdominal tenderness.   Musculoskeletal:          General: Swelling and tenderness present. No deformity or signs of injury.      Cervical back: Normal range of motion and neck supple.      Right knee: Normal.      Left knee: Swelling, effusion and bony tenderness present. No deformity or crepitus. Decreased range of motion. Tenderness present over the medial joint line and patellar tendon. Normal alignment.   Skin:     General: Skin is warm and dry.      Capillary Refill: Capillary refill takes less than 2 seconds.   Neurological:      General: No focal deficit present.      Mental Status: He is alert and oriented to person, place, and time.   Psychiatric:         Mood and Affect: Mood normal.         Behavior: Behavior normal.           SCREENINGS              LAB, EKG AND DIAGNOSTIC RESULTS   Labs:  No results found for this or any previous visit (from the past 12 hours).    EKG: Not Applicable     Radiologic Studies:  Non-plain film images such as CT, Ultrasound and MRI are read by the radiologist. Plain radiographic images are visualized and preliminarily interpreted by the ED Physician with the following findings: Xray Interpreted by me.  Shows no acute bony process to hip or knee joint    Interpretation per the Radiologist below, if available at the time of this note:  No orders to display        ED COURSE and DIFFERENTIAL DIAGNOSIS/MDM   CC/HPI Summary, DDx, ED Course, and Reassessment: 48 year old male presents complaint of left knee pain aggravated over the past 2 to 3 days, patient states he has had to do increased  walking while on his job, patient denies any recent blunt trauma    Records Reviewed (source and summary of external notes): Prior medical records and Nursing notes    Vitals:    Vitals:    03/21/24 0953   BP: (!) 150/86   Pulse: 88   Resp: 18   Temp: 97.2 F (36.2 C)   SpO2: 97%   Weight: 104.3 kg (230 lb)   Height: 1.727 m (5' 8)        ED COURSE    Toradol  IM for pain  Solu-Medrol  for inflammatory changes  Femur XR  Knee  XR    Rule out any acute bony process versus joint process    ED Course as of 03/25/24 2136   Thu Mar 25, 2024   2135 Patient neurovascularly intact after placement of knee brace [SB]      ED Course User Index  [SB] Benn-Thompson, Larrie Fraizer, MD     Disposition Considerations (Tests not done, Shared Decision Making, Pt Expectation of Test or Treatment.): 48 year old male presenting complaint of left knee pain, physical exam significant for swelling and tenderness, radiological studies negative for any acute bony process or joint dislocation, patient provided with crutches and knee brace on discharge and encouraged ice his knee when he gets home    Patient was given the following medications:  Medications - No data to display    CONSULTS: (Who and What was discussed)  None     Social Determinants affecting Dx or Tx: None    Smoking Cessation: Not Applicable    PROCEDURES   Unless otherwise noted above, none.  Procedures      CRITICAL CARE TIME   Patient does not meet Critical Care Time, 0 minutes    FINAL IMPRESSION   No diagnosis found.      DISPOSITION/PLAN   DISPOSITION               Discharge Note: The patient is stable for discharge home. The signs, symptoms, diagnosis, and discharge instructions have been discussed, understanding conveyed, and agreed upon. The patient is to follow up as recommended or return to ER should their symptoms worsen.      PATIENT REFERRED TO:  No follow-up provider specified.      DISCHARGE MEDICATIONS:     Medication List        ASK your doctor about these medications      acetaminophen  500 MG tablet  Commonly known as: TYLENOL   Take 2 tablets by mouth every 8 hours as needed for Fever or Pain     ergocalciferol 1.25 MG (50000 UT) capsule  Commonly known as: ERGOCALCIFEROL     fenofibrate 48 MG tablet  Commonly known as: TRICOR     ferrous sulfate 325 (65 Fe) MG EC tablet  Commonly known as: FE TABS 325     ibuprofen  200 MG tablet  Commonly known as: Advil   Take 2 tablets by mouth every  8 hours as needed for Pain     naproxen  500 MG tablet  Commonly known as: NAPROSYN   Take 1 tablet by mouth 2 times daily (with meals)                DISCONTINUED MEDICATIONS:  Current Discharge Medication List          I am the Primary Clinician of Record: Shaunna Gibes, MD (electronically signed)    (Please note that parts of this dictation were completed with voice recognition software. Quite often unanticipated  grammatical, syntax, homophones, and other interpretive errors are inadvertently transcribed by the computer software. Please disregards these errors. Please excuse any errors that have escaped final proofreading.)     Callie Sous, MD  03/25/24 2136

## 2024-03-23 ENCOUNTER — Other Ambulatory Visit: Payer: Self-pay | Admitting: Physician Assistant

## 2024-03-23 DIAGNOSIS — E1140 Type 2 diabetes mellitus with diabetic neuropathy, unspecified: Secondary | ICD-10-CM

## 2024-03-24 ENCOUNTER — Ambulatory Visit (INDEPENDENT_AMBULATORY_CARE_PROVIDER_SITE_OTHER)

## 2024-03-24 DIAGNOSIS — Z7901 Long term (current) use of anticoagulants: Secondary | ICD-10-CM

## 2024-03-24 LAB — POCT INR: INR: 2.1 (ref 2.0–3.0)

## 2024-03-24 NOTE — Patient Instructions (Addendum)
 Pre visit review using our clinic review tool, if applicable. No additional management support is needed unless otherwise documented below in the visit note.  Continue 2 tablets daily except take 3 tablets on Wednesday. Recheck in three week.

## 2024-03-24 NOTE — Progress Notes (Signed)
 Indication: Factor V Leiden Third week of therapeutic INRs. Continue 2 tablets daily except take 3 tablets on Wednesday. Recheck in three week.  Advised to watch for s/s of abnormal bruising or bleeding and if any s/s to go to ER. Pt verbalized understanding.  Also scheduled an OV for f/u with PCP the same day.

## 2024-04-08 ENCOUNTER — Ambulatory Visit: Admitting: Physician Assistant

## 2024-04-14 ENCOUNTER — Ambulatory Visit (INDEPENDENT_AMBULATORY_CARE_PROVIDER_SITE_OTHER)

## 2024-04-14 ENCOUNTER — Encounter: Payer: Self-pay | Admitting: Physician Assistant

## 2024-04-14 ENCOUNTER — Ambulatory Visit: Payer: Self-pay | Admitting: Physician Assistant

## 2024-04-14 ENCOUNTER — Ambulatory Visit: Admitting: Physician Assistant

## 2024-04-14 VITALS — BP 118/80 | HR 75 | Temp 97.7°F | Ht 74.0 in | Wt 231.4 lb

## 2024-04-14 DIAGNOSIS — I739 Peripheral vascular disease, unspecified: Secondary | ICD-10-CM

## 2024-04-14 DIAGNOSIS — E785 Hyperlipidemia, unspecified: Secondary | ICD-10-CM | POA: Diagnosis not present

## 2024-04-14 DIAGNOSIS — J4520 Mild intermittent asthma, uncomplicated: Secondary | ICD-10-CM

## 2024-04-14 DIAGNOSIS — Z789 Other specified health status: Secondary | ICD-10-CM | POA: Diagnosis not present

## 2024-04-14 DIAGNOSIS — E1140 Type 2 diabetes mellitus with diabetic neuropathy, unspecified: Secondary | ICD-10-CM | POA: Diagnosis not present

## 2024-04-14 DIAGNOSIS — E1169 Type 2 diabetes mellitus with other specified complication: Secondary | ICD-10-CM

## 2024-04-14 DIAGNOSIS — F5104 Psychophysiologic insomnia: Secondary | ICD-10-CM

## 2024-04-14 DIAGNOSIS — Z87891 Personal history of nicotine dependence: Secondary | ICD-10-CM

## 2024-04-14 DIAGNOSIS — E663 Overweight: Secondary | ICD-10-CM

## 2024-04-14 DIAGNOSIS — Z7901 Long term (current) use of anticoagulants: Secondary | ICD-10-CM

## 2024-04-14 DIAGNOSIS — Z23 Encounter for immunization: Secondary | ICD-10-CM

## 2024-04-14 DIAGNOSIS — D6851 Activated protein C resistance: Secondary | ICD-10-CM

## 2024-04-14 DIAGNOSIS — Z7985 Long-term (current) use of injectable non-insulin antidiabetic drugs: Secondary | ICD-10-CM

## 2024-04-14 LAB — LIPID PANEL
Cholesterol: 150 mg/dL (ref 0–200)
HDL: 39.5 mg/dL (ref >39.00)
LDL Cholesterol: 38 mg/dL (ref 0–99)
NonHDL: 110.18
Total CHOL/HDL Ratio: 4
Triglycerides: 362 mg/dL — ABNORMAL HIGH (ref 0.0–149.0)
VLDL: 72.4 mg/dL — ABNORMAL HIGH (ref 0.0–40.0)

## 2024-04-14 LAB — HEMOGLOBIN A1C: Hgb A1c MFr Bld: 6.6 % — ABNORMAL HIGH (ref 4.6–6.5)

## 2024-04-14 LAB — COMPREHENSIVE METABOLIC PANEL WITH GFR
ALT: 33 U/L (ref 0–53)
AST: 22 U/L (ref 0–37)
Albumin: 4.6 g/dL (ref 3.5–5.2)
Alkaline Phosphatase: 31 U/L — ABNORMAL LOW (ref 39–117)
BUN: 13 mg/dL (ref 6–23)
CO2: 22 meq/L (ref 19–32)
Calcium: 9.2 mg/dL (ref 8.4–10.5)
Chloride: 105 meq/L (ref 96–112)
Creatinine, Ser: 0.88 mg/dL (ref 0.40–1.50)
GFR: 101.12 mL/min (ref >60.00)
Glucose, Bld: 108 mg/dL — ABNORMAL HIGH (ref 70–99)
Potassium: 4.6 meq/L (ref 3.5–5.1)
Sodium: 138 meq/L (ref 135–145)
Total Bilirubin: 1.3 mg/dL — ABNORMAL HIGH (ref 0.2–1.2)
Total Protein: 7 g/dL (ref 6.0–8.3)

## 2024-04-14 LAB — MICROALBUMIN / CREATININE URINE RATIO
Creatinine,U: 73.9 mg/dL
Microalb Creat Ratio: UNDETERMINED mg/g (ref 0.0–30.0)
Microalb, Ur: <0.7 mg/dL

## 2024-04-14 LAB — POCT INR: INR: 2.5 (ref 2.0–3.0)

## 2024-04-14 LAB — VITAMIN B12: Vitamin B-12: 265 pg/mL (ref 211–911)

## 2024-04-14 MED ORDER — ALBUTEROL SULFATE HFA 108 (90 BASE) MCG/ACT IN AERS
2.0000 | INHALATION_SPRAY | Freq: Four times a day (QID) | RESPIRATORY_TRACT | 2 refills | Status: AC | PRN
Start: 2024-04-14 — End: ?

## 2024-04-14 MED ORDER — OZEMPIC (0.25 OR 0.5 MG/DOSE) 2 MG/3ML ~~LOC~~ SOPN: 0.5000 mg | PEN_INJECTOR | SUBCUTANEOUS | 2 refills | Status: DC

## 2024-04-14 MED ORDER — WARFARIN SODIUM 10 MG PO TABS: ORAL_TABLET | ORAL | 1 refills | Status: DC

## 2024-04-14 NOTE — Progress Notes (Signed)
 Patient ID: [name], male    DOB: 04-07-1976, 48 y.o.   MRN: [id number]   Assessment & Plan:  Type 2 diabetes mellitus with diabetic neuropathy, without long-term current use of insulin  (HCC) -     Hemoglobin A1c -     Lipid panel -     Microalbumin / creatinine urine ratio -     Vitamin B12 -     Comprehensive metabolic panel with GFR -     Ozempic  (0.25 or 0.5 MG/DOSE); Inject 0.5 mg into the skin every 7 (seven) days.  Dispense: 3 mL; Refill: 2  Immunization due -     Flu vaccine trivalent PF, 6mos and older(Flulaval,Afluria,Fluarix,Fluzone)  Former smoker -     Albuterol  Sulfate HFA; Inhale 2 puffs into the lungs every 6 (six) hours as needed for wheezing or shortness of breath.  Dispense: 1 each; Refill: 2  Statin intolerance  Chronic insomnia  Factor V Leiden (HCC)  PAD (peripheral artery disease) (HCC)  Overweight with body mass index (BMI) 25.0-29.9  Hyperlipidemia associated with type 2 diabetes mellitus (HCC)  Mild intermittent asthma without complication      Assessment and Plan Assessment & Plan Type 2 diabetes mellitus with diabetic neuropathy Type 2 diabetes mellitus with diabetic neuropathy, currently managed with Ozempic  and metformin . Recent A1c was 7.1. Reports fasting sugars around 140-145 mg/dL, indicating suboptimal control. Neuropathy symptoms include numbness in feet, leading to tripping over objects. - Increase Ozempic  to 0.5 mg weekly. - Continue metformin  XR 500 mg twice daily. - Order A1c and other labs today.  Obesity Obesity with a current weight of 231 lbs, down from 246 lbs in February. Goal weight is 215 lbs. Weight loss efforts include medication. - Increase Ozempic  to 0.5 mg weekly to aid in weight loss. - Encourage continued weight management efforts.  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 Previous treatment  with Eliquis  was ineffective, as evidenced by difficulty in stopping bleeding from cuts. - Continue Coumadin  therapy. - Coordinate with Coumadin  clinic for management.  Hyperlipidemia Hyperlipidemia managed with ezetimibe  due to intolerance to statins, which caused muscle aches. Currently taking ezetimibe  daily. - Continue ezetimibe  therapy. - Order cholesterol panel as part of today's labs.  Asthma Asthma with current use of a rescue inhaler. Reports occasional use due to environmental triggers such as smoke. No recent exacerbations reported. - Ensure availability of rescue inhaler for use as needed.  Chronic insomnia Chronic insomnia managed with Remeron  15 mg at night. Reports improved sleep with 6 solid hours per night, though previously achieved 8 hours. Without medication, experiences frequent awakenings every 15 minutes. - Continue Remeron  15 mg at night.      Return in about 3 months (around 07/14/2024) for recheck/follow-up.    Subjective:    Chief Complaint  Patient presents with   Diabetes    Pt in office for diabetes follow up and labs needed to monitor diabetes; pt concern is been on same dose of Ozempic  and no change in weight or glucose readings; glucose numbers stay in 120-140 range.     Diabetes   Discussed the use of AI scribe software for clinical note transcription with the patient, who gave verbal consent to proceed.  History of Present Illness Bryan Hull is a 48 year old male with diabetes and peripheral artery disease who presents for medication management and follow-up.  He has been using  Ozempic  at a dose of 0.25 mg but has not seen the desired effect on his blood sugar levels, which have been around 140-145 mg/dL. He is also on metformin  XR 500 mg twice daily. He has experienced weight loss, going from 246 lbs in February to 231 lbs currently, with a goal to reach 215 lbs. He attributes some weight gain to dietary choices over the weekend.  He  has a history of factor V Leiden with peripheral artery disease and was previously on Eliquis , which he felt was ineffective as he experienced difficulty stopping bleeding from cuts. He has since switched to Coumadin  and is working with a Coumadin  clinic.  He experiences chronic insomnia and takes Remeron  15 mg at night, which allows him to get six solid hours of sleep. Without it, he is up every fifteen minutes. He used to get eight hours of sleep but is satisfied with the current six hours.  He has a history of neuropathy, for which he takes gabapentin . He reports numbness in his feet, leading to tripping over things, but feels he is managing it well.  He has a history of asthma from childhood and uses a rescue inhaler as needed, especially when exposed to smoke or allergens. He reports a recent morning cough, which he attributes to seasonal changes.  He is a former smoker and has successfully quit smoking. He had a chest CT a couple of years ago, which was normal.  He is currently taking ezetimibe  for cholesterol management after experiencing muscle aches with statins.     Past Medical History:  Diagnosis Date   Asthma    as a child/teenager   Blood transfusion without reported diagnosis    SEPTEMBER   Clotting disorder (HCC)    FACTOR V   Diabetes mellitus without complication (HCC)    Factor V Leiden (HCC)    Family history of factor V Leiden mutation    Neuromuscular disorder (HCC)    NEUROPATHY   Peripheral vascular disease (HCC)    PAD    Past Surgical History:  Procedure Laterality Date   AORTA - BILATERAL FEMORAL ARTERY BYPASS GRAFT N/A 04/22/2022   Procedure: AORTOBIFEMORAL BYPASS GRAFT;  Surgeon: Lanis Fonda BRAVO, MD;  Location[facility] OR;  Service: Vascular;  Laterality: N/A;   APPENDECTOMY     COLONOSCOPY     COLONOSCOPY WITH PROPOFOL  N/A 08/27/2022   Procedure: COLONOSCOPY WITH PROPOFOL ;  Surgeon: Legrand Victory LITTIE DOUGLAS, MD;  Location: [facility];  Service:  Gastroenterology;  Laterality: N/A;   ENDOSCOPIC MUCOSAL RESECTION  08/27/2022   Procedure: ENDOSCOPIC MUCOSAL RESECTION;  Surgeon: Legrand Victory LITTIE DOUGLAS, MD;  Location: [facility];  Service: Gastroenterology;;   HEMOSTASIS CLIP PLACEMENT  08/27/2022   Procedure: HEMOSTASIS CLIP PLACEMENT;  Surgeon: Legrand Victory LITTIE DOUGLAS, MD;  Location: [facility];  Service: Gastroenterology;;   POLYPECTOMY  08/27/2022   Procedure: POLYPECTOMY;  Surgeon: Legrand Victory LITTIE DOUGLAS, MD;  Location: [facility];  Service: Gastroenterology;;   SUBMUCOSAL LIFTING INJECTION  08/27/2022   Procedure: SUBMUCOSAL LIFTING INJECTION;  Surgeon: Legrand Victory LITTIE DOUGLAS, MD;  Location: [facility];  Service: Gastroenterology;;   WISDOM TOOTH EXTRACTION      Family History  Problem Relation Age of Onset   Colon polyps Mother    Factor V Leiden deficiency Mother    Fibromyalgia Mother    Diabetes Father    Hyperlipidemia Father    Heart attack Father        threw a blood clot when kidney's  shut down   Breast cancer Maternal Aunt    Non-Hodgkin's lymphoma Paternal Aunt    Ovarian cancer Paternal Aunt    Stomach cancer Paternal Aunt    Colon cancer Neg Hx    Esophageal cancer Neg Hx    Rectal cancer Neg Hx    Crohn's disease Neg Hx    Ulcerative colitis Neg Hx     Social History   Tobacco Use   Smoking status: Former    Current packs/day: 0.25    Average packs/day: 0.3 packs/day for 30.9 years (7.7 ttl pk-yrs)    Types: Cigarettes    Start date: 05/21/1993   Smokeless tobacco: Never  Vaping Use   Vaping status: Some Days   Substances: THC  Substance Use Topics   Alcohol use: Yes    Comment: rare   Drug use: Yes    Types: Marijuana    Comment: smoked maraijuana last night 08/12/22     No Known Allergies  Review of Systems NEGATIVE UNLESS OTHERWISE INDICATED IN HPI      Objective:     BP 118/80 (BP Location: Left Arm, Patient Position: Sitting, Cuff Size: Normal)   Pulse 75   Temp 97.7 F (36.5 C)  (Temporal)   Ht 6' 2 (1.88 m)   Wt 231 lb 6.4 oz (105 kg)   SpO2 98%   BMI 29.71 kg/m   Wt Readings from Last 3 Encounters:  04/14/24 231 lb 6.4 oz (105 kg)  01/08/24 238 lb 14.4 oz (108.4 kg)  01/02/24 241 lb (109.3 kg)    BP Readings from Last 3 Encounters:  04/14/24 118/80  01/08/24 (!) 125/91  01/02/24 114/78     Physical Exam Vitals and nursing note reviewed.  Constitutional:      Appearance: Normal appearance.  Eyes:     Extraocular Movements: Extraocular movements intact.     Conjunctiva/sclera: Conjunctivae normal.     Pupils: Pupils are equal, round, and reactive to light.  Cardiovascular:     Rate and Rhythm: Normal rate and regular rhythm.     Pulses: Normal pulses.  Pulmonary:     Effort: Pulmonary effort is normal.     Breath sounds: No wheezing or rhonchi.  Musculoskeletal:     Right lower leg: No edema.     Left lower leg: No edema.  Neurological:     Mental Status: He is alert.     Sensory: Sensory deficit (monofilament sensation absent bilateral feet and ankles until about mid-calf) present.  Psychiatric:        Mood and Affect: Mood normal.             Rhyanna Sorce M Tawonda Legaspi, PA-C

## 2024-04-14 NOTE — Patient Instructions (Addendum)
 Pre visit review using our clinic review tool, if applicable. No additional management support is needed unless otherwise documented below in the visit note.  Continue 10 mg (1 tablet) daily except take 15 mg (1 1/2 tablet) on Wednesday. Recheck in 4 week.

## 2024-04-14 NOTE — Progress Notes (Signed)
 Indication: Factor V Leiden Pt also has apt with PCP today.  Pt is stable on current dose. Will change tablet dose to 10 mg so pt does not have to take as man pills. Educated pt on tablet strength and how to use compared to the 5 mg tablet dose. Pt verbalized understanding.  Sent in new 10 tablet dose. Continue 10 mg (1 tablet) daily except take 15 mg (1 1/2 tablet) on Wednesday. Recheck in 4 week.

## 2024-04-19 ENCOUNTER — Other Ambulatory Visit: Payer: Self-pay | Admitting: Physician Assistant

## 2024-05-12 ENCOUNTER — Ambulatory Visit

## 2024-05-12 DIAGNOSIS — Z7901 Long term (current) use of anticoagulants: Secondary | ICD-10-CM

## 2024-05-12 LAB — POCT INR: INR: 2.7 (ref 2.0–3.0)

## 2024-05-12 NOTE — Patient Instructions (Addendum)
 Pre visit review using our clinic review tool, if applicable. No additional management support is needed unless otherwise documented below in the visit note.  Continue 10 mg (1 tablet) daily except take 15 mg (1 1/2 tablet) on Wednesday. Recheck in 4 week.

## 2024-05-12 NOTE — Telephone Encounter (Signed)
 Please see pt response as Bryan Hull

## 2024-05-12 NOTE — Progress Notes (Signed)
 Indication: Factor V Leiden Continue 10 mg (1 tablet) daily except take 15 mg (1 1/2 tablet) on Wednesday. Recheck in 4 week.

## 2024-05-13 ENCOUNTER — Other Ambulatory Visit: Payer: Self-pay | Admitting: Physician Assistant

## 2024-05-19 ENCOUNTER — Other Ambulatory Visit: Payer: Self-pay | Admitting: Physician Assistant

## 2024-05-19 DIAGNOSIS — E1140 Type 2 diabetes mellitus with diabetic neuropathy, unspecified: Secondary | ICD-10-CM

## 2024-06-09 ENCOUNTER — Ambulatory Visit (INDEPENDENT_AMBULATORY_CARE_PROVIDER_SITE_OTHER)

## 2024-06-09 DIAGNOSIS — Z7901 Long term (current) use of anticoagulants: Secondary | ICD-10-CM | POA: Diagnosis not present

## 2024-06-09 LAB — POCT INR: INR: 1.8 — AB (ref 2.0–3.0)

## 2024-06-09 NOTE — Progress Notes (Addendum)
 Indication: Factor V Leiden  Increase dose today to take 2 tablets and then continue 10 mg (1 tablet) daily except take 15 mg (1 1/2 tablet) on WePt reports eating several servings of collard greens in the last week because they are fresh from the garden. Advised pt these foods need to be kept consistent in his diet. Pt verbalized understanding. dnesday. Recheck in 3 week.

## 2024-06-09 NOTE — Patient Instructions (Addendum)
 Pre visit review using our clinic review tool, if applicable. No additional management support is needed unless otherwise documented below in the visit note.  Increase dose today to take 2 tablets and then continue 10 mg (1 tablet) daily except take 15 mg (1 1/2 tablet) on Wednesday. Recheck in 3 week.

## 2024-06-30 ENCOUNTER — Ambulatory Visit

## 2024-06-30 DIAGNOSIS — Z7901 Long term (current) use of anticoagulants: Secondary | ICD-10-CM

## 2024-06-30 LAB — POCT INR: INR: 2.7 (ref 2.0–3.0)

## 2024-06-30 MED ORDER — WARFARIN SODIUM 10 MG PO TABS: ORAL_TABLET | ORAL | 1 refills | Status: AC

## 2024-06-30 NOTE — Patient Instructions (Addendum)
 Pre visit review using our clinic review tool, if applicable. No additional management support is needed unless otherwise documented below in the visit note.  Continue 10 mg (1 tablet) daily except take 15 mg (1 1/2 tablet) on Wednesday. Recheck in 5 week.

## 2024-06-30 NOTE — Progress Notes (Signed)
 Indication: Factor V Leiden Continue 10 mg (1 tablet) daily except take 15 mg (1 1/2 tablet) on Wednesday. Recheck in 5 week.

## 2024-07-19 ENCOUNTER — Encounter: Payer: Self-pay | Admitting: Physician Assistant

## 2024-07-19 ENCOUNTER — Ambulatory Visit: Payer: Self-pay | Admitting: Physician Assistant

## 2024-07-19 ENCOUNTER — Ambulatory Visit: Admitting: Physician Assistant

## 2024-07-19 VITALS — BP 124/84 | HR 90 | Temp 97.2°F | Ht 74.0 in | Wt 236.4 lb

## 2024-07-19 DIAGNOSIS — E538 Deficiency of other specified B group vitamins: Secondary | ICD-10-CM | POA: Diagnosis not present

## 2024-07-19 DIAGNOSIS — D6851 Activated protein C resistance: Secondary | ICD-10-CM | POA: Diagnosis not present

## 2024-07-19 DIAGNOSIS — E1140 Type 2 diabetes mellitus with diabetic neuropathy, unspecified: Secondary | ICD-10-CM

## 2024-07-19 DIAGNOSIS — Z7984 Long term (current) use of oral hypoglycemic drugs: Secondary | ICD-10-CM

## 2024-07-19 DIAGNOSIS — E1169 Type 2 diabetes mellitus with other specified complication: Secondary | ICD-10-CM

## 2024-07-19 DIAGNOSIS — Z7985 Long-term (current) use of injectable non-insulin antidiabetic drugs: Secondary | ICD-10-CM

## 2024-07-19 DIAGNOSIS — Z789 Other specified health status: Secondary | ICD-10-CM | POA: Diagnosis not present

## 2024-07-19 DIAGNOSIS — I739 Peripheral vascular disease, unspecified: Secondary | ICD-10-CM | POA: Diagnosis not present

## 2024-07-19 DIAGNOSIS — E785 Hyperlipidemia, unspecified: Secondary | ICD-10-CM | POA: Diagnosis not present

## 2024-07-19 LAB — HEMOGLOBIN A1C: Hgb A1c MFr Bld: 6.3 % (ref 4.6–6.5)

## 2024-07-19 MED ORDER — EZETIMIBE 10 MG PO TABS: 10.0000 mg | ORAL_TABLET | Freq: Every day | ORAL | 1 refills | Status: AC

## 2024-07-19 MED ORDER — METFORMIN HCL ER 500 MG PO TB24: 500.0000 mg | ORAL_TABLET | Freq: Two times a day (BID) | ORAL | 1 refills | Status: AC

## 2024-07-19 MED ORDER — SEMAGLUTIDE (1 MG/DOSE) 4 MG/3ML ~~LOC~~ SOPN: 1.0000 mg | PEN_INJECTOR | SUBCUTANEOUS | 2 refills | Status: AC

## 2024-07-19 NOTE — Progress Notes (Signed)
 "   Patient ID: Bryan Hull, male    DOB: [birth year], 48 y.o.   MRN: [id number]   Assessment & Plan:  Type 2 diabetes mellitus with diabetic neuropathy, without long-term current use of insulin  (HCC) -     Semaglutide  (1 MG/DOSE); Inject 1 mg as directed once a week.  Dispense: 3 mL; Refill: 2 -     metFORMIN  HCl ER; Take 1 tablet (500 mg total) by mouth 2 (two) times daily with a meal.  Dispense: 180 tablet; Refill: 1 -     Hemoglobin A1c  PAD (peripheral artery disease)  Factor V Leiden  Statin intolerance  Hyperlipidemia associated with type 2 diabetes mellitus (HCC)  B12 deficiency  Other orders -     Ezetimibe ; Take 1 tablet (10 mg total) by mouth daily.  Dispense: 90 tablet; Refill: 1     Assessment & Plan Type 2 diabetes mellitus complicated by diabetic neuropathy and hyperlipidemia Type 2 diabetes mellitus with neuropathy symptoms. Currently on Ozempic  0.5 mg weekly, increased from previous dose. Reports food cravings and weight fluctuations. No gastrointestinal side effects from Ozempic . Missed one dose but resumed the next night, which is acceptable. Hyperlipidemia managed with Zetia . - Sent Ozempic  prescription to pharmacy. - Sent Zetia  prescription to pharmacy. - Ordered A1c test at the lab. - Continue metformin  as prescribed. Lab Results  Component Value Date   HGBA1C 6.6 (H) 04/14/2024   HGBA1C 7.1 (A) 01/02/2024   HGBA1C 7.2 (A) 09/04/2023     Factor V Leiden Managed with warfarin to prevent thromboembolic events. Concern about potential discontinuation of medications due to financial constraints. - Continue warfarin therapy.  Vitamin B12 deficiency Previous level of 265, which is low normal. Symptoms include hip pain, possibly related to deficiency. No current supplementation. - Recommended starting a multivitamin containing B12.      Return in about 3 months (around 10/17/2024) for recheck/follow-up.    Subjective:    Chief Complaint   Patient presents with   Diabetes    Pt in office for diabetes follow up and A1C check; pt denied medical card and may lose his job and insurance if unable to get issue resolved. Pt states this may be his last visit; Pt wants to see what A1C is and discuss increasing Ozempic     Diabetes   Discussed the use of AI scribe software for clinical note transcription with the patient, who gave verbal consent to proceed.  History of Present Illness Bryan Hull is a 48 year old male with type two diabetes who presents for a three month follow-up visit.  He manages his type two diabetes with Ozempic , recently increased to 0.5 mg weekly, and metformin  taken twice daily. He missed a dose of Ozempic  last Friday and took it on Saturday instead. He experiences food cravings but no gastrointestinal issues with the medication. His weight has been fluctuating. He is also on Zetia  for cholesterol management.  He experiences neuropathy symptoms in his feet and legs, impacting his ability to work, as he cannot stand for long periods without experiencing cramps.   He has a history of B12 deficiency, with a previous level of 265, which is low normal. He has not started any B12 supplements. He reports hip and shoulder pain.  He is currently dealing with job-related stress due to a dispute over a [employer] medical qualification, which has affected his employment status. He is concerned about the potential need to stop medications if his financial situation worsens.  Past Medical History:  Diagnosis Date   Asthma    as a child/teenager   Blood transfusion without reported diagnosis    SEPTEMBER   Clotting disorder    FACTOR V   Diabetes mellitus without complication (HCC)    Factor V Leiden    Family history of factor V Leiden mutation    Neuromuscular disorder (HCC)    NEUROPATHY   Peripheral vascular disease    PAD    Past Surgical History:  Procedure Laterality Date   AORTA - BILATERAL FEMORAL  ARTERY BYPASS GRAFT N/A 04/22/2022   Procedure: AORTOBIFEMORAL BYPASS GRAFT;  Surgeon: Lanis Fonda BRAVO, MD;  Location: [facility] OR;  Service: Vascular;  Laterality: N/A;   APPENDECTOMY     COLONOSCOPY     COLONOSCOPY WITH PROPOFOL  N/A 08/27/2022   Procedure: COLONOSCOPY WITH PROPOFOL ;  Surgeon: Legrand Victory LITTIE DOUGLAS, MD;  Location: [facility];  Service: Gastroenterology;  Laterality: N/A;   ENDOSCOPIC MUCOSAL RESECTION  08/27/2022   Procedure: ENDOSCOPIC MUCOSAL RESECTION;  Surgeon: Legrand Victory LITTIE DOUGLAS, MD;  Location: [facility];  Service: Gastroenterology;;   HEMOSTASIS CLIP PLACEMENT  08/27/2022   Procedure: HEMOSTASIS CLIP PLACEMENT;  Surgeon: Legrand Victory LITTIE DOUGLAS, MD;  Location: [facility];  Service: Gastroenterology;;   POLYPECTOMY  08/27/2022   Procedure: POLYPECTOMY;  Surgeon: Legrand Victory LITTIE DOUGLAS, MD;  Location: [facility];  Service: Gastroenterology;;   SUBMUCOSAL LIFTING INJECTION  08/27/2022   Procedure: SUBMUCOSAL LIFTING INJECTION;  Surgeon: Legrand Victory LITTIE DOUGLAS, MD;  Location: [facility];  Service: Gastroenterology;;   WISDOM TOOTH EXTRACTION      Family History  Problem Relation Age of Onset   Colon polyps Mother    Factor V Leiden deficiency Mother    Fibromyalgia Mother    Diabetes Father    Hyperlipidemia Father    Heart attack Father        threw a blood clot when kidney's shut down   Breast cancer Maternal Aunt    Non-Hodgkin's lymphoma Paternal Aunt    Ovarian cancer Paternal Aunt    Stomach cancer Paternal Aunt    Colon cancer Neg Hx    Esophageal cancer Neg Hx    Rectal cancer Neg Hx    Crohn's disease Neg Hx    Ulcerative colitis Neg Hx     Social History[1]   Allergies[1]  Review of Systems NEGATIVE UNLESS OTHERWISE INDICATED IN HPI      Objective:     BP 124/84 (BP Location: Left Arm, Patient Position: Sitting, Cuff Size: Normal)   Pulse 90   Temp (!) 97.2 F (36.2 C) (Temporal)   Ht 6' 2 (1.88 m)   Wt 236 lb 6.4 oz (107.2 kg)   SpO2 98%   BMI  30.35 kg/m   Wt Readings from Last 3 Encounters:  07/19/24 236 lb 6.4 oz (107.2 kg)  04/14/24 231 lb 6.4 oz (105 kg)  01/08/24 238 lb 14.4 oz (108.4 kg)    BP Readings from Last 3 Encounters:  07/19/24 124/84  04/14/24 118/80  01/08/24 (!) 125/91     Physical Exam Vitals and nursing note reviewed.  Constitutional:      Appearance: Normal appearance.  Eyes:     Extraocular Movements: Extraocular movements intact.     Conjunctiva/sclera: Conjunctivae normal.     Pupils: Pupils are equal, round, and reactive to light.  Cardiovascular:     Rate and Rhythm: Normal rate and regular rhythm.     Pulses: Normal pulses.  Pulmonary:  Effort: Pulmonary effort is normal.     Breath sounds: No wheezing or rhonchi.  Musculoskeletal:     Right lower leg: No edema.     Left lower leg: No edema.  Neurological:     Mental Status: He is alert.     Sensory: Sensory deficit (monofilament sensation absent bilateral feet and ankles until about mid-calf) present.  Psychiatric:        Mood and Affect: Mood normal.             Dayton Sherr M Brent Taillon, PA-C    [1]  Social History Tobacco Use   Smoking status: Former    Current packs/day: 0.25    Average packs/day: 0.3 packs/day for 31.2 years (7.8 ttl pk-yrs)    Types: Cigarettes    Start date: 05/21/1993   Smokeless tobacco: Never  Vaping Use   Vaping status: Some Days   Substances: THC  Substance Use Topics   Alcohol use: Yes    Comment: rare   Drug use: Yes    Types: Marijuana    Comment: smoked maraijuana last night 08/12/22  [1] No Known Allergies  "

## 2024-08-04 ENCOUNTER — Ambulatory Visit

## 2024-08-04 DIAGNOSIS — Z7901 Long term (current) use of anticoagulants: Secondary | ICD-10-CM | POA: Diagnosis not present

## 2024-08-04 LAB — POCT INR: INR: 1.9 — AB (ref 2.0–3.0)

## 2024-08-04 NOTE — Progress Notes (Signed)
 Indication: Factor V Leiden Pt reports eating a lot of collards lately. Educated pt on keeping vitamin K containing foods consistent in his diet. Pt verbalized understanding. Increase dose today to take 2 tablets and continue 10 mg (1 tablet) daily except take 15 mg (1 1/2 tablet) on Wednesday. Recheck in 4 week.

## 2024-08-04 NOTE — Patient Instructions (Addendum)
 Pre visit review using our clinic review tool, if applicable. No additional management support is needed unless otherwise documented below in the visit note.  Increase dose today to take 2 tablets and continue 10 mg (1 tablet) daily except take 15 mg (1 1/2 tablet) on Wednesday. Recheck in 4 week.

## 2024-08-30 ENCOUNTER — Telehealth: Payer: Self-pay

## 2024-09-01 ENCOUNTER — Ambulatory Visit

## 2024-09-08 ENCOUNTER — Ambulatory Visit

## 2024-10-21 ENCOUNTER — Ambulatory Visit: Admitting: Physician Assistant
